# Patient Record
Sex: Male | Born: 1935 | Race: Black or African American | Hispanic: No | Marital: Married | Smoking: Former smoker
Health system: Southern US, Community
[De-identification: ages and names within clinical notes are randomized; demographics above are authoritative.]

## PROBLEM LIST (undated history)

## (undated) DIAGNOSIS — C801 Malignant (primary) neoplasm, unspecified: Secondary | ICD-10-CM

## (undated) DIAGNOSIS — I10 Essential (primary) hypertension: Secondary | ICD-10-CM

## (undated) DIAGNOSIS — E7800 Pure hypercholesterolemia, unspecified: Secondary | ICD-10-CM

## (undated) HISTORY — PX: PROSTATECTOMY: SHX69

---

## 2013-02-19 ENCOUNTER — Emergency Department (HOSPITAL_COMMUNITY): Payer: Medicare Other

## 2013-02-19 ENCOUNTER — Emergency Department (HOSPITAL_COMMUNITY)
Admission: EM | Admit: 2013-02-19 | Discharge: 2013-02-19 | Disposition: A | Discharge status: Home / Self Care | Payer: Medicare Other | Attending: Emergency Medicine | Admitting: Emergency Medicine

## 2013-02-19 ENCOUNTER — Encounter (HOSPITAL_COMMUNITY): Payer: Self-pay | Admitting: *Deleted

## 2013-02-19 DIAGNOSIS — Z862 Personal history of diseases of the blood and blood-forming organs and certain disorders involving the immune mechanism: Secondary | ICD-10-CM | POA: Insufficient documentation

## 2013-02-19 DIAGNOSIS — M545 Low back pain, unspecified: Secondary | ICD-10-CM | POA: Insufficient documentation

## 2013-02-19 DIAGNOSIS — M549 Dorsalgia, unspecified: Secondary | ICD-10-CM

## 2013-02-19 DIAGNOSIS — Z8639 Personal history of other endocrine, nutritional and metabolic disease: Secondary | ICD-10-CM | POA: Insufficient documentation

## 2013-02-19 DIAGNOSIS — R259 Unspecified abnormal involuntary movements: Secondary | ICD-10-CM | POA: Insufficient documentation

## 2013-02-19 DIAGNOSIS — IMO0001 Reserved for inherently not codable concepts without codable children: Secondary | ICD-10-CM | POA: Insufficient documentation

## 2013-02-19 DIAGNOSIS — Z87891 Personal history of nicotine dependence: Secondary | ICD-10-CM | POA: Insufficient documentation

## 2013-02-19 DIAGNOSIS — I10 Essential (primary) hypertension: Secondary | ICD-10-CM | POA: Insufficient documentation

## 2013-02-19 DIAGNOSIS — Z8546 Personal history of malignant neoplasm of prostate: Secondary | ICD-10-CM | POA: Insufficient documentation

## 2013-02-19 HISTORY — DX: Essential (primary) hypertension: I10

## 2013-02-19 HISTORY — DX: Pure hypercholesterolemia, unspecified: E78.00

## 2013-02-19 HISTORY — DX: Malignant (primary) neoplasm, unspecified: C80.1

## 2013-02-19 LAB — CBC WITH DIFFERENTIAL/PLATELET
Basophils Absolute: 0 10*3/uL (ref 0.0–0.1)
Basophils Relative: 0 % (ref 0–1)
Eosinophils Relative: 3 % (ref 0–5)
HCT: 33.3 % — ABNORMAL LOW (ref 39.0–52.0)
Lymphocytes Relative: 25 % (ref 12–46)
MCHC: 32.1 g/dL (ref 30.0–36.0)
MCV: 82.8 fL (ref 78.0–100.0)
Monocytes Absolute: 0.3 10*3/uL (ref 0.1–1.0)
RDW: 14.9 % (ref 11.5–15.5)

## 2013-02-19 LAB — COMPREHENSIVE METABOLIC PANEL
AST: 21 U/L (ref 0–37)
CO2: 30 mEq/L (ref 19–32)
Calcium: 9.3 mg/dL (ref 8.4–10.5)
Creatinine, Ser: 1.37 mg/dL — ABNORMAL HIGH (ref 0.50–1.35)
GFR calc non Af Amer: 47 mL/min — ABNORMAL LOW (ref >90)

## 2013-02-19 MED ORDER — TRAMADOL HCL 50 MG PO TABS: 50.0000 mg | ORAL_TABLET | Freq: Four times a day (QID) | ORAL | Status: DC | PRN

## 2013-02-19 NOTE — ED Notes (Signed)
Pt c/o lower back pain that radiates down left leg that started a few days ago, denies any injury, states that he did fall yesterday due to his left leg "giving away".

## 2013-02-19 NOTE — ED Notes (Signed)
Dr Zammit at bedside,  

## 2013-02-19 NOTE — ED Provider Notes (Signed)
CSN: [id number]     Arrival date & time 02/19/13  1054 History  This chart was scribed for Benny Lennert, MD by Quintella Reichert, ED scribe.  This patient was seen in room APA07/APA07 and the patient's care was started at 11:29 AM.    Chief Complaint  Patient presents with  . Leg Pain    Patient is a 79 y.o. male presenting with leg pain. The history is provided by the patient. No language interpreter was used.  Leg Pain Location:  Leg Time since incident:  2 weeks Injury: no   Leg location:  L leg Pain details:    Severity:  Moderate Chronicity:  New Relieved by:  Acetaminophen (minimal) Ineffective treatments:  None tried Associated symptoms: no back pain and no fatigue     HPI Comments: Douglas Mckay is a 79 y.o. male with h/o HTN, hypercholesteremia and prostate cancer who presents to the Emergency Department complaining of 2 weeks of moderate left leg pain extending from the back of his hip down the posterior leg to the ankle. Pt denies any injuries that may have caused pain.  He denies back pain.  He is ambulatory and denies any difficulty walking.  He has been taking Tylenol which provides minimal temporary relief.  He saw his PCP 3 days ago and states he thinks he was given x-rays but he is not sure.  Pt also complains of mild tremor to the right hand that began one month ago.   PCP is Dr. Felecia Shelling   Past Medical History  Diagnosis Date  . Hypertension   . Hypercholesterolemia   . Cancer     prostate    Past Surgical History  Procedure Laterality Date  . Prostatectomy      History reviewed. No pertinent family history.   History  Substance Use Topics  . Smoking status: Former Games developer  . Smokeless tobacco: Not on file  . Alcohol Use: No     Review of Systems  Constitutional: Negative for appetite change and fatigue.  HENT: Negative for congestion, sinus pressure and ear discharge.   Eyes: Negative for discharge.  Respiratory: Negative for cough.    Cardiovascular: Negative for chest pain.  Gastrointestinal: Negative for abdominal pain and diarrhea.  Genitourinary: Negative for frequency and hematuria.  Musculoskeletal: Positive for myalgias and arthralgias. Negative for back pain.  Skin: Negative for rash.  Neurological: Negative for seizures and headaches.  Psychiatric/Behavioral: Negative for hallucinations.      Allergies  Review of patient's allergies indicates no known allergies.  Home Medications  No current outpatient prescriptions on file.  BP 132/79  Pulse 92  Temp(Src) 99 F (37.2 C) (Oral)  Resp 17  Ht 5\' 1"  (1.549 m)  Wt 129 lb (58.514 kg)  BMI 24.39 kg/m2  SpO2 100%  Physical Exam  Nursing note and vitals reviewed. Constitutional: He is oriented to person, place, and time. He appears well-developed.  HENT:  Head: Normocephalic.  Eyes: Conjunctivae and EOM are normal. No scleral icterus.  Neck: Neck supple. No thyromegaly present.  Cardiovascular: Normal rate and regular rhythm.  Exam reveals no gallop and no friction rub.   No murmur heard. Pulmonary/Chest: No stridor. He has no wheezes. He has no rales. He exhibits no tenderness.  Abdominal: He exhibits no distension. There is no tenderness. There is no rebound.  Musculoskeletal: Normal range of motion. He exhibits no edema.       Lumbar back: He exhibits tenderness.  Mild lumbar spine tenderness  Lymphadenopathy:    He has no cervical adenopathy.  Neurological: He is oriented to person, place, and time. He displays tremor. Coordination normal.  Minimal tremor to right hand  Skin: No rash noted. No erythema.  Psychiatric: He has a normal mood and affect. His behavior is normal.    ED Course  Procedures (including critical care time)  DIAGNOSTIC STUDIES: Oxygen Saturation is 100% on room air, normal by my interpretation.    COORDINATION OF CARE: 11:36 AM-Discussed treatment plan which includes imaging and labs with pt at bedside and pt  agreed to plan.    Labs Review Labs Reviewed  CBC WITH DIFFERENTIAL - Abnormal; Notable for the following:    WBC 3.6 (*)    RBC 4.02 (*)    Hemoglobin 10.7 (*)    HCT 33.3 (*)    All other components within normal limits  COMPREHENSIVE METABOLIC PANEL - Abnormal; Notable for the following:    Glucose, Bld 100 (*)    Creatinine, Ser 1.37 (*)    GFR calc non Af Amer 47 (*)    GFR calc Af Amer 55 (*)    All other components within normal limits    Imaging Review Dg Lumbar Spine Complete  02/19/2013   *RADIOLOGY REPORT*  Clinical Data: Pain without injury  LUMBAR SPINE - COMPLETE 4+ VIEW  Comparison: None  Findings: Five lumbar type vertebral bodies are well visualized. Vertebral body height is well-maintained.  No spondylolysis or spondylolisthesis is seen.  Disc space narrowing is noted at L2-3 with mild osteophytes.  No other focal abnormality is seen.  IMPRESSION: Mild degenerative change without acute abnormality.   Original Report Authenticated By: Alcide Clever, M.D.   Ct Head Wo Contrast  02/19/2013   *RADIOLOGY REPORT*  Clinical Data: Weakness  CT HEAD WITHOUT CONTRAST  Technique:  Contiguous axial images were obtained from the base of the skull through the vertex without contrast.  Comparison: None.  Findings: The bony calvarium is intact.  No findings to suggest acute hemorrhage, acute infarction or space-occupying mass lesion are noted.  IMPRESSION: No acute abnormality is noted.   Original Report Authenticated By: Alcide Clever, M.D.    MDM  No diagnosis found.  Back pain with sciatica   The chart was scribed for me under my direct supervision.  I personally performed the history, physical, and medical decision making and all procedures in the evaluation of this patient.Benny Lennert, MD 02/19/13 [phone]

## 2013-02-19 NOTE — ED Notes (Signed)
Pain in L leg began approximately 2 weeks ago.  Described as spasm type pain beginning a base of buttocks radiating to ankle.  No c/o lower back pain.  8/10 pain at worst.  Has taken Tylenol intermittently which helps minimally w/pain.  Fell yesterday in home after L leg "gave way".  Did not hit head, did hit back.

## 2013-07-24 ENCOUNTER — Inpatient Hospital Stay (HOSPITAL_COMMUNITY)
Admission: EM | Admit: 2013-07-24 | Discharge: 2013-08-04 | DRG: 388 | Disposition: A | Discharge status: Skilled Nursing Facility | Payer: Medicare Other | Attending: Internal Medicine | Admitting: Internal Medicine

## 2013-07-24 ENCOUNTER — Encounter (HOSPITAL_COMMUNITY): Payer: Self-pay | Admitting: Emergency Medicine

## 2013-07-24 DIAGNOSIS — K5900 Constipation, unspecified: Secondary | ICD-10-CM | POA: Diagnosis present

## 2013-07-24 DIAGNOSIS — G250 Essential tremor: Secondary | ICD-10-CM | POA: Diagnosis present

## 2013-07-24 DIAGNOSIS — Z8546 Personal history of malignant neoplasm of prostate: Secondary | ICD-10-CM

## 2013-07-24 DIAGNOSIS — E538 Deficiency of other specified B group vitamins: Secondary | ICD-10-CM | POA: Diagnosis present

## 2013-07-24 DIAGNOSIS — N179 Acute kidney failure, unspecified: Secondary | ICD-10-CM

## 2013-07-24 DIAGNOSIS — R061 Stridor: Secondary | ICD-10-CM | POA: Diagnosis not present

## 2013-07-24 DIAGNOSIS — I10 Essential (primary) hypertension: Secondary | ICD-10-CM | POA: Diagnosis present

## 2013-07-24 DIAGNOSIS — E7800 Pure hypercholesterolemia, unspecified: Secondary | ICD-10-CM | POA: Diagnosis present

## 2013-07-24 DIAGNOSIS — K922 Gastrointestinal hemorrhage, unspecified: Secondary | ICD-10-CM

## 2013-07-24 DIAGNOSIS — D649 Anemia, unspecified: Secondary | ICD-10-CM

## 2013-07-24 DIAGNOSIS — G92 Toxic encephalopathy: Secondary | ICD-10-CM | POA: Diagnosis not present

## 2013-07-24 DIAGNOSIS — Z8711 Personal history of peptic ulcer disease: Secondary | ICD-10-CM

## 2013-07-24 DIAGNOSIS — J9601 Acute respiratory failure with hypoxia: Secondary | ICD-10-CM

## 2013-07-24 DIAGNOSIS — G929 Unspecified toxic encephalopathy: Secondary | ICD-10-CM | POA: Diagnosis not present

## 2013-07-24 DIAGNOSIS — Z9079 Acquired absence of other genital organ(s): Secondary | ICD-10-CM

## 2013-07-24 DIAGNOSIS — T380X5A Adverse effect of glucocorticoids and synthetic analogues, initial encounter: Secondary | ICD-10-CM | POA: Diagnosis not present

## 2013-07-24 DIAGNOSIS — E860 Dehydration: Secondary | ICD-10-CM | POA: Diagnosis present

## 2013-07-24 DIAGNOSIS — J690 Pneumonitis due to inhalation of food and vomit: Secondary | ICD-10-CM

## 2013-07-24 DIAGNOSIS — J9600 Acute respiratory failure, unspecified whether with hypoxia or hypercapnia: Secondary | ICD-10-CM | POA: Diagnosis not present

## 2013-07-24 DIAGNOSIS — Z823 Family history of stroke: Secondary | ICD-10-CM

## 2013-07-24 DIAGNOSIS — K5669 Other intestinal obstruction: Secondary | ICD-10-CM

## 2013-07-24 DIAGNOSIS — K565 Intestinal adhesions [bands], unspecified as to partial versus complete obstruction: Principal | ICD-10-CM | POA: Diagnosis present

## 2013-07-24 DIAGNOSIS — Z87891 Personal history of nicotine dependence: Secondary | ICD-10-CM

## 2013-07-24 DIAGNOSIS — K56609 Unspecified intestinal obstruction, unspecified as to partial versus complete obstruction: Secondary | ICD-10-CM | POA: Diagnosis present

## 2013-07-24 DIAGNOSIS — I959 Hypotension, unspecified: Secondary | ICD-10-CM | POA: Diagnosis not present

## 2013-07-24 DIAGNOSIS — E870 Hyperosmolality and hypernatremia: Secondary | ICD-10-CM | POA: Diagnosis not present

## 2013-07-24 DIAGNOSIS — G9340 Encephalopathy, unspecified: Secondary | ICD-10-CM

## 2013-07-24 DIAGNOSIS — G252 Other specified forms of tremor: Secondary | ICD-10-CM

## 2013-07-24 DIAGNOSIS — E785 Hyperlipidemia, unspecified: Secondary | ICD-10-CM | POA: Diagnosis present

## 2013-07-24 LAB — CBC WITH DIFFERENTIAL/PLATELET
BASOS ABS: 0 10*3/uL (ref 0.0–0.1)
BASOS PCT: 0 % (ref 0–1)
EOS ABS: 0 10*3/uL (ref 0.0–0.7)
EOS PCT: 0 % (ref 0–5)
HEMATOCRIT: 35.6 % — AB (ref 39.0–52.0)
HEMOGLOBIN: 11.5 g/dL — AB (ref 13.0–17.0)
Lymphocytes Relative: 26 % (ref 12–46)
Lymphs Abs: 0.8 10*3/uL (ref 0.7–4.0)
MCH: 27.3 pg (ref 26.0–34.0)
MCHC: 32.3 g/dL (ref 30.0–36.0)
MCV: 84.4 fL (ref 78.0–100.0)
MONO ABS: 0.2 10*3/uL (ref 0.1–1.0)
MONOS PCT: 6 % (ref 3–12)
Neutro Abs: 2 10*3/uL (ref 1.7–7.7)
Neutrophils Relative %: 68 % (ref 43–77)
Platelets: 188 10*3/uL (ref 150–400)
RBC: 4.22 MIL/uL (ref 4.22–5.81)
RDW: 14.8 % (ref 11.5–15.5)
WBC: 3 10*3/uL — ABNORMAL LOW (ref 4.0–10.5)

## 2013-07-24 LAB — LIPASE, BLOOD: LIPASE: 59 U/L (ref 11–59)

## 2013-07-24 LAB — COMPREHENSIVE METABOLIC PANEL
ALBUMIN: 4.2 g/dL (ref 3.5–5.2)
ALT: 19 U/L (ref 0–53)
AST: 26 U/L (ref 0–37)
Alkaline Phosphatase: 68 U/L (ref 39–117)
BILIRUBIN TOTAL: 0.2 mg/dL — AB (ref 0.3–1.2)
BUN: 29 mg/dL — AB (ref 6–23)
CALCIUM: 9.2 mg/dL (ref 8.4–10.5)
CO2: 30 mEq/L (ref 19–32)
CREATININE: 1.37 mg/dL — AB (ref 0.50–1.35)
Chloride: 102 mEq/L (ref 96–112)
GFR calc Af Amer: 55 mL/min — ABNORMAL LOW (ref >90)
GFR calc non Af Amer: 47 mL/min — ABNORMAL LOW (ref >90)
Glucose, Bld: 154 mg/dL — ABNORMAL HIGH (ref 70–99)
Potassium: 3.8 mEq/L (ref 3.7–5.3)
Sodium: 145 mEq/L (ref 137–147)
TOTAL PROTEIN: 7.7 g/dL (ref 6.0–8.3)

## 2013-07-24 LAB — LACTIC ACID, PLASMA: Lactic Acid, Venous: 2 mmol/L (ref 0.5–2.2)

## 2013-07-24 MED ORDER — SODIUM CHLORIDE 0.9 % IV SOLN
INTRAVENOUS | Status: DC
  Administered 2013-07-24: 1000 mL via INTRAVENOUS

## 2013-07-24 MED ORDER — FENTANYL CITRATE 0.05 MG/ML IJ SOLN
100.0000 ug | Freq: Once | INTRAMUSCULAR | Status: AC
  Administered 2013-07-24: 100 ug via INTRAVENOUS
  Filled 2013-07-24: qty 2

## 2013-07-24 NOTE — ED Notes (Addendum)
Pt c/o abdominal pain, headache, trembling, and both knees feel weak since Monday. Pt denies dysuria. Pts last BM was last Friday over a week ago.

## 2013-07-24 NOTE — ED Provider Notes (Addendum)
CSN: [id number]     Arrival date & time 07/24/13  2200 History  This chart was scribed for Wynetta Fines, MD by Maree Erie, ED Scribe. The patient was seen in room APA07/APA07. Patient's care was started at 11:09 PM.    Chief Complaint  Patient presents with  . Abdominal Pain    The history is provided by the patient. No language interpreter was used.    HPI Comments: Douglas Mckay is a 80 y.o. male who presents to the Emergency Department complaining of constant, diffuse abdominal pain that began yesterday but gradually worsened today. He characterizes the pain as aching. He denies any aggravating or alleviating factors. He reports intermittent nausea. He denies abdominal disten vomiting, diarrhea or fever. His last BM was two days ago. He denies similar instances of pain.      Past Medical History  Diagnosis Date  . Hypertension   . Hypercholesterolemia   . Cancer     prostate   Past Surgical History  Procedure Laterality Date  . Prostatectomy     History reviewed. No pertinent family history. History  Substance Use Topics  . Smoking status: Former Research scientist (life sciences)  . Smokeless tobacco: Not on file  . Alcohol Use: No    Review of Systems A complete 10 system review of systems was obtained and all systems are negative except as noted in the HPI and PMH.    Allergies  Review of patient's allergies indicates no known allergies.  Home Medications   Current Outpatient Rx  Name  Route  Sig  Dispense  Refill  . amLODipine-benazepril (LOTREL) 5-40 MG per capsule   Oral   Take 1 capsule by mouth daily.         . carvedilol (COREG) 3.125 MG tablet   Oral   Take 3.125 mg by mouth daily.         . simvastatin (ZOCOR) 20 MG tablet   Oral   Take 20 mg by mouth every evening.          Triage Vitals: BP 164/96  Pulse 92  Temp(Src) 98.4 F (36.9 C) (Oral)  Resp 20  Ht 5\' 1"  (1.549 m)  Wt 135 lb (61.236 kg)  BMI 25.52 kg/m2  SpO2 100%  Physical Exam  Nursing  note and vitals reviewed. General: Well-developed, well-nourished male in no acute distress; appearance consistent with age of record HENT: normocephalic; atraumatic; mucous membranes moist; wearing dentures; Eyes: pupils equal, round and reactive to light; extraocular muscles intact; arcus senilis bilaterally Neck: supple Heart: regular rate and rhythm; no murmurs, rubs or gallops Lungs: clear to auscultation bilaterally Abdomen: soft; no masses or hepatosplenomegaly; bowel sounds present; mild lower abdominal tenderness; slightly distended; bowel sounds present but intermittent Extremities: No deformity; full range of motion; pulses normal; no edema Neurologic: Awake, alert and oriented; motor function intact in all extremities and symmetric; no facial droop; occasional tremor of right hand Skin: Warm and dry Psychiatric: Normal mood and affect   ED Course  Procedures (including critical care time)  DIAGNOSTIC STUDIES: Oxygen Saturation is 100% on room air, normal by my interpretation.    COORDINATION OF CARE: 11:15 PM -Waiting on lab results to determine if CT abdomen will be with or without contrast. Patient verbalizes understanding and agrees with treatment plan.   MDM   Nursing notes and vitals signs, including pulse oximetry, reviewed.  Summary of this visit's results, reviewed by myself:  Labs:  Results for orders placed during the hospital encounter of  07/24/13 (from the past 24 hour(s))  LACTIC ACID, PLASMA     Status: None   Collection Time    07/24/13 10:55 PM      Result Value Range   Lactic Acid, Venous 2.0  0.5 - 2.2 mmol/L  CBC WITH DIFFERENTIAL     Status: Abnormal   Collection Time    07/24/13 10:55 PM      Result Value Range   WBC 3.0 (*) 4.0 - 10.5 K/uL   RBC 4.22  4.22 - 5.81 MIL/uL   Hemoglobin 11.5 (*) 13.0 - 17.0 g/dL   HCT 35.6 (*) 39.0 - 52.0 %   MCV 84.4  78.0 - 100.0 fL   MCH 27.3  26.0 - 34.0 pg   MCHC 32.3  30.0 - 36.0 g/dL   RDW 14.8   11.5 - 15.5 %   Platelets 188  150 - 400 K/uL   Neutrophils Relative % 68  43 - 77 %   Neutro Abs 2.0  1.7 - 7.7 K/uL   Lymphocytes Relative 26  12 - 46 %   Lymphs Abs 0.8  0.7 - 4.0 K/uL   Monocytes Relative 6  3 - 12 %   Monocytes Absolute 0.2  0.1 - 1.0 K/uL   Eosinophils Relative 0  0 - 5 %   Eosinophils Absolute 0.0  0.0 - 0.7 K/uL   Basophils Relative 0  0 - 1 %   Basophils Absolute 0.0  0.0 - 0.1 K/uL  COMPREHENSIVE METABOLIC PANEL     Status: Abnormal   Collection Time    07/24/13 10:55 PM      Result Value Range   Sodium 145  137 - 147 mEq/L   Potassium 3.8  3.7 - 5.3 mEq/L   Chloride 102  96 - 112 mEq/L   CO2 30  19 - 32 mEq/L   Glucose, Bld 154 (*) 70 - 99 mg/dL   BUN 29 (*) 6 - 23 mg/dL   Creatinine, Ser 1.37 (*) 0.50 - 1.35 mg/dL   Calcium 9.2  8.4 - 10.5 mg/dL   Total Protein 7.7  6.0 - 8.3 g/dL   Albumin 4.2  3.5 - 5.2 g/dL   AST 26  0 - 37 U/L   ALT 19  0 - 53 U/L   Alkaline Phosphatase 68  39 - 117 U/L   Total Bilirubin 0.2 (*) 0.3 - 1.2 mg/dL   GFR calc non Af Amer 47 (*) >90 mL/min   GFR calc Af Amer 55 (*) >90 mL/min  LIPASE, BLOOD     Status: None   Collection Time    07/24/13 10:55 PM      Result Value Range   Lipase 59  11 - 59 U/L    Imaging Studies: Ct Abdomen Pelvis W Contrast  07/25/2013   CLINICAL DATA:  New onset left lower abdominal pain. History of prostate cancer.  EXAM: CT ABDOMEN AND PELVIS WITH CONTRAST  TECHNIQUE: Multidetector CT imaging of the abdomen and pelvis was performed using the standard protocol following bolus administration of intravenous contrast.  CONTRAST:  51mL OMNIPAQUE IOHEXOL 300 MG/ML SOLN, 153mL OMNIPAQUE IOHEXOL 300 MG/ML SOLN  COMPARISON:  None available for comparison at time of study interpretation.  FINDINGS: Included view of the lung bases demonstrates mild dependent atelectasis. Included heart and pericardium are nonsuspicious.  Multiple loops of fluid distended small bowel measure up to 5.6 cm with transition  point in a right lower quadrant associated with a surgical  anastomotic line. No pneumatosis. Small amount of air and stool in the large bowel. Stomach is distended with debris and contrast.  Small amount of ascites without drainable fluid collections. No intraperitoneal free air. Mild mesenteric edema.  Subcentimeter hypodensities in the liver may reflect cysts, the liver is otherwise unremarkable. The spleen, adrenal glands, pancreas and gallbladder are nonsuspicious.  10 mm cyst at left interpolar kidney, too small to characterize hypodensities in the lower pole the kidneys bilaterally. No nephrolithiasis, hydronephrosis or renal masses. Great vessels are normal in course and caliber with moderate calcific atherosclerosis. Status post prostatectomy. Urinary bladder is partially distended with mild circumferential wall thickening.  Moderate right inguinal hernia containing fat and fluid. Mild lumbar levoscoliosis and degenerative change.  IMPRESSION: High-grade small bowel obstruction with transition point in the right lower quadrant associated with surgical anastomotic suture, this may reflect adhesions. No bowel perforation.  Small amount of ascites without drainable fluid collections.   Electronically Signed   By: Elon Alas   On: 07/25/2013 02:18   2:37 AM Dr. Arnoldo Morale will consult. He requests that the hospitalist admit the patient. We'll place an NG tube and make patient NPO.   I personally performed the services described in this documentation, which was scribed in my presence.  The recorded information has been reviewed and considered.   Wynetta Fines, MD 07/25/13 [place], MD 07/25/13 [phone]

## 2013-07-24 NOTE — ED Notes (Signed)
Wife states he had BM 2 days ago, had sudden onset of acute pain 2pm

## 2013-07-25 ENCOUNTER — Inpatient Hospital Stay (HOSPITAL_COMMUNITY): Payer: Medicare Other

## 2013-07-25 ENCOUNTER — Encounter (HOSPITAL_COMMUNITY): Payer: Self-pay | Admitting: Internal Medicine

## 2013-07-25 ENCOUNTER — Emergency Department (HOSPITAL_COMMUNITY): Payer: Medicare Other

## 2013-07-25 DIAGNOSIS — D649 Anemia, unspecified: Secondary | ICD-10-CM

## 2013-07-25 DIAGNOSIS — E7800 Pure hypercholesterolemia, unspecified: Secondary | ICD-10-CM

## 2013-07-25 DIAGNOSIS — K922 Gastrointestinal hemorrhage, unspecified: Secondary | ICD-10-CM

## 2013-07-25 DIAGNOSIS — R259 Unspecified abnormal involuntary movements: Secondary | ICD-10-CM

## 2013-07-25 DIAGNOSIS — I10 Essential (primary) hypertension: Secondary | ICD-10-CM

## 2013-07-25 DIAGNOSIS — E860 Dehydration: Secondary | ICD-10-CM

## 2013-07-25 DIAGNOSIS — K56609 Unspecified intestinal obstruction, unspecified as to partial versus complete obstruction: Secondary | ICD-10-CM

## 2013-07-25 LAB — URINALYSIS, ROUTINE W REFLEX MICROSCOPIC
Bilirubin Urine: NEGATIVE
Glucose, UA: NEGATIVE mg/dL
KETONES UR: NEGATIVE mg/dL
LEUKOCYTES UA: NEGATIVE
NITRITE: NEGATIVE
PH: 5.5 (ref 5.0–8.0)
Protein, ur: 30 mg/dL — AB
SPECIFIC GRAVITY, URINE: 1.025 (ref 1.005–1.030)
Urobilinogen, UA: 0.2 mg/dL (ref 0.0–1.0)

## 2013-07-25 LAB — CBC
HCT: 31.8 % — ABNORMAL LOW (ref 39.0–52.0)
HEMOGLOBIN: 10.2 g/dL — AB (ref 13.0–17.0)
MCH: 27.1 pg (ref 26.0–34.0)
MCHC: 32.1 g/dL (ref 30.0–36.0)
MCV: 84.4 fL (ref 78.0–100.0)
Platelets: 148 10*3/uL — ABNORMAL LOW (ref 150–400)
RBC: 3.77 MIL/uL — ABNORMAL LOW (ref 4.22–5.81)
RDW: 14.8 % (ref 11.5–15.5)
WBC: 3.9 10*3/uL — ABNORMAL LOW (ref 4.0–10.5)

## 2013-07-25 LAB — PROTIME-INR
INR: 1.12 (ref 0.00–1.49)
Prothrombin Time: 14.2 seconds (ref 11.6–15.2)

## 2013-07-25 LAB — URINE MICROSCOPIC-ADD ON

## 2013-07-25 LAB — SAMPLE TO BLOOD BANK

## 2013-07-25 LAB — MRSA PCR SCREENING: MRSA by PCR: NEGATIVE

## 2013-07-25 LAB — APTT: aPTT: 29 seconds (ref 24–37)

## 2013-07-25 MED ORDER — ACETAMINOPHEN 650 MG RE SUPP
650.0000 mg | Freq: Four times a day (QID) | RECTAL | Status: DC | PRN
  Administered 2013-07-29: 650 mg via RECTAL
  Filled 2013-07-25: qty 1

## 2013-07-25 MED ORDER — HYDRALAZINE HCL 20 MG/ML IJ SOLN
10.0000 mg | Freq: Four times a day (QID) | INTRAMUSCULAR | Status: DC | PRN
  Administered 2013-07-27: 10 mg via INTRAVENOUS
  Filled 2013-07-25: qty 1

## 2013-07-25 MED ORDER — IOHEXOL 300 MG/ML  SOLN
50.0000 mL | Freq: Once | INTRAMUSCULAR | Status: AC | PRN
  Administered 2013-07-25: 50 mL via ORAL

## 2013-07-25 MED ORDER — PANTOPRAZOLE SODIUM 40 MG IV SOLR
INTRAVENOUS | Status: AC
  Filled 2013-07-25: qty 80

## 2013-07-25 MED ORDER — SODIUM CHLORIDE 0.9 % IJ SOLN
3.0000 mL | Freq: Two times a day (BID) | INTRAMUSCULAR | Status: DC
  Administered 2013-07-25 – 2013-08-04 (×12): 3 mL via INTRAVENOUS

## 2013-07-25 MED ORDER — ACETAMINOPHEN 325 MG PO TABS
650.0000 mg | ORAL_TABLET | Freq: Four times a day (QID) | ORAL | Status: DC | PRN
  Administered 2013-07-27: 650 mg via ORAL
  Filled 2013-07-25: qty 2

## 2013-07-25 MED ORDER — LORAZEPAM 2 MG/ML IJ SOLN
0.5000 mg | Freq: Once | INTRAMUSCULAR | Status: AC
  Administered 2013-07-25: 0.5 mg via INTRAVENOUS

## 2013-07-25 MED ORDER — IOHEXOL 300 MG/ML  SOLN
100.0000 mL | Freq: Once | INTRAMUSCULAR | Status: AC | PRN
  Administered 2013-07-25: 100 mL via INTRAVENOUS

## 2013-07-25 MED ORDER — ONDANSETRON HCL 4 MG PO TABS: 4.0000 mg | ORAL_TABLET | Freq: Four times a day (QID) | ORAL | Status: DC | PRN

## 2013-07-25 MED ORDER — SODIUM CHLORIDE 0.9 % IV SOLN
80.0000 mg | Freq: Once | INTRAVENOUS | Status: AC
  Administered 2013-07-25: 80 mg via INTRAVENOUS
  Filled 2013-07-25: qty 80

## 2013-07-25 MED ORDER — DEXTROSE-NACL 5-0.45 % IV SOLN
INTRAVENOUS | Status: DC
Start: 2013-07-25 — End: 2013-07-30
  Administered 2013-07-25: 1000 mL via INTRAVENOUS

## 2013-07-25 MED ORDER — ALBUTEROL SULFATE (2.5 MG/3ML) 0.083% IN NEBU
2.5000 mg | INHALATION_SOLUTION | RESPIRATORY_TRACT | Status: DC | PRN
  Administered 2013-07-27 – 2013-07-31 (×5): 2.5 mg via RESPIRATORY_TRACT
  Filled 2013-07-25 (×5): qty 3

## 2013-07-25 MED ORDER — LORAZEPAM 2 MG/ML IJ SOLN
INTRAMUSCULAR | Status: AC
  Filled 2013-07-25: qty 1

## 2013-07-25 MED ORDER — FENTANYL CITRATE 0.05 MG/ML IJ SOLN
100.0000 ug | Freq: Once | INTRAMUSCULAR | Status: AC
  Administered 2013-07-25: 100 ug via INTRAVENOUS
  Filled 2013-07-25: qty 2

## 2013-07-25 MED ORDER — ONDANSETRON HCL 4 MG/2ML IJ SOLN: 4.0000 mg | Freq: Four times a day (QID) | INTRAMUSCULAR | Status: DC | PRN

## 2013-07-25 MED ORDER — HYDROMORPHONE HCL PF 1 MG/ML IJ SOLN
0.5000 mg | INTRAMUSCULAR | Status: DC | PRN
  Administered 2013-07-26 – 2013-07-27 (×2): 0.5 mg via INTRAVENOUS
  Filled 2013-07-25 (×2): qty 1

## 2013-07-25 MED ORDER — PANTOPRAZOLE SODIUM 40 MG IV SOLR
40.0000 mg | Freq: Two times a day (BID) | INTRAVENOUS | Status: DC
  Administered 2013-07-25 – 2013-07-29 (×8): 40 mg via INTRAVENOUS
  Filled 2013-07-25 (×8): qty 40

## 2013-07-25 NOTE — Care Management Note (Signed)
    Page 1 of 1   07/25/2013     2:51:10 PM   CARE MANAGEMENT NOTE 07/25/2013  Patient:  Douglas Mckay, Douglas Mckay   Account Number:  [id number]  Date Initiated:  07/25/2013  Documentation initiated by:  Theophilus Kinds  Subjective/Objective Assessment:   Pt admitted from home with SBO. Pt lives with his wife and will return home at discharge. Pt has been fairly independent with ADl's.     Action/Plan:   Will continue to follow for discharge planning needs.   Anticipated DC Date:  08/01/2013   Anticipated DC Plan:  [place]  CM consult      Choice offered to / List presented to:             Status of service:  Completed, signed off Medicare Important Message given?   (If response is "NO", the following Medicare IM given date fields will be blank) Date Medicare IM given:   Date Additional Medicare IM given:    Discharge Disposition:  HOME/SELF CARE  Per UR Regulation:    If discussed at Long Length of Stay Meetings, dates discussed:    Comments:  07/25/13 [place], RN BSN CM

## 2013-07-25 NOTE — Consult Note (Addendum)
Reason for Consult:GI bleed Referring Physician: Hospitalist services Binh Doten is an 80 y.o. male. (Wife is providing most of history). Primary Care Physician;  Dr Valerie Salts HPI: Admitted last night thru the ED. He apparently had abdominal and back pain.  There was no vomiting associated with his symptoms. He has had pain for about a week on and off.  No fever. Wife states he has had some abdominal distention. His last BM was 3 days ago and was small and black.  His appetite has been okay at home. There has been no weight loss.  Hx of hypertension for years. Hx of hyperlipidemia.  Per wife, patient has a remote hx of PUD. Hx of prostate cancer and underwent surgery 3 yrs ago at [facility].  In the ED, NG placed and he was noted to have dark blood in the tube. 500cc coffee ground emesis. This am, very little emesis in suction container: ? 2-3 cc.   Hx of prostate cancer.  Past Medical History  Diagnosis Date  . Hypertension   . Hypercholesterolemia   . Cancer     prostate    Past Surgical History  Procedure Laterality Date  . Prostatectomy      Family History  Problem Relation Age of Onset  . Stroke Mother     Social History:  reports that he has quit smoking. He does not have any smokeless tobacco history on file. He reports that he does not drink alcohol or use illicit drugs.  Allergies: No Known Allergies  Medications: I have reviewed the patient's current medications.  Results for orders placed during the hospital encounter of 07/24/13 (from the past 48 hour(s))  LACTIC ACID, PLASMA     Status: None   Collection Time    07/24/13 10:55 PM      Result Value Range   Lactic Acid, Venous 2.0  0.5 - 2.2 mmol/L  CBC WITH DIFFERENTIAL     Status: Abnormal   Collection Time    07/24/13 10:55 PM      Result Value Range   WBC 3.0 (*) 4.0 - 10.5 K/uL   RBC 4.22  4.22 - 5.81 MIL/uL   Hemoglobin 11.5 (*) 13.0 - 17.0 g/dL   HCT 35.6 (*) 39.0 - 52.0 %   MCV 84.4  78.0 - 100.0  fL   MCH 27.3  26.0 - 34.0 pg   MCHC 32.3  30.0 - 36.0 g/dL   RDW 14.8  11.5 - 15.5 %   Platelets 188  150 - 400 K/uL   Neutrophils Relative % 68  43 - 77 %   Neutro Abs 2.0  1.7 - 7.7 K/uL   Lymphocytes Relative 26  12 - 46 %   Lymphs Abs 0.8  0.7 - 4.0 K/uL   Monocytes Relative 6  3 - 12 %   Monocytes Absolute 0.2  0.1 - 1.0 K/uL   Eosinophils Relative 0  0 - 5 %   Eosinophils Absolute 0.0  0.0 - 0.7 K/uL   Basophils Relative 0  0 - 1 %   Basophils Absolute 0.0  0.0 - 0.1 K/uL  COMPREHENSIVE METABOLIC PANEL     Status: Abnormal   Collection Time    07/24/13 10:55 PM      Result Value Range   Sodium 145  137 - 147 mEq/L   Potassium 3.8  3.7 - 5.3 mEq/L   Chloride 102  96 - 112 mEq/L   CO2 30  19 - 32  mEq/L   Glucose, Bld 154 (*) 70 - 99 mg/dL   BUN 29 (*) 6 - 23 mg/dL   Creatinine, Ser 1.37 (*) 0.50 - 1.35 mg/dL   Calcium 9.2  8.4 - 10.5 mg/dL   Total Protein 7.7  6.0 - 8.3 g/dL   Albumin 4.2  3.5 - 5.2 g/dL   AST 26  0 - 37 U/L   ALT 19  0 - 53 U/L   Alkaline Phosphatase 68  39 - 117 U/L   Total Bilirubin 0.2 (*) 0.3 - 1.2 mg/dL   GFR calc non Af Amer 47 (*) >90 mL/min   GFR calc Af Amer 55 (*) >90 mL/min   Comment: (NOTE)     The eGFR has been calculated using the CKD EPI equation.     This calculation has not been validated in all clinical situations.     eGFR's persistently <90 mL/min signify possible Chronic Kidney     Disease.  LIPASE, BLOOD     Status: None   Collection Time    07/24/13 10:55 PM      Result Value Range   Lipase 59  11 - 59 U/L  URINALYSIS, ROUTINE W REFLEX MICROSCOPIC     Status: Abnormal   Collection Time    07/25/13  3:30 AM      Result Value Range   Color, Urine YELLOW  YELLOW   APPearance CLEAR  CLEAR   Specific Gravity, Urine 1.025  1.005 - 1.030   pH 5.5  5.0 - 8.0   Glucose, UA NEGATIVE  NEGATIVE mg/dL   Hgb urine dipstick TRACE (*) NEGATIVE   Bilirubin Urine NEGATIVE  NEGATIVE   Ketones, ur NEGATIVE  NEGATIVE mg/dL   Protein,  ur 30 (*) NEGATIVE mg/dL   Urobilinogen, UA 0.2  0.0 - 1.0 mg/dL   Nitrite NEGATIVE  NEGATIVE   Leukocytes, UA NEGATIVE  NEGATIVE  URINE MICROSCOPIC-ADD ON     Status: None   Collection Time    07/25/13  3:30 AM      Result Value Range   Squamous Epithelial / LPF RARE  RARE   WBC, UA 0-2  <3 WBC/hpf   RBC / HPF 0-2  <3 RBC/hpf   Bacteria, UA RARE  RARE  SAMPLE TO BLOOD BANK     Status: None   Collection Time    07/25/13  5:30 AM      Result Value Range   Blood Bank Specimen SAMPLE AVAILABLE FOR TESTING     Sample Expiration 07/26/2013    CBC     Status: Abnormal   Collection Time    07/25/13  5:37 AM      Result Value Range   WBC 3.9 (*) 4.0 - 10.5 K/uL   RBC 3.77 (*) 4.22 - 5.81 MIL/uL   Hemoglobin 10.2 (*) 13.0 - 17.0 g/dL   HCT 31.8 (*) 39.0 - 52.0 %   MCV 84.4  78.0 - 100.0 fL   MCH 27.1  26.0 - 34.0 pg   MCHC 32.1  30.0 - 36.0 g/dL   RDW 14.8  11.5 - 15.5 %   Platelets 148 (*) 150 - 400 K/uL  PROTIME-INR     Status: None   Collection Time    07/25/13  5:37 AM      Result Value Range   Prothrombin Time 14.2  11.6 - 15.2 seconds   INR 1.12  0.00 - 1.49  APTT     Status: None  Collection Time    07/25/13  5:37 AM      Result Value Range   aPTT 29  24 - 37 seconds    Ct Head Wo Contrast  07/25/2013   CLINICAL DATA:  Headache and tremors.  EXAM: CT HEAD WITHOUT CONTRAST  TECHNIQUE: Contiguous axial images were obtained from the base of the skull through the vertex without intravenous contrast.  COMPARISON:  02/19/2013  FINDINGS: Examination is limited by mild motion degradation and recent administration of intravenous contrast.  Skull and Sinuses:No significant abnormality.  Orbits: No acute abnormality.  Brain: No evidence of acute abnormality, such as acute infarction, hemorrhage, hydrocephalus, or mass lesion/mass effect. Unchanged symmetric thickening of the free edge of the tentorium.  IMPRESSION: Stable exam.  No acute intracranial abnormality.   Electronically  Signed   By: Jorje Guild M.D.   On: 07/25/2013 04:17   Ct Abdomen Pelvis W Contrast  07/25/2013   CLINICAL DATA:  New onset left lower abdominal pain. History of prostate cancer.  EXAM: CT ABDOMEN AND PELVIS WITH CONTRAST  TECHNIQUE: Multidetector CT imaging of the abdomen and pelvis was performed using the standard protocol following bolus administration of intravenous contrast.  CONTRAST:  51m OMNIPAQUE IOHEXOL 300 MG/ML SOLN, 1076mOMNIPAQUE IOHEXOL 300 MG/ML SOLN  COMPARISON:  None available for comparison at time of study interpretation.  FINDINGS: Included view of the lung bases demonstrates mild dependent atelectasis. Included heart and pericardium are nonsuspicious.  Multiple loops of fluid distended small bowel measure up to 5.6 cm with transition point in a right lower quadrant associated with a surgical anastomotic line. No pneumatosis. Small amount of air and stool in the large bowel. Stomach is distended with debris and contrast.  Small amount of ascites without drainable fluid collections. No intraperitoneal free air. Mild mesenteric edema.  Subcentimeter hypodensities in the liver may reflect cysts, the liver is otherwise unremarkable. The spleen, adrenal glands, pancreas and gallbladder are nonsuspicious.  10 mm cyst at left interpolar kidney, too small to characterize hypodensities in the lower pole the kidneys bilaterally. No nephrolithiasis, hydronephrosis or renal masses. Great vessels are normal in course and caliber with moderate calcific atherosclerosis. Status post prostatectomy. Urinary bladder is partially distended with mild circumferential wall thickening.  Moderate right inguinal hernia containing fat and fluid. Mild lumbar levoscoliosis and degenerative change.  IMPRESSION: High-grade small bowel obstruction with transition point in the right lower quadrant associated with surgical anastomotic suture, this may reflect adhesions. No bowel perforation.  Small amount of ascites  without drainable fluid collections.   Electronically Signed   By: CoElon Alas On: 07/25/2013 02:18    ROS Blood pressure 103/71, pulse 99, temperature 98.4 F (36.9 C), temperature source Axillary, resp. rate 10, height _0  (1.549 m), weight 117 lb 11.6 oz (53.4 kg), SpO2 100.00%. Physical Exam Alert. Skin warm and dry. Lungs are clear. HR regular. Abdomen is not tense. Slightly distended.  BS hyperactive. There is no pain on palpitation. No edema to lower extremities.  Assessment/Plan: Coffee ground return noted on placement of NG tube for decompression of small bowel obstruction; 500 cc in the ED. Very little this am.  He has dropped his Hemoglobin by 1gm.  I discussed with Dr. ReLaural Golden PUD or esophagitis needs to be ruled out. Will plan EGD tomorrow.  Agree with Protonix BID.  SETZER,TERRI W 07/25/2013, 8:17 AM     GI attending note; Patient interviewed and examined. Patient is [age]ear old African male who presents  with small bowel obstruction. NG tube was placed and emergency room with coffee-ground return. Patient denies nausea vomiting or heaving prior to coming to emergency room. He does give a remote history of peptic ulcer disease but he does not take any NSAIDs. Suspect upper GI bleed separate issue may be the result of secondary reflux disease due to small bowel obstruction. He does not appear to be actively bleeding. He will need diagnostic EGD during this hospitalization. Patient is by far also concerned about tremors to both hands which started about a month ago. Unenhanced head CT on admission negative for acute abnormalities.

## 2013-07-25 NOTE — Progress Notes (Addendum)
[facility] PROGRESS NOTE  Quinnton Bury CHE:[id number] DOB: Aug 26, 1931 DOA: 07/24/2013 PCP: Marjo Bicker, MD  Brief narrative: 80 y.o. male with a past medical history of hypertension, hypercholesterolemia, prostate cancer, status post prostatectomy in the past who presents to [facility] ED 07/24/2013 with ongoing worsening abdominal pain for one week prior to this admission. No complaints of nausea or vomiting. Patient was subsequently found to have high-grade small bowel obstruction based on CT abdomen. NG tube was inserted for decompression purposes. GI and surgery are assisting management.  Assessment/Plan:  Principal Problem:   SBO (small bowel obstruction) - Likely secondary to adhesions. - Continue conservative management with NG tube for decompression, IV fluids, antiemetics if needed - Appreciate surgery and GI following. Per surgery, intervention not required at this time Active Problems:   HTN (hypertension), benign - Reasonable inpatient control. Blood pressure 103/71   Hypercholesteremia - Off of statin therapy as patient is n.p.o.   Dehydration - Secondary to small bowel obstruction. Continue IV fluids   Code Status: Full code Family Communication: Family at the bedside, patient's wife Disposition Plan: Remains inpatient  Leisa Lenz, MD  [facility] Pager [phone]  If 7PM-7AM, please contact night-coverage [email or web address] Password [email or web address] 07/25/2013, 10:25 AM   LOS: 1 day   Consultants:  Surgery  Gastroenterology  Procedures:  NG tube placement on admission  Antibiotics:  None  HPI/Subjective: No acute overnight events.  Objective: Filed Vitals:   07/25/13 0618 07/25/13 0700 07/25/13 0800 07/25/13 0824  BP: 128/66 112/71 103/71   Pulse: 99     Temp: 98.4 F (36.9 C)   98.2 F (36.8 C)  TempSrc: Axillary   Axillary  Resp:  11 10   Height: 5\' 1"  (1.549 m)     Weight: 53.4 kg (117 lb 11.6 oz)     SpO2: 100%       Intake/Output  Summary (Last 24 hours) at 07/25/13 1025 Last data filed at 07/25/13 0454  Gross per 24 hour  Intake   1000 ml  Output    800 ml  Net    200 ml    Exam:   General:  Pt is alert, no acute distress  Cardiovascular: Regular rate and rhythm, S1/S2 appreciated  Respiratory: Clear to auscultation bilaterally, no wheezing, no crackles, no rhonchi  Abdomen: Firm, distended, and G-tube in place, appreciated bowel sounds   Extremities: No edema, pulses DP and PT palpable bilaterally  Neuro: Grossly nonfocal  Data Reviewed: Basic Metabolic Panel:  Recent Labs Lab 07/24/13 2255  NA 145  K 3.8  CL 102  CO2 30  GLUCOSE 154*  BUN 29*  CREATININE 1.37*  CALCIUM 9.2   Liver Function Tests:  Recent Labs Lab 07/24/13 2255  AST 26  ALT 19  ALKPHOS 68  BILITOT 0.2*  PROT 7.7  ALBUMIN 4.2    Recent Labs Lab 07/24/13 2255  LIPASE 59   No results found for this basename: AMMONIA,  in the last 168 hours CBC:  Recent Labs Lab 07/24/13 2255 07/25/13 0537  WBC 3.0* 3.9*  NEUTROABS 2.0  --   HGB 11.5* 10.2*  HCT 35.6* 31.8*  MCV 84.4 84.4  PLT 188 148*   Cardiac Enzymes: No results found for this basename: CKTOTAL, CKMB, CKMBINDEX, TROPONINI,  in the last 168 hours BNP: No components found with this basename: POCBNP,  CBG: No results found for this basename: GLUCAP,  in the last 168 hours  MRSA PCR SCREENING     Status: None  Collection Time    07/25/13  5:57 AM      Result Value Range Status   MRSA by PCR NEGATIVE  NEGATIVE Final     Studies: Ct Head Wo Contrast 07/25/2013    IMPRESSION: Stable exam.  No acute intracranial abnormality.    Ct Abdomen Pelvis W Contrast 07/25/2013     IMPRESSION: High-grade small bowel obstruction with transition point in the right lower quadrant associated with surgical anastomotic suture, this may reflect adhesions. No bowel perforation.  Small amount of ascites without drainable fluid collections.     Scheduled Meds: .  pantoprazole  40 mg Intravenous Q12H   Continuous Infusions: . dextrose 5 % and 0.45% NaCl 1,000 mL (07/25/13 0538)

## 2013-07-25 NOTE — Plan of Care (Signed)
Problem: Diagnosis - Type of Surgery Goal: General Surgical Patient Education (See Patient Education module for education specifics) Outcome: Progressing Admitted with small bowel obstruction  Problem: Consults Goal: Nutrition Consult-if indicated Outcome: Progressing NPO at present

## 2013-07-25 NOTE — Progress Notes (Signed)
UR chart review completed.  

## 2013-07-25 NOTE — ED Notes (Signed)
Please disregard O2 sats of 73%   Sat probe is off finger. Correct sats are 93% on r/a increased to 99% on 2ln/c

## 2013-07-25 NOTE — ED Notes (Signed)
Dr Maryland Pink in with pt, upgraded to stepdown bed.

## 2013-07-25 NOTE — Consult Note (Signed)
Reason for Consult: Small bowel obstruction Referring Physician: [facility]  Douglas Mckay is an 80 y.o. male.  HPI: Patient is a 80 year old black male who presented emergency room with a two-day history of worsening constipation. History is obtained from his wife as the patient is sedated from pain medication. She states he last had a bowel movement 2 days ago. He has never had this episode before. He usually receives care in [place]. He had a prostatectomy done in the room several years ago.  Past Medical History  Diagnosis Date  . Hypertension   . Hypercholesterolemia   . Cancer     prostate    Past Surgical History  Procedure Laterality Date  . Prostatectomy      Family History  Problem Relation Age of Onset  . Stroke Mother     Social History:  reports that he has quit smoking. He does not have any smokeless tobacco history on file. He reports that he does not drink alcohol or use illicit drugs.  Allergies: No Known Allergies  Medications: I have reviewed the patient's current medications.  Results for orders placed during the hospital encounter of 07/24/13 (from the past 48 hour(s))  LACTIC ACID, PLASMA     Status: None   Collection Time    07/24/13 10:55 PM      Result Value Range   Lactic Acid, Venous 2.0  0.5 - 2.2 mmol/L  CBC WITH DIFFERENTIAL     Status: Abnormal   Collection Time    07/24/13 10:55 PM      Result Value Range   WBC 3.0 (*) 4.0 - 10.5 K/uL   RBC 4.22  4.22 - 5.81 MIL/uL   Hemoglobin 11.5 (*) 13.0 - 17.0 g/dL   HCT 35.6 (*) 39.0 - 52.0 %   MCV 84.4  78.0 - 100.0 fL   MCH 27.3  26.0 - 34.0 pg   MCHC 32.3  30.0 - 36.0 g/dL   RDW 14.8  11.5 - 15.5 %   Platelets 188  150 - 400 K/uL   Neutrophils Relative % 68  43 - 77 %   Neutro Abs 2.0  1.7 - 7.7 K/uL   Lymphocytes Relative 26  12 - 46 %   Lymphs Abs 0.8  0.7 - 4.0 K/uL   Monocytes Relative 6  3 - 12 %   Monocytes Absolute 0.2  0.1 - 1.0 K/uL   Eosinophils Relative 0   0 - 5 %   Eosinophils Absolute 0.0  0.0 - 0.7 K/uL   Basophils Relative 0  0 - 1 %   Basophils Absolute 0.0  0.0 - 0.1 K/uL  COMPREHENSIVE METABOLIC PANEL     Status: Abnormal   Collection Time    07/24/13 10:55 PM      Result Value Range   Sodium 145  137 - 147 mEq/L   Potassium 3.8  3.7 - 5.3 mEq/L   Chloride 102  96 - 112 mEq/L   CO2 30  19 - 32 mEq/L   Glucose, Bld 154 (*) 70 - 99 mg/dL   BUN 29 (*) 6 - 23 mg/dL   Creatinine, Ser 1.37 (*) 0.50 - 1.35 mg/dL   Calcium 9.2  8.4 - 10.5 mg/dL   Total Protein 7.7  6.0 - 8.3 g/dL   Albumin 4.2  3.5 - 5.2 g/dL   AST 26  0 - 37 U/L   ALT 19  0 - 53 U/L   Alkaline Phosphatase 68  39 - 117 U/L   Total Bilirubin 0.2 (*) 0.3 - 1.2 mg/dL   GFR calc non Af Amer 47 (*) >90 mL/min   GFR calc Af Amer 55 (*) >90 mL/min   Comment: (NOTE)     The eGFR has been calculated using the CKD EPI equation.     This calculation has not been validated in all clinical situations.     eGFR's persistently <90 mL/min signify possible Chronic Kidney     Disease.  LIPASE, BLOOD     Status: None   Collection Time    07/24/13 10:55 PM      Result Value Range   Lipase 59  11 - 59 U/L  URINALYSIS, ROUTINE W REFLEX MICROSCOPIC     Status: Abnormal   Collection Time    07/25/13  3:30 AM      Result Value Range   Color, Urine YELLOW  YELLOW   APPearance CLEAR  CLEAR   Specific Gravity, Urine 1.025  1.005 - 1.030   pH 5.5  5.0 - 8.0   Glucose, UA NEGATIVE  NEGATIVE mg/dL   Hgb urine dipstick TRACE (*) NEGATIVE   Bilirubin Urine NEGATIVE  NEGATIVE   Ketones, ur NEGATIVE  NEGATIVE mg/dL   Protein, ur 30 (*) NEGATIVE mg/dL   Urobilinogen, UA 0.2  0.0 - 1.0 mg/dL   Nitrite NEGATIVE  NEGATIVE   Leukocytes, UA NEGATIVE  NEGATIVE  URINE MICROSCOPIC-ADD ON     Status: None   Collection Time    07/25/13  3:30 AM      Result Value Range   Squamous Epithelial / LPF RARE  RARE   WBC, UA 0-2  <3 WBC/hpf   RBC / HPF 0-2  <3 RBC/hpf   Bacteria, UA RARE  RARE   SAMPLE TO BLOOD BANK     Status: None   Collection Time    07/25/13  5:30 AM      Result Value Range   Blood Bank Specimen SAMPLE AVAILABLE FOR TESTING     Sample Expiration 07/26/2013    CBC     Status: Abnormal   Collection Time    07/25/13  5:37 AM      Result Value Range   WBC 3.9 (*) 4.0 - 10.5 K/uL   RBC 3.77 (*) 4.22 - 5.81 MIL/uL   Hemoglobin 10.2 (*) 13.0 - 17.0 g/dL   HCT 31.8 (*) 39.0 - 52.0 %   MCV 84.4  78.0 - 100.0 fL   MCH 27.1  26.0 - 34.0 pg   MCHC 32.1  30.0 - 36.0 g/dL   RDW 14.8  11.5 - 15.5 %   Platelets 148 (*) 150 - 400 K/uL  PROTIME-INR     Status: None   Collection Time    07/25/13  5:37 AM      Result Value Range   Prothrombin Time 14.2  11.6 - 15.2 seconds   INR 1.12  0.00 - 1.49  APTT     Status: None   Collection Time    07/25/13  5:37 AM      Result Value Range   aPTT 29  24 - 37 seconds  MRSA PCR SCREENING     Status: None   Collection Time    07/25/13  5:57 AM      Result Value Range   MRSA by PCR NEGATIVE  NEGATIVE   Comment:            The GeneXpert MRSA Assay (FDA  approved for NASAL specimens     only), is one component of a     comprehensive MRSA colonization     surveillance program. It is not     intended to diagnose MRSA     infection nor to guide or     monitor treatment for     MRSA infections.    Ct Head Wo Contrast  07/25/2013   CLINICAL DATA:  Headache and tremors.  EXAM: CT HEAD WITHOUT CONTRAST  TECHNIQUE: Contiguous axial images were obtained from the base of the skull through the vertex without intravenous contrast.  COMPARISON:  02/19/2013  FINDINGS: Examination is limited by mild motion degradation and recent administration of intravenous contrast.  Skull and Sinuses:No significant abnormality.  Orbits: No acute abnormality.  Brain: No evidence of acute abnormality, such as acute infarction, hemorrhage, hydrocephalus, or mass lesion/mass effect. Unchanged symmetric thickening of the free edge of the tentorium.   IMPRESSION: Stable exam.  No acute intracranial abnormality.   Electronically Signed   By: Jorje Guild M.D.   On: 07/25/2013 04:17   Ct Abdomen Pelvis W Contrast  07/25/2013   CLINICAL DATA:  New onset left lower abdominal pain. History of prostate cancer.  EXAM: CT ABDOMEN AND PELVIS WITH CONTRAST  TECHNIQUE: Multidetector CT imaging of the abdomen and pelvis was performed using the standard protocol following bolus administration of intravenous contrast.  CONTRAST:  71m OMNIPAQUE IOHEXOL 300 MG/ML SOLN, 1061mOMNIPAQUE IOHEXOL 300 MG/ML SOLN  COMPARISON:  None available for comparison at time of study interpretation.  FINDINGS: Included view of the lung bases demonstrates mild dependent atelectasis. Included heart and pericardium are nonsuspicious.  Multiple loops of fluid distended small bowel measure up to 5.6 cm with transition point in a right lower quadrant associated with a surgical anastomotic line. No pneumatosis. Small amount of air and stool in the large bowel. Stomach is distended with debris and contrast.  Small amount of ascites without drainable fluid collections. No intraperitoneal free air. Mild mesenteric edema.  Subcentimeter hypodensities in the liver may reflect cysts, the liver is otherwise unremarkable. The spleen, adrenal glands, pancreas and gallbladder are nonsuspicious.  10 mm cyst at left interpolar kidney, too small to characterize hypodensities in the lower pole the kidneys bilaterally. No nephrolithiasis, hydronephrosis or renal masses. Great vessels are normal in course and caliber with moderate calcific atherosclerosis. Status post prostatectomy. Urinary bladder is partially distended with mild circumferential wall thickening.  Moderate right inguinal hernia containing fat and fluid. Mild lumbar levoscoliosis and degenerative change.  IMPRESSION: High-grade small bowel obstruction with transition point in the right lower quadrant associated with surgical anastomotic suture,  this may reflect adhesions. No bowel perforation.  Small amount of ascites without drainable fluid collections.   Electronically Signed   By: CoElon Alas On: 07/25/2013 02:18    ROS: See chart Blood pressure 103/71, pulse 99, temperature 98.2 F (36.8 C), temperature source Axillary, resp. rate 10, height _0  (1.549 m), weight 53.4 kg (117 lb 11.6 oz), SpO2 100.00%. Physical Exam: Sedate black male in no acute distress. Abdomen is significantly distended, but soft. Occasional bowel sounds appreciated. Lower midline incision noted. No rigidity noted. No ventral hernias appreciated. Rectal examination deferred at this time.  Assessment/Plan: Impression: Small bowel obstruction most likely secondary to adhesive disease Plan: Agree with continued NG tube decompression. GI is involved concerning the patient's blood per NG tube. We'll closely monitor with you. No need for acute surgical intervention at this  time.  Cashlyn Huguley A 07/25/2013, 10:00 AM

## 2013-07-25 NOTE — H&P (Addendum)
[facility] History and Physical  Janzen Sacks WER:[id number] DOB: 12-28-1931 DOA: 07/24/2013   PCP: Marjo Bicker, MD  Specialists: None  Chief Complaint: Abdominal pain for a week  HPI: Douglas Mckay is a 80 y.o. male with a past medical history of hypertension, hypercholesterolemia, prostate cancer, status post prostatectomy in the past who presents to the hospital with complaints of abdominal pain, ongoing for one week. Unfortunately, patient received multiple doses of fentanyl and as a result is very drowsy. His wife was at the, bedside and she provided some history but unfortunately, it was very limited. It appears that he's been having symptoms of abdominal pain for a week. But there has been no complaints of nausea or vomiting. He, apparently, has been eating and drinking. However, he hasn't had a bowel movement since Friday. Patient is noted to be quite tremulous especially with movement and his wife tells me that this has been ongoing for a month. She was unable to provide any more information. Apparently patient has also been having headaches, along with generalized weakness. So history is extremely limited at this time.  Home Medications: Prior to Admission medications   Medication Sig Start Date End Date Taking? Authorizing Provider  amLODipine-benazepril (LOTREL) 5-40 MG per capsule Take 1 capsule by mouth daily.   Yes Historical Provider, MD  carvedilol (COREG) 3.125 MG tablet Take 3.125 mg by mouth daily.   Yes Historical Provider, MD  simvastatin (ZOCOR) 20 MG tablet Take 20 mg by mouth every evening.   Yes Historical Provider, MD    Allergies: No Known Allergies  Past Medical History: Past Medical History  Diagnosis Date  . Hypertension   . Hypercholesterolemia   . Cancer     prostate    Past Surgical History  Procedure Laterality Date  . Prostatectomy      Social History: Patient lives with his wife in [place], [place]. There is no history of  smoking, alcohol or illicit drug use. Apparently, he has been independent with ambulation. This is all per his wife.  Family History:  Family History  Problem Relation Age of Onset  . Stroke Mother      Review of Systems - unable to obtain due to to drowsiness secondary to medications  Physical Examination  Filed Vitals:   07/25/13 0130 07/25/13 0200 07/25/13 0230 07/25/13 0300  BP: 154/104 167/69 144/86 162/116  Pulse: 87 98 98 93  Temp:      TempSrc:      Resp: _0 Height:      Weight:      SpO2: 100% 100% 100% 73%    General appearance: Sedated. arousable with painful stimuli. No distress. Head: Normocephalic, without obvious abnormality, atraumatic Eyes: conjunctivae/corneas clear. PERRL,. Throat: Dry mucous membranes Neck: no adenopathy, no carotid bruit, no JVD, supple, symmetrical, trachea midline and thyroid not enlarged, symmetric, no tenderness/mass/nodules Resp: clear to auscultation bilaterally Cardio: regular rate and rhythm, S1, S2 normal, no murmur, click, rub or gallop GI: Abdomen is distended. Tenderness is present diffusely, without any rebound, rigidity, or guarding. No masses, or organomegaly. Bowel sounds are absent. Extremities: extremities normal, atraumatic, no cyanosis or edema Pulses: 2+ and symmetric Skin: Skin color, texture, turgor normal. No rashes or lesions Lymph nodes: Cervical, supraclavicular, and axillary nodes normal. Neurologic: He is sedated. Tremors noted predominantly in the right upper extremity. More so with the movement, and action. Not noted when he is at rest. No facial droop noted. Unable to do full neurological examination  is a patient is sedated. Plantars are downgoing bilaterally.  Laboratory Data: Results for orders placed during the hospital encounter of 07/24/13 (from the past 48 hour(s))  LACTIC ACID, PLASMA     Status: None   Collection Time    07/24/13 10:55 PM      Result Value Range   Lactic Acid, Venous  2.0  0.5 - 2.2 mmol/L  CBC WITH DIFFERENTIAL     Status: Abnormal   Collection Time    07/24/13 10:55 PM      Result Value Range   WBC 3.0 (*) 4.0 - 10.5 K/uL   RBC 4.22  4.22 - 5.81 MIL/uL   Hemoglobin 11.5 (*) 13.0 - 17.0 g/dL   HCT 35.6 (*) 39.0 - 52.0 %   MCV 84.4  78.0 - 100.0 fL   MCH 27.3  26.0 - 34.0 pg   MCHC 32.3  30.0 - 36.0 g/dL   RDW 14.8  11.5 - 15.5 %   Platelets 188  150 - 400 K/uL   Neutrophils Relative % 68  43 - 77 %   Neutro Abs 2.0  1.7 - 7.7 K/uL   Lymphocytes Relative 26  12 - 46 %   Lymphs Abs 0.8  0.7 - 4.0 K/uL   Monocytes Relative 6  3 - 12 %   Monocytes Absolute 0.2  0.1 - 1.0 K/uL   Eosinophils Relative 0  0 - 5 %   Eosinophils Absolute 0.0  0.0 - 0.7 K/uL   Basophils Relative 0  0 - 1 %   Basophils Absolute 0.0  0.0 - 0.1 K/uL  COMPREHENSIVE METABOLIC PANEL     Status: Abnormal   Collection Time    07/24/13 10:55 PM      Result Value Range   Sodium 145  137 - 147 mEq/L   Potassium 3.8  3.7 - 5.3 mEq/L   Chloride 102  96 - 112 mEq/L   CO2 30  19 - 32 mEq/L   Glucose, Bld 154 (*) 70 - 99 mg/dL   BUN 29 (*) 6 - 23 mg/dL   Creatinine, Ser 1.37 (*) 0.50 - 1.35 mg/dL   Calcium 9.2  8.4 - 10.5 mg/dL   Total Protein 7.7  6.0 - 8.3 g/dL   Albumin 4.2  3.5 - 5.2 g/dL   AST 26  0 - 37 U/L   ALT 19  0 - 53 U/L   Alkaline Phosphatase 68  39 - 117 U/L   Total Bilirubin 0.2 (*) 0.3 - 1.2 mg/dL   GFR calc non Af Amer 47 (*) >90 mL/min   GFR calc Af Amer 55 (*) >90 mL/min   Comment: (NOTE)     The eGFR has been calculated using the CKD EPI equation.     This calculation has not been validated in all clinical situations.     eGFR's persistently <90 mL/min signify possible Chronic Kidney     Disease.  LIPASE, BLOOD     Status: None   Collection Time    07/24/13 10:55 PM      Result Value Range   Lipase 59  11 - 59 U/L    Radiology Reports: Ct Abdomen Pelvis W Contrast  07/25/2013   CLINICAL DATA:  New onset left lower abdominal pain. History of  prostate cancer.  EXAM: CT ABDOMEN AND PELVIS WITH CONTRAST  TECHNIQUE: Multidetector CT imaging of the abdomen and pelvis was performed using the standard protocol following bolus administration of intravenous contrast.  CONTRAST:  34m OMNIPAQUE IOHEXOL 300 MG/ML SOLN, 1040mOMNIPAQUE IOHEXOL 300 MG/ML SOLN  COMPARISON:  None available for comparison at time of study interpretation.  FINDINGS: Included view of the lung bases demonstrates mild dependent atelectasis. Included heart and pericardium are nonsuspicious.  Multiple loops of fluid distended small bowel measure up to 5.6 cm with transition point in a right lower quadrant associated with a surgical anastomotic line. No pneumatosis. Small amount of air and stool in the large bowel. Stomach is distended with debris and contrast.  Small amount of ascites without drainable fluid collections. No intraperitoneal free air. Mild mesenteric edema.  Subcentimeter hypodensities in the liver may reflect cysts, the liver is otherwise unremarkable. The spleen, adrenal glands, pancreas and gallbladder are nonsuspicious.  10 mm cyst at left interpolar kidney, too small to characterize hypodensities in the lower pole the kidneys bilaterally. No nephrolithiasis, hydronephrosis or renal masses. Great vessels are normal in course and caliber with moderate calcific atherosclerosis. Status post prostatectomy. Urinary bladder is partially distended with mild circumferential wall thickening.  Moderate right inguinal hernia containing fat and fluid. Mild lumbar levoscoliosis and degenerative change.  IMPRESSION: High-grade small bowel obstruction with transition point in the right lower quadrant associated with surgical anastomotic suture, this may reflect adhesions. No bowel perforation.  Small amount of ascites without drainable fluid collections.   Electronically Signed   By: CoElon Alas On: 07/25/2013 02:18    Problem List  Principal Problem:   SBO (small bowel  obstruction) Active Problems:   HTN (hypertension), benign   Hypercholesteremia   Dehydration   Assessment: This is a [age]ear old, African American male, who presents with abdominal pain, tremors, headaches. He has undergone evaluation in the emergency department and was noted to have a high-grade small bowel obstruction. Etiology for his tremors is not entirely clear.   Plan: #1 high-grade small bowel obstruction: NG tube will be placed. General surgery has been consulted and they will evaluate the patient in the morning. We'll keep the patient n.p.o. Pain medications will be provided.  #2 action tremors, and headaches: Unfortunately, I am unable to get much information regarding the symptoms from the wife. We will proceed with a CT head to make, sure there is no acute intracranial abnormality. If CT head is negative further workup can be pursued as an outpatient. Unclear if any of his home medications are new for him. Unclear if he takes any over-the-counter medications. His LFTs are normal.  #3 history of hypertension: Monitor blood pressure closely, and treat as needed with parenteral agents.  #4 dehydration: IV fluids will be initiated.   DVT Prophylaxis: Heparin Code Status: Full code Family Communication: Discussed with his wife  Disposition Plan: Admit to telemetry   Further management decisions will depend on results of further testing and patient's response to treatment.  [facility] Pager [phone] 7PM-7AM, please contact night-coverage [email or web address] Password [email or web address]2/07/2013, 3:37 AM   After NG was placed dark fluid was suctioned. Some blood tinge is noted as well. Patient remains hemodynamically stable. Will repeat CBC. Will notify Gi as well as patient could be experiencing GI bleed as well. Will give PPI. Will admit to stepdown instead.  , 5:01 AM

## 2013-07-25 NOTE — ED Notes (Signed)
NGT return 500cc black heme positive. Dr Maryland Pink aware.pt still somnolent.

## 2013-07-26 LAB — COMPREHENSIVE METABOLIC PANEL
ALT: 16 U/L (ref 0–53)
AST: 25 U/L (ref 0–37)
Albumin: 3.4 g/dL — ABNORMAL LOW (ref 3.5–5.2)
Alkaline Phosphatase: 60 U/L (ref 39–117)
BUN: 15 mg/dL (ref 6–23)
CALCIUM: 8.6 mg/dL (ref 8.4–10.5)
CHLORIDE: 105 meq/L (ref 96–112)
CO2: 28 meq/L (ref 19–32)
Creatinine, Ser: 1.09 mg/dL (ref 0.50–1.35)
GFR calc Af Amer: 73 mL/min — ABNORMAL LOW (ref >90)
GFR, EST NON AFRICAN AMERICAN: 63 mL/min — AB (ref >90)
Glucose, Bld: 120 mg/dL — ABNORMAL HIGH (ref 70–99)
Potassium: 3.8 mEq/L (ref 3.7–5.3)
SODIUM: 143 meq/L (ref 137–147)
Total Bilirubin: 0.4 mg/dL (ref 0.3–1.2)
Total Protein: 6.6 g/dL (ref 6.0–8.3)

## 2013-07-26 LAB — CBC
HCT: 35.8 % — ABNORMAL LOW (ref 39.0–52.0)
HEMOGLOBIN: 11.5 g/dL — AB (ref 13.0–17.0)
MCH: 27 pg (ref 26.0–34.0)
MCHC: 32.1 g/dL (ref 30.0–36.0)
MCV: 84 fL (ref 78.0–100.0)
Platelets: 170 10*3/uL (ref 150–400)
RBC: 4.26 MIL/uL (ref 4.22–5.81)
RDW: 14.9 % (ref 11.5–15.5)
WBC: 4.6 10*3/uL (ref 4.0–10.5)

## 2013-07-26 NOTE — Progress Notes (Signed)
[facility] PROGRESS NOTE  Douglas Mckay ZOX:[id number] DOB: 02/22/1932 DOA: 07/24/2013 PCP: Marjo Bicker, MD  Brief narrative: 80 y.o. male with a past medical history of hypertension, hypercholesterolemia, prostate cancer, status post prostatectomy in the past who presents to [facility] 07/24/2013 with ongoing worsening abdominal pain for one week prior to this admission. No complaints of nausea or vomiting. Patient was subsequently found to have high-grade small bowel obstruction based on CT abdomen. NG tube was inserted for decompression purposes. GI and surgery are assisting management.   Assessment/Plan:   Principal Problem:  SBO (small bowel obstruction)  - Secondary to adhesions.  - Continue conservative management with NG tube for decompression, IV fluids, antiemetics if needed  - Appreciate surgery and GI following. Per surgery, surgical intervention not required at this time  Active Problems:  Coffee ground emesis - per GI will need EGD during this hospitalization - no further coffee ground emesis reported other then what was noted on NG tube insertion - hemoglobin stable at 11.5 HTN (hypertension), benign  - Reasonable inpatient control. Blood pressure 137/78 Hypercholesteremia  - Off of statin therapy as patient is n.p.o.  Dehydration  - Secondary to small bowel obstruction. Continue current IV fluids while NPO.  Code Status: Full code  Family Communication: Family at the bedside, patient's wife  Disposition Plan: Remains inpatient    Consultants:  Surgery  Gastroenterology Procedures:  NG tube placement on admission Antibiotics:  None   Leisa Lenz, MD  [facility] Pager [phone]  If 7PM-7AM, please contact night-coverage [email or web address] Password [facility] 07/26/2013, 6:56 AM   LOS: 2 days    HPI/Subjective: Says he feels better this am.  Objective: Filed Vitals:   07/26/13 0300 07/26/13 0400 07/26/13 0500 07/26/13 0600  BP: 136/91 167/102  143/94 137/78  Pulse:      Temp:  99.4 F (37.4 C)    TempSrc:  Oral    Resp: 13 16 15 15   Height:      Weight:   57.4 kg (126 lb 8.7 oz)   SpO2:        Intake/Output Summary (Last 24 hours) at 07/26/13 0656 Last data filed at 07/26/13 0600  Gross per 24 hour  Intake 2436.67 ml  Output      0 ml  Net 2436.67 ml    Exam:   General:  Pt is alert, follows commands appropriately, not in acute distress  Cardiovascular: Regular rate and rhythm, S1/S2 appreciated   Respiratory: Clear to auscultation bilaterally, no wheezing, no crackles, no rhonchi  Abdomen: firm, distended, bowel sounds present, no guarding  Extremities: No edema, pulses DP and PT palpable bilaterally  Neuro: Grossly nonfocal  Data Reviewed: Basic Metabolic Panel:  Recent Labs Lab 07/24/13 2255 07/26/13 0543  NA 145 143  K 3.8 3.8  CL 102 105  CO2 30 28  GLUCOSE 154* 120*  BUN 29* 15  CREATININE 1.37* 1.09  CALCIUM 9.2 8.6   Liver Function Tests:  Recent Labs Lab 07/24/13 2255 07/26/13 0543  AST 26 25  ALT 19 16  ALKPHOS 68 60  BILITOT 0.2* 0.4  PROT 7.7 6.6  ALBUMIN 4.2 3.4*    Recent Labs Lab 07/24/13 2255  LIPASE 59   No results found for this basename: AMMONIA,  in the last 168 hours CBC:  Recent Labs Lab 07/24/13 2255 07/25/13 0537 07/26/13 0543  WBC 3.0* 3.9* 4.6  NEUTROABS 2.0  --   --   HGB 11.5* 10.2* 11.5*  HCT 35.6* 31.8*  35.8*  MCV 84.4 84.4 84.0  PLT 188 148* 170   Cardiac Enzymes: No results found for this basename: CKTOTAL, CKMB, CKMBINDEX, TROPONINI,  in the last 168 hours BNP: No components found with this basename: POCBNP,  CBG: No results found for this basename: GLUCAP,  in the last 168 hours  MRSA PCR SCREENING     Status: None   Collection Time    07/25/13  5:57 AM      Result Value Range Status   MRSA by PCR NEGATIVE  NEGATIVE Final     Studies: Ct Head Wo Contrast 07/25/2013  IMPRESSION: Stable exam.  No acute intracranial abnormality.      Ct Abdomen Pelvis W Contrast 07/25/2013    IMPRESSION: High-grade small bowel obstruction with transition point in the right lower quadrant associated with surgical anastomotic suture, this may reflect adhesions. No bowel perforation.  Small amount of ascites without drainable fluid collections.      Scheduled Meds: . pantoprazole (PROTONIX) IV  40 mg Intravenous Q12H  . sodium chloride  3 mL Intravenous Q12H   Continuous Infusions: . dextrose 5 % and 0.45% NaCl 100 mL/hr at 07/26/13 0600

## 2013-07-26 NOTE — Progress Notes (Signed)
Subjective: Had a large bowel movement last night and feels significantly better.  Objective: Vital signs in last 24 hours: Temp:  [97.8 F (36.6 C)-100 F (37.8 C)] 97.8 F (36.6 C) (02/03 0800) Resp:  [13-21] 19 (02/03 1300) BP: (127-172)/(73-117) 153/98 mmHg (02/03 1300) Weight:  [57.4 kg (126 lb 8.7 oz)] 57.4 kg (126 lb 8.7 oz) (02/03 0500) Last BM Date: 07/26/13  Intake/Output from previous day: 02/02 0701 - 02/03 0700 In: 2436.7 [I.V.:2436.7] Out: -  Intake/Output this shift:    General appearance: alert, cooperative and no distress GI: soft, non-tender; bowel sounds normal; no masses,  no organomegaly  Lab Results:   Recent Labs  07/25/13 0537 07/26/13 0543  WBC 3.9* 4.6  HGB 10.2* 11.5*  HCT 31.8* 35.8*  PLT 148* 170   BMET  Recent Labs  07/24/13 2255 07/26/13 0543  NA 145 143  K 3.8 3.8  CL 102 105  CO2 30 28  GLUCOSE 154* 120*  BUN 29* 15  CREATININE 1.37* 1.09  CALCIUM 9.2 8.6   PT/INR  Recent Labs  07/25/13 0537  LABPROT 14.2  INR 1.12    Studies/Results: Ct Head Wo Contrast  07/25/2013   CLINICAL DATA:  Headache and tremors.  EXAM: CT HEAD WITHOUT CONTRAST  TECHNIQUE: Contiguous axial images were obtained from the base of the skull through the vertex without intravenous contrast.  COMPARISON:  02/19/2013  FINDINGS: Examination is limited by mild motion degradation and recent administration of intravenous contrast.  Skull and Sinuses:No significant abnormality.  Orbits: No acute abnormality.  Brain: No evidence of acute abnormality, such as acute infarction, hemorrhage, hydrocephalus, or mass lesion/mass effect. Unchanged symmetric thickening of the free edge of the tentorium.  IMPRESSION: Stable exam.  No acute intracranial abnormality.   Electronically Signed   By: Jorje Guild M.D.   On: 07/25/2013 04:17   Ct Abdomen Pelvis W Contrast  07/25/2013   CLINICAL DATA:  New onset left lower abdominal pain. History of prostate cancer.   EXAM: CT ABDOMEN AND PELVIS WITH CONTRAST  TECHNIQUE: Multidetector CT imaging of the abdomen and pelvis was performed using the standard protocol following bolus administration of intravenous contrast.  CONTRAST:  71mL OMNIPAQUE IOHEXOL 300 MG/ML SOLN, 138mL OMNIPAQUE IOHEXOL 300 MG/ML SOLN  COMPARISON:  None available for comparison at time of study interpretation.  FINDINGS: Included view of the lung bases demonstrates mild dependent atelectasis. Included heart and pericardium are nonsuspicious.  Multiple loops of fluid distended small bowel measure up to 5.6 cm with transition point in a right lower quadrant associated with a surgical anastomotic line. No pneumatosis. Small amount of air and stool in the large bowel. Stomach is distended with debris and contrast.  Small amount of ascites without drainable fluid collections. No intraperitoneal free air. Mild mesenteric edema.  Subcentimeter hypodensities in the liver may reflect cysts, the liver is otherwise unremarkable. The spleen, adrenal glands, pancreas and gallbladder are nonsuspicious.  10 mm cyst at left interpolar kidney, too small to characterize hypodensities in the lower pole the kidneys bilaterally. No nephrolithiasis, hydronephrosis or renal masses. Great vessels are normal in course and caliber with moderate calcific atherosclerosis. Status post prostatectomy. Urinary bladder is partially distended with mild circumferential wall thickening.  Moderate right inguinal hernia containing fat and fluid. Mild lumbar levoscoliosis and degenerative change.  IMPRESSION: High-grade small bowel obstruction with transition point in the right lower quadrant associated with surgical anastomotic suture, this may reflect adhesions. No bowel perforation.  Small amount of ascites  without drainable fluid collections.   Electronically Signed   By: Elon Alas   On: 07/25/2013 02:18    Anti-infectives: Anti-infectives   None       Assessment/Plan: Impression: Small bowel obstruction, resolving Plan: Will remove NG tube. To have EGD tomorrow by Dr. Laural Golden. No need for acute surgical intervention at this time.  Will also need colonoscopy in the near future as he has never had a colonoscopy.  LOS: 2 days    Douglas Mckay A 07/26/2013

## 2013-07-26 NOTE — Progress Notes (Signed)
Patient ID: Douglas Mckay, male   DOB: 01/01/1932, 80 y.o.   MRN: [id number] States he feels better. No output from NG. Coffe ground emesis in tubing. States he had a large BM this am.    Filed Vitals:   07/26/13 0300 07/26/13 0400 07/26/13 0500 07/26/13 0600  BP: 136/91 167/102 143/94 137/78  Pulse:      Temp:  99.4 F (37.4 C)    TempSrc:  Oral    Resp: 13 16 15 15   Height:      Weight:   126 lb 8.7 oz (57.4 kg)   SpO2:       CBC    Component Value Date/Time   WBC 4.6 07/26/2013 0543   RBC 4.26 07/26/2013 0543   HGB 11.5* 07/26/2013 0543   HCT 35.8* 07/26/2013 0543   PLT 170 07/26/2013 0543   MCV 84.0 07/26/2013 0543   MCH 27.0 07/26/2013 0543   MCHC 32.1 07/26/2013 0543   RDW 14.9 07/26/2013 0543   LYMPHSABS 0.8 07/24/2013 2255   MONOABS 0.2 07/24/2013 2255   EOSABS 0.0 07/24/2013 2255   BASOSABS 0.0 07/24/2013 2255     Abdomen is distended. Bowel sounds are present.  Assessment: Upper GI bleed. Remote hx of PUD in the past. Maintaining his hemoglobin. Will probably need an EGD before discharge.

## 2013-07-26 NOTE — Progress Notes (Signed)
Patient reports no abdominal pain. He denies nausea or vomiting. He says his abdominal distention has almost completely resolved. He had large bowel movement tonight and passed flatus. Abdominal exam reveals full abdomen with normal bowel sounds and is soft and much less distended. No tenderness noted. NG return 0 last 24 hours. Assessment; #1. Small bowel obstruction seemed to have resolved. She orgy felt to be adhesive disease. #2. Upper GI bleed. He had coffee-ground return on placement of NG tube for decompression of admission. H&H is below normal but stable. Recommendation; Trial with clear liquids leaving NG tube in place but clamped. EGD in a.m.Marland Kitchen

## 2013-07-27 ENCOUNTER — Inpatient Hospital Stay (HOSPITAL_COMMUNITY): Payer: Medicare Other

## 2013-07-27 DIAGNOSIS — K922 Gastrointestinal hemorrhage, unspecified: Secondary | ICD-10-CM

## 2013-07-27 DIAGNOSIS — G9340 Encephalopathy, unspecified: Secondary | ICD-10-CM

## 2013-07-27 DIAGNOSIS — J9601 Acute respiratory failure with hypoxia: Secondary | ICD-10-CM

## 2013-07-27 DIAGNOSIS — D649 Anemia, unspecified: Secondary | ICD-10-CM

## 2013-07-27 DIAGNOSIS — J9600 Acute respiratory failure, unspecified whether with hypoxia or hypercapnia: Secondary | ICD-10-CM

## 2013-07-27 DIAGNOSIS — R061 Stridor: Secondary | ICD-10-CM

## 2013-07-27 DIAGNOSIS — N179 Acute kidney failure, unspecified: Secondary | ICD-10-CM

## 2013-07-27 LAB — URINALYSIS, ROUTINE W REFLEX MICROSCOPIC
Bilirubin Urine: NEGATIVE
GLUCOSE, UA: NEGATIVE mg/dL
HGB URINE DIPSTICK: NEGATIVE
Ketones, ur: NEGATIVE mg/dL
LEUKOCYTES UA: NEGATIVE
Nitrite: NEGATIVE
PH: 5 (ref 5.0–8.0)
PROTEIN: NEGATIVE mg/dL
SPECIFIC GRAVITY, URINE: 1.015 (ref 1.005–1.030)
Urobilinogen, UA: 0.2 mg/dL (ref 0.0–1.0)

## 2013-07-27 LAB — COMPREHENSIVE METABOLIC PANEL
ALK PHOS: 67 U/L (ref 39–117)
ALT: 16 U/L (ref 0–53)
AST: 28 U/L (ref 0–37)
Albumin: 3.6 g/dL (ref 3.5–5.2)
BILIRUBIN TOTAL: 0.4 mg/dL (ref 0.3–1.2)
BUN: 15 mg/dL (ref 6–23)
CHLORIDE: 100 meq/L (ref 96–112)
CO2: 26 meq/L (ref 19–32)
Calcium: 8.8 mg/dL (ref 8.4–10.5)
Creatinine, Ser: 1.19 mg/dL (ref 0.50–1.35)
GFR, EST AFRICAN AMERICAN: 65 mL/min — AB (ref >90)
GFR, EST NON AFRICAN AMERICAN: 56 mL/min — AB (ref >90)
GLUCOSE: 139 mg/dL — AB (ref 70–99)
POTASSIUM: 3.9 meq/L (ref 3.7–5.3)
Sodium: 140 mEq/L (ref 137–147)
Total Protein: 7.4 g/dL (ref 6.0–8.3)

## 2013-07-27 LAB — BLOOD GAS, ARTERIAL
ACID-BASE EXCESS: 0.2 mmol/L (ref 0.0–2.0)
Bicarbonate: 25 mEq/L — ABNORMAL HIGH (ref 20.0–24.0)
DRAWN BY: 317771
FIO2: 1 %
O2 SAT: 95.3 %
PO2 ART: 81.4 mmHg (ref 80.0–100.0)
Patient temperature: 37
TCO2: 23.1 mmol/L (ref 0–100)
pCO2 arterial: 45.6 mmHg — ABNORMAL HIGH (ref 35.0–45.0)
pH, Arterial: 7.358 (ref 7.350–7.450)

## 2013-07-27 LAB — CBC
HCT: 36.8 % — ABNORMAL LOW (ref 39.0–52.0)
Hemoglobin: 12 g/dL — ABNORMAL LOW (ref 13.0–17.0)
MCH: 27 pg (ref 26.0–34.0)
MCHC: 32.6 g/dL (ref 30.0–36.0)
MCV: 82.7 fL (ref 78.0–100.0)
Platelets: 176 10*3/uL (ref 150–400)
RBC: 4.45 MIL/uL (ref 4.22–5.81)
RDW: 14.6 % (ref 11.5–15.5)
WBC: 6.9 10*3/uL (ref 4.0–10.5)

## 2013-07-27 LAB — CORTISOL: CORTISOL PLASMA: 15.1 ug/dL

## 2013-07-27 LAB — LACTIC ACID, PLASMA: LACTIC ACID, VENOUS: 2 mmol/L (ref 0.5–2.2)

## 2013-07-27 MED ORDER — MAGNESIUM HYDROXIDE 400 MG/5ML PO SUSP
30.0000 mL | Freq: Two times a day (BID) | ORAL | Status: DC
  Administered 2013-07-27 – 2013-08-04 (×5): 30 mL via ORAL
  Filled 2013-07-27 (×7): qty 30

## 2013-07-27 MED ORDER — FUROSEMIDE 10 MG/ML IJ SOLN
40.0000 mg | Freq: Once | INTRAMUSCULAR | Status: AC
  Administered 2013-07-27: 40 mg via INTRAVENOUS
  Filled 2013-07-27: qty 4

## 2013-07-27 MED ORDER — IOHEXOL 300 MG/ML  SOLN
70.0000 mL | Freq: Once | INTRAMUSCULAR | Status: AC | PRN
  Administered 2013-07-27: 70 mL via INTRAVENOUS

## 2013-07-27 MED ORDER — ACETAMINOPHEN 325 MG PO TABS: 650.0000 mg | ORAL_TABLET | Freq: Once | ORAL | Status: DC

## 2013-07-27 MED ORDER — RACEPINEPHRINE HCL 2.25 % IN NEBU
INHALATION_SOLUTION | RESPIRATORY_TRACT | Status: AC
  Filled 2013-07-27: qty 0.5

## 2013-07-27 MED ORDER — FAMOTIDINE IN NACL 20-0.9 MG/50ML-% IV SOLN
20.0000 mg | Freq: Two times a day (BID) | INTRAVENOUS | Status: DC
Start: 2013-07-27 — End: 2013-08-01
  Administered 2013-07-27 – 2013-08-01 (×11): 20 mg via INTRAVENOUS
  Filled 2013-07-27 (×14): qty 50

## 2013-07-27 MED ORDER — RACEPINEPHRINE HCL 2.25 % IN NEBU
0.5000 mL | INHALATION_SOLUTION | Freq: Once | RESPIRATORY_TRACT | Status: AC
  Administered 2013-07-27: 0.5 mL via RESPIRATORY_TRACT

## 2013-07-27 MED ORDER — DIPHENHYDRAMINE HCL 25 MG PO CAPS: 25.0000 mg | ORAL_CAPSULE | Freq: Once | ORAL | Status: DC

## 2013-07-27 MED ORDER — DEXTROSE-NACL 5-0.45 % IV SOLN
INTRAVENOUS | Status: DC
  Administered 2013-07-27: 12:00:00 via INTRAVENOUS
  Administered 2013-07-27: 1000 mL via INTRAVENOUS
  Administered 2013-07-28 – 2013-07-29 (×2): via INTRAVENOUS

## 2013-07-27 MED ORDER — SUCRALFATE 1 GM/10ML PO SUSP: 1.0000 g | Freq: Three times a day (TID) | ORAL | Status: DC

## 2013-07-27 MED ORDER — RACEPINEPHRINE HCL 2.25 % IN NEBU
0.5000 mL | INHALATION_SOLUTION | Freq: Once | RESPIRATORY_TRACT | Status: AC
  Administered 2013-07-27: 0.5 mL via RESPIRATORY_TRACT
  Filled 2013-07-27: qty 0.5

## 2013-07-27 MED ORDER — DEXAMETHASONE SODIUM PHOSPHATE 4 MG/ML IJ SOLN
8.0000 mg | Freq: Four times a day (QID) | INTRAMUSCULAR | Status: AC
  Administered 2013-07-27 – 2013-07-28 (×4): 8 mg via INTRAVENOUS
  Filled 2013-07-27 (×4): qty 2

## 2013-07-27 MED ORDER — DEXAMETHASONE SODIUM PHOSPHATE 4 MG/ML IJ SOLN
8.0000 mg | Freq: Once | INTRAMUSCULAR | Status: AC
  Administered 2013-07-27: 8 mg via INTRAVENOUS
  Filled 2013-07-27: qty 2

## 2013-07-27 MED ORDER — FAMOTIDINE IN NACL 20-0.9 MG/50ML-% IV SOLN
INTRAVENOUS | Status: AC
  Filled 2013-07-27: qty 50

## 2013-07-27 NOTE — Progress Notes (Signed)
CT neck noted.  Patient feels much better. No complaints. He has been weaned to nasal cannula. Stridor has resolved.  Discussed with Dr. Luan Pulling. Given a marked clinical improvement, continue monitoring, Decadron and other modalities as previously noted.  [name], MD [facility] [phone]

## 2013-07-27 NOTE — Progress Notes (Signed)
  Subjective: Events of this morning noted. Patient alert without stridor during my examination.  Objective: Vital signs in last 24 hours: Temp:  [97.5 F (36.4 C)-98.6 F (37 C)] 98.6 F (37 C) (02/04 0730) Resp:  [10-26] 16 (02/04 1100) BP: (103-156)/(68-113) 134/92 mmHg (02/04 1100) SpO2:  [95 %-98 %] 98 % (02/04 0751) Weight:  [56.3 kg (124 lb 1.9 oz)] 56.3 kg (124 lb 1.9 oz) (02/04 0500) Last BM Date: 07/26/13  Intake/Output from previous day: 02/03 0701 - 02/04 0700 In: 2243 [I.V.:2193; IV Piggyback:50] Out: 600 [Urine:600] Intake/Output this shift: Total I/O In: 60 [I.V.:60] Out: 850 [Urine:850]  General appearance: alert and no distress GI: soft, non-tender; bowel sounds normal; no masses,  no organomegaly  Lab Results:   Recent Labs  07/26/13 0543 07/27/13 0900  WBC 4.6 6.9  HGB 11.5* 12.0*  HCT 35.8* 36.8*  PLT 170 176   BMET  Recent Labs  07/26/13 0543 07/27/13 0900  NA 143 140  K 3.8 3.9  CL 105 100  CO2 28 26  GLUCOSE 120* 139*  BUN 15 15  CREATININE 1.09 1.19  CALCIUM 8.6 8.8   PT/INR  Recent Labs  07/25/13 0537  LABPROT 14.2  INR 1.12    Studies/Results: Dg Chest Port 1 View  07/27/2013   CLINICAL DATA:  Shortness of breath, stridor  EXAM: PORTABLE CHEST - 1 VIEW  COMPARISON:  None available  FINDINGS: Cardiac and mediastinal silhouettes are within normal limits.  Lungs are hypoinflated. There are patchy and linear bibasilar opacities, which may reflect atelectasis or possibly infiltrates. There is mild central perihilar vascular congestion without overt pulmonary edema. No pleural effusion. No pneumothorax.  No acute osseous abnormality.  Multiple prominent gas-filled loops of bowel are seen within the visualized upper abdomen, incompletely evaluated.  IMPRESSION: 1. Hypoinflation with bibasilar patchy and linear opacities. While these findings may reflect atelectasis, possible infiltrates could be considered in the correct clinical  setting. 2. Multiple prominent gas-filled loops of bowel within the upper abdomen, incompletely evaluated. Further evaluation with dedicated abdominal radiograph could be performed if there is clinical concern for an underlying obstructive process.   Electronically Signed   By: Jeannine Boga M.D.   On: 07/27/2013 03:49    Anti-infectives: Anti-infectives   None      Assessment/Plan: Impression: Small bowel obstruction, resolving Plan: Agree with need for EGD. Would advance diet once patient is able. No need for acute surgical intervention at this time. Will start a clear liquid diet.  LOS: 3 days    Douglas Mckay A 07/27/2013

## 2013-07-27 NOTE — Progress Notes (Signed)
Patient ID: Douglas Mckay, male   DOB: 1931/10/15, 80 y.o.   MRN: [id number] Patient very restless this am. Stridor noted. He says he is not SOB.  Vent mask in place. Dr. Luan Pulling has been consulted. Per RN, patient had stridor all night.   Abdomen is soft this am. No BM today. Did have BM yesterday.  CT neck ordered. Will hold EGD for now till after this acute episode.

## 2013-07-27 NOTE — Progress Notes (Signed)
[facility] PROGRESS NOTE  Douglas Mckay WGN:[id number] DOB: 04-17-1932 DOA: 07/24/2013 PCP: Marjo Bicker, MD  Summary: 80 year old man presented to the hospital with complaints of one week of abdominal pain. At the time of admission he was noted to be quite tremulous. He was admitted for high-grade small bowel obstruction seen on CT, seen in consultation with general surgery. NG tube was placed which returned dark blood.  Assessment/Plan: 1. Acute hypoxic respiratory failure unclear etiology, sequence of events of last night unclear presumably secondary to stridor. No h/o tobacco. Treat stridor.  2. Stridor. Onset last night, per wife before removal of NG tube possibly. Only new medications Dialudid, hydralazine though hydralazine seems unlikely based on time. 3. Acute encephalopathy. Etiology unclear, possibly from agitation, lack of sleep/fatigue, sedation from Dilaudid suggested by exam. 4. Small bowel obstruction. Thought secondary to adhesions. Reported bowel movement yesterday. Management per surgery. 5. UGIB, coffee-ground emesis. Remote h/o PUD. Management per GI, endoscopy planned. Check CBC. 6. Acute renal failure. Resolved, prerenal pattern and history suggest dehydration. 7. Normocytic anemia, appears stable. 8. Intention tremor, headaches. Apparently present for one month. CT head was negative. Suggest outpatient followup. 9. HTN. Actually borderline hypotensive now. No rash/hives on exam, no swelling, no evidence of anaphylaxis, Secondary to Lasix, no PO intake and hydralazine.   Upgrade to SDU  Wean oxygen as tolerated  D/c dilaudid, hydralazine  Steroids, nebs, racemic epi, Pepcid  Appreciate pulmonary involvement  Repeat CXR, AXR in AM.  Strict I/O  Obtain BMP, CBC today, AM  Oxygenation stable but her acute decompensation overnight and encephalopathy concerning. Patient appears critically ill. Discussed with wife at bedside current issues and  treatment recommendations.   Pending studies:     Code Status: full code DVT prophylaxis: SCDs Family Communication:  Disposition Plan: Pending  Douglas Hodgkins, MD  [facility]  Pager [phone] If 7PM-7AM, please contact night-coverage at [email or web address], password [facility] 07/27/2013, 7:43 AM  LOS: 3 days   Consultants:  General surgery  Gastroenterology  Procedures:    Antibiotics:    HPI/Subjective: Overnight the patient developed stridor of unclear etiology. Treated with racemic epi, decardron, albuterol neb, Pepcid and placed on NRB. Night MD discussd with eLink. Pulmonology consulted this AM.  Discussed with RN, reports no stridor yesterday during the day shift. Patient was somewhat confused and agitated. Timing of onset of stridor last night unclear, per wife occurred prior to patient's self removal of NG tube last night perhaps around 11 PM.  Objective: Filed Vitals:   07/27/13 0400 07/27/13 0406 07/27/13 0500 07/27/13 0600  BP: 118/78  128/83 103/68  Pulse:      Temp: 97.6 F (36.4 C)     TempSrc: Axillary     Resp: 11  21 13   Height:      Weight:   56.3 kg (124 lb 1.9 oz)   SpO2:  97%      Intake/Output Summary (Last 24 hours) at 07/27/13 0743 Last data filed at 07/27/13 0600  Gross per 24 hour  Intake   2243 ml  Output    600 ml  Net   1643 ml     Filed Weights   07/25/13 0618 07/26/13 0500 07/27/13 0500  Weight: 53.4 kg (117 lb 11.6 oz) 57.4 kg (126 lb 8.7 oz) 56.3 kg (124 lb 1.9 oz)    Exam:   Afebrile. As BP 90-100s. SpO2 95 on VM 40%. Normal respiratory rate. Mild sinus tachycardia.  General: The patient appears acutely ill but not toxic.  He is somnolent and does not arouse to voice or sternal rub.  Eyes pupils 2 mm, round, reactive. Lids and irises appear unremarkable.  ENT exam was limited but less appear unremarkable. No swelling of the face noted. Patient does not comply with exam.  Neck appears unremarkable without  swelling.  Cardiovascular tachycardic, regular rhythm. No murmur, rub or gallop. No lower extremity edema. Dorsalis pedis pulse 2+ bilaterally.  Respiratory upper airway inspiratory stridor heard. Prolonged expiration. Fair air movement. No frank wheezes, rales or rhonchi. Grossly normal respiratory effort.  Abdomen soft, nontender, nondistended  Skin appears grossly unremarkable  Neurologic grossly nonfocal, appears to spontaneous movement of limbs. Does not comply with exam.  Psychiatric cannot assess  Data Reviewed:  Laboratory studies from admission, 2/3 noted.  No laboratory studies from today yet.  CT head 2/2 unremarkable  Chest x-ray 2/4 hypoinflation, bibasilar opacities, consider atelectasis or infiltrate. Prominent bowel loops seen.  ABG pH 7.35, PCO2 45, PO2 81  Scheduled Meds: . famotidine (PEPCID) IV  20 mg Intravenous Q12H  . pantoprazole (PROTONIX) IV  40 mg Intravenous Q12H  . sodium chloride  3 mL Intravenous Q12H   Continuous Infusions: . dextrose 5 % and 0.45% NaCl 10 mL/hr at 07/27/13 0600    Principal Problem:   SBO (small bowel obstruction) Active Problems:   HTN (hypertension), benign   Hypercholesteremia   Dehydration   Stridor   Acute respiratory failure with hypoxia   Acute encephalopathy   UGIB (upper gastrointestinal bleed)   Acute renal failure   Normocytic anemia   Time spent 35 minutes

## 2013-07-27 NOTE — Consult Note (Signed)
Consult requested by: Dr. Sarajane Jews Consult requested for stridor:  HPI: This is a 80 year old who came to the emergency department with small bowel obstruction. He was in the intensive care unit in the early this morning developed stridor. He is still having some stridor despite treatment. According to family in the room he does not normally breathes like that and he has no history of any sort of lung trouble.  Past Medical History  Diagnosis Date  . Hypertension   . Hypercholesterolemia   . Cancer     prostate     Family History  Problem Relation Age of Onset  . Stroke Mother      History   Social History  . Marital Status: Married    Spouse Name: N/A    Number of Children: N/A  . Years of Education: N/A   Social History Main Topics  . Smoking status: Former Research scientist (life sciences)  . Smokeless tobacco: None  . Alcohol Use: No  . Drug Use: No  . Sexual Activity: None   Other Topics Concern  . None   Social History Narrative  . None     ROS: He's not really able to give me any information.    Objective: Vital signs in last 24 hours: Temp:  [97.5 F (36.4 C)-98.6 F (37 C)] 98.6 F (37 C) (02/04 0730) Resp:  [11-26] 13 (02/04 0600) BP: (103-156)/(68-117) 103/68 mmHg (02/04 0600) SpO2:  [95 %-98 %] 98 % (02/04 0751) Weight:  [56.3 kg (124 lb 1.9 oz)] 56.3 kg (124 lb 1.9 oz) (02/04 0500) Weight change: -1.1 kg (-2 lb 6.8 oz) Last BM Date: 07/26/13  Intake/Output from previous day: 02/03 0701 - 02/04 0700 In: 2243 [I.V.:2193; IV Piggyback:50] Out: 600 [Urine:600]  PHYSICAL EXAM He is awake. He is wearing a 100% oxygen mask and his oxygen saturation is good. He does have stridor. He does not appear to be in any significant distress. His HEENT exam unremarkable I can't see anything in his throat. His neck is supple. His chest shows some rhonchi bilaterally. His heart is regular. His abdomen is soft. His central nervous system exam grossly intact  Lab Results: Basic  Metabolic Panel:  Recent Labs  07/24/13 2255 07/26/13 0543  NA 145 143  K 3.8 3.8  CL 102 105  CO2 30 28  GLUCOSE 154* 120*  BUN 29* 15  CREATININE 1.37* 1.09  CALCIUM 9.2 8.6   Liver Function Tests:  Recent Labs  07/24/13 2255 07/26/13 0543  AST 26 25  ALT 19 16  ALKPHOS 68 60  BILITOT 0.2* 0.4  PROT 7.7 6.6  ALBUMIN 4.2 3.4*    Recent Labs  07/24/13 2255  LIPASE 59   No results found for this basename: AMMONIA,  in the last 72 hours CBC:  Recent Labs  07/24/13 2255 07/25/13 0537 07/26/13 0543  WBC 3.0* 3.9* 4.6  NEUTROABS 2.0  --   --   HGB 11.5* 10.2* 11.5*  HCT 35.6* 31.8* 35.8*  MCV 84.4 84.4 84.0  PLT 188 148* 170   Cardiac Enzymes: No results found for this basename: CKTOTAL, CKMB, CKMBINDEX, TROPONINI,  in the last 72 hours BNP: No results found for this basename: PROBNP,  in the last 72 hours D-Dimer: No results found for this basename: DDIMER,  in the last 72 hours CBG: No results found for this basename: GLUCAP,  in the last 72 hours Hemoglobin A1C: No results found for this basename: HGBA1C,  in the last 72 hours Fasting  Lipid Panel: No results found for this basename: CHOL, HDL, LDLCALC, TRIG, CHOLHDL, LDLDIRECT,  in the last 72 hours Thyroid Function Tests: No results found for this basename: TSH, T4TOTAL, FREET4, T3FREE, THYROIDAB,  in the last 72 hours Anemia Panel: No results found for this basename: VITAMINB12, FOLATE, FERRITIN, TIBC, IRON, RETICCTPCT,  in the last 72 hours Coagulation:  Recent Labs  07/25/13 0537  LABPROT 14.2  INR 1.12   Urine Drug Screen: Drugs of Abuse  No results found for this basename: labopia, cocainscrnur, labbenz, amphetmu, thcu, labbarb    Alcohol Level: No results found for this basename: ETH,  in the last 72 hours Urinalysis:  Recent Labs  07/25/13 0330  COLORURINE YELLOW  LABSPEC 1.025  PHURINE 5.5  GLUCOSEU NEGATIVE  HGBUR TRACE*  BILIRUBINUR NEGATIVE  KETONESUR NEGATIVE   PROTEINUR 30*  UROBILINOGEN 0.2  NITRITE NEGATIVE  LEUKOCYTESUR NEGATIVE   Misc. Labs:   ABGS:  Recent Labs  07/27/13 0330  PHART 7.358  PO2ART 81.4  TCO2 23.1  HCO3 25.0*     MICROBIOLOGY: Recent Results (from the past 240 hour(s))  MRSA PCR SCREENING     Status: None   Collection Time    07/25/13  5:57 AM      Result Value Range Status   MRSA by PCR NEGATIVE  NEGATIVE Final   Comment:            The GeneXpert MRSA Assay (FDA     approved for NASAL specimens     only), is one component of a     comprehensive MRSA colonization     surveillance program. It is not     intended to diagnose MRSA     infection nor to guide or     monitor treatment for     MRSA infections.    Studies/Results: Dg Chest Port 1 View  07/27/2013   CLINICAL DATA:  Shortness of breath, stridor  EXAM: PORTABLE CHEST - 1 VIEW  COMPARISON:  None available  FINDINGS: Cardiac and mediastinal silhouettes are within normal limits.  Lungs are hypoinflated. There are patchy and linear bibasilar opacities, which may reflect atelectasis or possibly infiltrates. There is mild central perihilar vascular congestion without overt pulmonary edema. No pleural effusion. No pneumothorax.  No acute osseous abnormality.  Multiple prominent gas-filled loops of bowel are seen within the visualized upper abdomen, incompletely evaluated.  IMPRESSION: 1. Hypoinflation with bibasilar patchy and linear opacities. While these findings may reflect atelectasis, possible infiltrates could be considered in the correct clinical setting. 2. Multiple prominent gas-filled loops of bowel within the upper abdomen, incompletely evaluated. Further evaluation with dedicated abdominal radiograph could be performed if there is clinical concern for an underlying obstructive process.   Electronically Signed   By: Jeannine Boga M.D.   On: 07/27/2013 03:49    Medications:  Prior to Admission:  Prescriptions prior to admission  Medication  Sig Dispense Refill  . amLODipine-benazepril (LOTREL) 5-40 MG per capsule Take 1 capsule by mouth daily.      . carvedilol (COREG) 3.125 MG tablet Take 3.125 mg by mouth daily.      . simvastatin (ZOCOR) 20 MG tablet Take 20 mg by mouth every evening.       Scheduled: . dexamethasone  8 mg Intravenous Q6H  . famotidine (PEPCID) IV  20 mg Intravenous Q12H  . pantoprazole (PROTONIX) IV  40 mg Intravenous Q12H  . sodium chloride  3 mL Intravenous Q12H   Continuous: . dextrose 5 %  and 0.45% NaCl 10 mL/hr at 07/27/13 0600  . dextrose 5 % and 0.45% NaCl     GOT:[id number], acetaminophen, albuterol, ondansetron (ZOFRAN) IV, ondansetron  Assesment: He was admitted with small bowel obstruction. He has hypertension and has been well-controlled. He was dehydrated on admission and that seems to have largely cleared. He developed stridor and is not quite clear what this is from. He still has some stridor. He looks comfortable. He has no history of any lung problems. Principal Problem:   SBO (small bowel obstruction) Active Problems:   HTN (hypertension), benign   Hypercholesteremia   Dehydration   Stridor    Plan: Continue with rx. Ct neck with contrast    LOS: 3 days   Taji Barretto L 07/27/2013, 8:40 AM

## 2013-07-27 NOTE — Progress Notes (Addendum)
Called by Rn as patient appeared to have stridor.  Patient sitting up on my assessment. He denies any pain.  Vitals reviewed. BP and HR slightly elevated. Stridor heard on examination. Few crackles at bases.  Last received dilaudid at 11Pm.  Etiology of stridor not clear.  Will try Receminc Epi Neb, Decadron and Alb Neb. CXR.  Discussed with Elink MD. They agree with management.  Can repeat Racepinephrine if needed. Pepcid IV.  Patient currently maintaning airway. Saturating well on NRB. If his status deteriorates he will need intubation.  Discussed with wife.  Will consult Dr. Luan Pulling to see him in AM.  Critical Care Time: 30 mins  Gerron Guidotti 4:06 AM

## 2013-07-27 NOTE — Progress Notes (Signed)
Results of CT neck reviewed. Condition discussed with Dr. Sarajane Jews earlier today. Patient denies abdominal pain or shortness of breath. He is tolerating clear liquids and diet has been advanced to full liquids. Will plan EGD on 07/29/2013.

## 2013-07-28 ENCOUNTER — Inpatient Hospital Stay (HOSPITAL_COMMUNITY): Payer: Medicare Other

## 2013-07-28 LAB — CBC
HCT: 33.1 % — ABNORMAL LOW (ref 39.0–52.0)
Hemoglobin: 11 g/dL — ABNORMAL LOW (ref 13.0–17.0)
MCH: 27.2 pg (ref 26.0–34.0)
MCHC: 33.2 g/dL (ref 30.0–36.0)
MCV: 81.7 fL (ref 78.0–100.0)
PLATELETS: 161 10*3/uL (ref 150–400)
RBC: 4.05 MIL/uL — AB (ref 4.22–5.81)
RDW: 14.5 % (ref 11.5–15.5)
WBC: 7.4 10*3/uL (ref 4.0–10.5)

## 2013-07-28 LAB — BASIC METABOLIC PANEL
BUN: 24 mg/dL — ABNORMAL HIGH (ref 6–23)
CO2: 26 meq/L (ref 19–32)
CREATININE: 1.36 mg/dL — AB (ref 0.50–1.35)
Calcium: 8.8 mg/dL (ref 8.4–10.5)
Chloride: 100 mEq/L (ref 96–112)
GFR calc Af Amer: 55 mL/min — ABNORMAL LOW (ref >90)
GFR calc non Af Amer: 48 mL/min — ABNORMAL LOW (ref >90)
GLUCOSE: 159 mg/dL — AB (ref 70–99)
Potassium: 3.8 mEq/L (ref 3.7–5.3)
Sodium: 140 mEq/L (ref 137–147)

## 2013-07-28 MED ORDER — LORAZEPAM 2 MG/ML IJ SOLN
1.0000 mg | Freq: Once | INTRAMUSCULAR | Status: AC
  Administered 2013-07-28: 1 mg via INTRAVENOUS
  Filled 2013-07-28: qty 1

## 2013-07-28 MED ORDER — LORAZEPAM 2 MG/ML IJ SOLN
INTRAMUSCULAR | Status: AC
  Filled 2013-07-28: qty 1

## 2013-07-28 MED ORDER — LORAZEPAM 2 MG/ML IJ SOLN
0.5000 mg | Freq: Four times a day (QID) | INTRAMUSCULAR | Status: DC | PRN
  Administered 2013-07-28 – 2013-07-29 (×3): 0.5 mg via INTRAVENOUS
  Filled 2013-07-28 (×3): qty 1

## 2013-07-28 NOTE — Plan of Care (Signed)
Problem: Phase I Progression Outcomes Goal: Pain controlled with appropriate interventions Outcome: Completed/Met Date Met:  07/28/13 No complaints of pain Goal: OOB as tolerated unless otherwise ordered Outcome: Progressing oob to recliner today Goal: Initial discharge plan identified Outcome: [place] with spouse Goal: Voiding-avoid urinary catheter unless indicated Outcome: Completed/Met Date Met:  07/28/13 voiding

## 2013-07-28 NOTE — Progress Notes (Signed)
[facility] PROGRESS NOTE  Douglas Mckay ZOX:[id number] DOB: Nov 19, 1931 DOA: 07/24/2013 PCP: Marjo Bicker, MD  Summary: 80 year old man presented to the hospital with complaints of one week of abdominal pain. At the time of admission he was noted to be quite tremulous. He was admitted for high-grade small bowel obstruction seen on CT, seen in consultation with general surgery. NG tube was placed which returned dark blood. He was followed by surgery, fortunately bowel obstruction appears to be resolving and no intervention is planned. He removed the NG tube himself evening 2/3 and developed stridor from this. This has subsequently resolved.  Assessment/Plan: 1. Acute hypoxic respiratory failure appears to be resolving rapidly. Plan to wean off oxygen. Secondary to stridor. No evidence of pneumonia. No history of underlying lung disease. 2. Stridor. Most likely related to patient's self removal of NG tube. Doubt medication related. This is completely resolved at this point. If recurs consider steroids and racemic epinephrine. 3. Acute encephalopathy. This appears resolved at this point. Likely multifactorial, including lack of sleep, fatigue, sedation from previous pain medication. 4. Small bowel obstruction. Clinically appears to be resolved. Surgery commenced observation. 5. UGIB, coffee-ground emesis. No recurrence. Remote history of peptic ulcer disease. GI plans upper endoscopy. Hemoglobin stable. 6. Acute renal failure.. No pattern and history suggest dehydration. Plan IV fluids. 7. Normocytic anemia. Stable. 8. Intention tremor upper extremities for one month, headaches. CT head negative. CT soft tissue neck did indicate advanced cervical spine degenerative changes with suspected moderate to severe degenerative spinal stenosis. Recommend outpatient followup with neurosurgery. No focal neurologic deficits at this point. 9. Hypertension stable.   Overall improvement. Stridor has  resolved. Plan transferred to medical bed.  Further evaluation of upper GI bleed per gastroenterology.  Advance diet and monitor clinically.  IV fluids for dehydration.  Hopefully home next 48 hours and continues to improve. Appreciate pulmonology and surgical involvement. Discussed with wife at bedside.  Pending studies:     Code Status: full code DVT prophylaxis: SCDs Family Communication:  Disposition Plan: Pending  Douglas Hodgkins, MD  [facility]  Pager [phone] If 7PM-7AM, please contact night-coverage at [email or web address], password [facility] 07/28/2013, 8:26 AM  LOS: 4 days   Consultants:  General surgery  Gastroenterology  Procedures:    Antibiotics:    HPI/Subjective: No issues overnight except RN noted some expiratory wheezing. Breathing fine. No pain. Tolerating diet.  Objective: Filed Vitals:   07/28/13 0200 07/28/13 0400 07/28/13 0500 07/28/13 0730  BP: 130/83 128/71    Pulse:      Temp:   98.1 F (36.7 C) 99 F (37.2 C)  TempSrc:    Oral  Resp: 13 13    Height:      Weight:   56.8 kg (125 lb 3.5 oz)   SpO2:  97%      Intake/Output Summary (Last 24 hours) at 07/28/13 0826 Last data filed at 07/28/13 0500  Gross per 24 hour  Intake   1365 ml  Output   1000 ml  Net    365 ml     Filed Weights   07/26/13 0500 07/27/13 0500 07/28/13 0500  Weight: 57.4 kg (126 lb 8.7 oz) 56.3 kg (124 lb 1.9 oz) 56.8 kg (125 lb 3.5 oz)    Exam:   Afebrile. Vital signs stable.  General: Appears calm and comfortable. Appears much better today.  Respiratory clear to auscultation bilaterally. No wheezes, rales or rhonchi. No stridor. Normal respiratory effort.  Cardiovascular: Regular rate and rhythm. No murmur,  rub or gallop. No lower extremity edema.  Abdomen soft nontender nondistended.  Skin appears grossly unremarkable.  Data Reviewed:  BUN and creatinine slightly elevated. Hemoglobin stable at 11.0.  Abdominal film, no evidence of  obstruction.  Chest film shows improved aeration both lungs.  Scheduled Meds: . famotidine (PEPCID) IV  20 mg Intravenous Q12H  . magnesium hydroxide  30 mL Oral BID AC  . pantoprazole (PROTONIX) IV  40 mg Intravenous Q12H  . sodium chloride  3 mL Intravenous Q12H   Continuous Infusions: . dextrose 5 % and 0.45% NaCl Stopped (07/27/13 1200)  . dextrose 5 % and 0.45% NaCl 75 mL/hr at 07/28/13 0500    Principal Problem:   SBO (small bowel obstruction) Active Problems:   HTN (hypertension), benign   Hypercholesteremia   Dehydration   Stridor   Acute respiratory failure with hypoxia   Acute encephalopathy   UGIB (upper gastrointestinal bleed)   Acute renal failure   Normocytic anemia   Time spent 25 minutes

## 2013-07-28 NOTE — Progress Notes (Signed)
Patient ID: Douglas Mckay, male   DOB: April 19, 1932, 80 y.o.   MRN: [id number] Feels much better today. Breathing is normal.  Ate most of his breakfast. Abdomen is distended today. Tense. BS+.  Had a BM this am.  Says he has a small amt flatus.  Will continue to monitor. CBC    Component Value Date/Time   WBC 7.4 07/28/2013 0549   RBC 4.05* 07/28/2013 0549   HGB 11.0* 07/28/2013 0549   HCT 33.1* 07/28/2013 0549   PLT 161 07/28/2013 0549   MCV 81.7 07/28/2013 0549   MCH 27.2 07/28/2013 0549   MCHC 33.2 07/28/2013 0549   RDW 14.5 07/28/2013 0549   LYMPHSABS 0.8 07/24/2013 2255   MONOABS 0.2 07/24/2013 2255   EOSABS 0.0 07/24/2013 2255   BASOSABS 0.0 07/24/2013 2255

## 2013-07-28 NOTE — Progress Notes (Signed)
Patient transported by NT to unit 300.

## 2013-07-28 NOTE — Progress Notes (Signed)
Subjective: He is much better today. He is confused. He is hoarse. He is not having stridor  Objective: Vital signs in last 24 hours: Temp:  [98.1 F (36.7 C)-99.1 F (37.3 C)] 99 F (37.2 C) (02/05 0730) Resp:  [10-16] 13 (02/05 0400) BP: (111-134)/(70-92) 128/71 mmHg (02/05 0400) SpO2:  [97 %-98 %] 97 % (02/05 0400) Weight:  [56.8 kg (125 lb 3.5 oz)] 56.8 kg (125 lb 3.5 oz) (02/05 0500) Weight change: 0.5 kg (1 lb 1.6 oz) Last BM Date: 07/27/13  Intake/Output from previous day: 02/04 0701 - 02/05 0700 In: 1365 [I.V.:1315; IV Piggyback:50] Out: 1000 [Urine:1000]  PHYSICAL EXAM General appearance: alert and mild distress Resp: rhonchi bilaterally Cardio: regular rate and rhythm, S1, S2 normal, no murmur, click, rub or gallop GI: soft, non-tender; bowel sounds normal; no masses,  no organomegaly Extremities: extremities normal, atraumatic, no cyanosis or edema  Lab Results:    Basic Metabolic Panel:  Recent Labs  07/27/13 0900 07/28/13 0549  NA 140 140  K 3.9 3.8  CL 100 100  CO2 26 26  GLUCOSE 139* 159*  BUN 15 24*  CREATININE 1.19 1.36*  CALCIUM 8.8 8.8   Liver Function Tests:  Recent Labs  07/26/13 0543 07/27/13 0900  AST 25 28  ALT 16 16  ALKPHOS 60 67  BILITOT 0.4 0.4  PROT 6.6 7.4  ALBUMIN 3.4* 3.6   No results found for this basename: LIPASE, AMYLASE,  in the last 72 hours No results found for this basename: AMMONIA,  in the last 72 hours CBC:  Recent Labs  07/27/13 0900 07/28/13 0549  WBC 6.9 7.4  HGB 12.0* 11.0*  HCT 36.8* 33.1*  MCV 82.7 81.7  PLT 176 161   Cardiac Enzymes: No results found for this basename: CKTOTAL, CKMB, CKMBINDEX, TROPONINI,  in the last 72 hours BNP: No results found for this basename: PROBNP,  in the last 72 hours D-Dimer: No results found for this basename: DDIMER,  in the last 72 hours CBG: No results found for this basename: GLUCAP,  in the last 72 hours Hemoglobin A1C: No results found for this  basename: HGBA1C,  in the last 72 hours Fasting Lipid Panel: No results found for this basename: CHOL, HDL, LDLCALC, TRIG, CHOLHDL, LDLDIRECT,  in the last 72 hours Thyroid Function Tests: No results found for this basename: TSH, T4TOTAL, FREET4, T3FREE, THYROIDAB,  in the last 72 hours Anemia Panel: No results found for this basename: VITAMINB12, FOLATE, FERRITIN, TIBC, IRON, RETICCTPCT,  in the last 72 hours Coagulation: No results found for this basename: LABPROT, INR,  in the last 72 hours Urine Drug Screen: Drugs of Abuse  No results found for this basename: labopia, cocainscrnur, labbenz, amphetmu, thcu, labbarb    Alcohol Level: No results found for this basename: ETH,  in the last 72 hours Urinalysis:  Recent Labs  07/27/13 0900  COLORURINE YELLOW  LABSPEC 1.015  PHURINE 5.0  GLUCOSEU NEGATIVE  HGBUR NEGATIVE  BILIRUBINUR NEGATIVE  KETONESUR NEGATIVE  PROTEINUR NEGATIVE  UROBILINOGEN 0.2  [place]. Labs:  ABGS  Recent Labs  07/27/13 0330  PHART 7.358  PO2ART 81.4  TCO2 23.1  HCO3 25.0*   CULTURES Recent Results (from the past 240 hour(s))  MRSA PCR SCREENING     Status: None   Collection Time    07/25/13  5:57 AM      Result Value Range Status   MRSA by PCR NEGATIVE  NEGATIVE Final  Comment:            The GeneXpert MRSA Assay (FDA     approved for NASAL specimens     only), is one component of a     comprehensive MRSA colonization     surveillance program. It is not     intended to diagnose MRSA     infection nor to guide or     monitor treatment for     MRSA infections.   Studies/Results: Ct Soft Tissue Neck W Contrast  07/27/2013   CLINICAL DATA:  80 year old male with stridor beginning last night. Recent small bowel obstruction. Initial encounter.  EXAM: CT NECK WITH CONTRAST  TECHNIQUE: Multidetector CT imaging of the neck was performed using the standard protocol following the bolus administration of  intravenous contrast.  CONTRAST:  70mL OMNIPAQUE IOHEXOL 300 MG/ML  SOLN  COMPARISON:  Head CTs 07/25/2013 and earlier.  FINDINGS: Layering bilateral pleural effusions, small to moderate. Associated compressive atelectasis. Mild increased reticular opacity in the dependent right lung (series 3, image 14) also favored to be atelectasis at this time.  Widely patent, and perhaps mildly bronchiectatic intra thoracic trachea. Larynx is remarkable for medial deviation of the left false cord, asymmetric appearance of the laryngeal ventricle. Area epiglottic folds appear mildly to moderately thickened, along with the epiglottis. There is associated airway narrowing at the level of the false cords. See series 2, image 66.  There is a small volume retropharyngeal effusion at the level of the larynx. Also noted is a retropharyngeal course of the right ICA (arrow on series 2, image 55).  Distended hypopharynx. Oropharynx and nasopharynx within normal limits. Negative parapharyngeal spaces. Negative sublingual space. Submandibular and parotid glands are within normal limits.  Calcified atherosclerosis at the skull base. Major vascular structures in the neck appear to remain patent, suboptimal intravascular contrast bolus timing. Bilateral carotid bifurcation calcified plaque.  No cervical lymphadenopathy identified.  It dental os. Visualized paranasal sinuses and mastoids are clear. Reversed cervical lordosis. Lower cervical chronic disc and endplate degeneration. Vacuum disc phenomena at C5-C6. Multifactorial moderate to severe degenerative spinal stenosis suspected at C5-C6 in part related to left paracentral disc protrusion (series 2, image 54). No acute osseous abnormality identified.  IMPRESSION: 1. Generalized supraglottic laryngeal edema appears to be responsible for narrowing of the supraglottic airway (see series 2, image 66). Small retropharyngeal effusion. Node also a retropharyngeal course of the right carotid. 2.  Layering bilateral pleural effusions with mild pulmonary atelectasis in the visualized upper lungs. 3. Advanced cervical spine degenerative changes, specially at C5-C6 where multifactorial moderate to severe degenerative spinal stenosis is suspected. Study discussed by telephone on 07/27/2013 at 12:59 with ICU Nurse Jolene Provost.   Electronically Signed   By: Lars Pinks M.D.   On: 07/27/2013 13:00   Dg Chest Port 1 View  07/28/2013   CLINICAL DATA:  Atelectasis.  Small bowel obstruction.  EXAM: PORTABLE CHEST - 1 VIEW  COMPARISON:  07/27/2013  FINDINGS: Heart size is normal. There is asymmetric elevation of the right hemidiaphragm. No pleural effusion or edema identified. No airspace consolidation.  IMPRESSION: 1. Improved aeration of both lungs. 2. Persistent asymmetric elevation of right hemidiaphragm.   Electronically Signed   By: Kerby Moors M.D.   On: 07/28/2013 08:59   Dg Chest Port 1 View  07/27/2013   CLINICAL DATA:  Shortness of breath, stridor  EXAM: PORTABLE CHEST - 1 VIEW  COMPARISON:  None available  FINDINGS: Cardiac and mediastinal silhouettes  are within normal limits.  Lungs are hypoinflated. There are patchy and linear bibasilar opacities, which may reflect atelectasis or possibly infiltrates. There is mild central perihilar vascular congestion without overt pulmonary edema. No pleural effusion. No pneumothorax.  No acute osseous abnormality.  Multiple prominent gas-filled loops of bowel are seen within the visualized upper abdomen, incompletely evaluated.  IMPRESSION: 1. Hypoinflation with bibasilar patchy and linear opacities. While these findings may reflect atelectasis, possible infiltrates could be considered in the correct clinical setting. 2. Multiple prominent gas-filled loops of bowel within the upper abdomen, incompletely evaluated. Further evaluation with dedicated abdominal radiograph could be performed if there is clinical concern for an underlying obstructive process.    Electronically Signed   By: Jeannine Boga M.D.   On: 07/27/2013 03:49   Dg Abd Portable 1v  07/28/2013   CLINICAL DATA:  Followup small bowel obstruction  EXAM: PORTABLE ABDOMEN - 1 VIEW  COMPARISON:  07/25/2013  FINDINGS: Scattered large and small bowel gas is noted. No definitive obstructive changes seen. The overall appearance is more of a and ileus then true small-bowel obstruction. Air is noted in the right inguinal region consistent with the known inguinal hernia. This is not appear to be incarcerated. No free air is seen. No acute bony abnormality is noted.  IMPRESSION: No definitive small bowel obstruction.  Gas within the right inguinal hernia consistent with bowel herniation.   Electronically Signed   By: Inez Catalina M.D.   On: 07/28/2013 08:57    Medications:  Scheduled: . famotidine (PEPCID) IV  20 mg Intravenous Q12H  . magnesium hydroxide  30 mL Oral BID AC  . pantoprazole (PROTONIX) IV  40 mg Intravenous Q12H  . sodium chloride  3 mL Intravenous Q12H   Continuous: . dextrose 5 % and 0.45% NaCl Stopped (07/27/13 1200)  . dextrose 5 % and 0.45% NaCl 75 mL/hr at 07/28/13 0500   DQQ:[id number], acetaminophen, albuterol, ondansetron (ZOFRAN) IV, ondansetron  Assesment: He was admitted with small bowel obstruction and developed stridor. I think his stridor is related to either placement of his nasogastric tube or since the patient removed the nasogastric tube himself and he seems to have had some trauma to the posterior pharynx which caused his stridor. This is better. He had acute respiratory failure with hypoxia which is also better. Principal Problem:   SBO (small bowel obstruction) Active Problems:   HTN (hypertension), benign   Hypercholesteremia   Dehydration   Stridor   Acute respiratory failure with hypoxia   Acute encephalopathy   UGIB (upper gastrointestinal bleed)   Acute renal failure   Normocytic anemia    Plan: Continue the course of Decadron. I  would do the 4 doses and see how he does if he has more trouble resume for another 4 doses    LOS: 4 days   Clydie Dillen L 07/28/2013, 9:04 AM

## 2013-07-28 NOTE — Progress Notes (Signed)
Subjective: Patient denies any abdominal pain. Continues to have bowel movements.  Objective: Vital signs in last 24 hours: Temp:  [98.1 F (36.7 C)-99.1 F (37.3 C)] 99 F (37.2 C) (02/05 0730) Resp:  [10-16] 13 (02/05 0400) BP: (111-134)/(70-92) 128/71 mmHg (02/05 0400) SpO2:  [97 %-98 %] 97 % (02/05 0400) Weight:  [56.8 kg (125 lb 3.5 oz)] 56.8 kg (125 lb 3.5 oz) (02/05 0500) Last BM Date: 07/27/13  Intake/Output from previous day: 02/04 0701 - 02/05 0700 In: 1365 [I.V.:1315; IV Piggyback:50] Out: 1000 [Urine:1000] Intake/Output this shift:    General appearance: alert, cooperative and appears stated age GI: Soft, slightly more distended than yesterday. Bowel sounds appreciated. Nontender.  Lab Results:   Recent Labs  07/27/13 0900 07/28/13 0549  WBC 6.9 7.4  HGB 12.0* 11.0*  HCT 36.8* 33.1*  PLT 176 161   BMET  Recent Labs  07/27/13 0900 07/28/13 0549  NA 140 140  K 3.9 3.8  CL 100 100  CO2 26 26  GLUCOSE 139* 159*  BUN 15 24*  CREATININE 1.19 1.36*  CALCIUM 8.8 8.8   PT/INR No results found for this basename: LABPROT, INR,  in the last 72 hours  Studies/Results: Ct Soft Tissue Neck W Contrast  07/27/2013   CLINICAL DATA:  80 year old male with stridor beginning last night. Recent small bowel obstruction. Initial encounter.  EXAM: CT NECK WITH CONTRAST  TECHNIQUE: Multidetector CT imaging of the neck was performed using the standard protocol following the bolus administration of intravenous contrast.  CONTRAST:  30mL OMNIPAQUE IOHEXOL 300 MG/ML  SOLN  COMPARISON:  Head CTs 07/25/2013 and earlier.  FINDINGS: Layering bilateral pleural effusions, small to moderate. Associated compressive atelectasis. Mild increased reticular opacity in the dependent right lung (series 3, image 14) also favored to be atelectasis at this time.  Widely patent, and perhaps mildly bronchiectatic intra thoracic trachea. Larynx is remarkable for medial deviation of the left false  cord, asymmetric appearance of the laryngeal ventricle. Area epiglottic folds appear mildly to moderately thickened, along with the epiglottis. There is associated airway narrowing at the level of the false cords. See series 2, image 66.  There is a small volume retropharyngeal effusion at the level of the larynx. Also noted is a retropharyngeal course of the right ICA (arrow on series 2, image 55).  Distended hypopharynx. Oropharynx and nasopharynx within normal limits. Negative parapharyngeal spaces. Negative sublingual space. Submandibular and parotid glands are within normal limits.  Calcified atherosclerosis at the skull base. Major vascular structures in the neck appear to remain patent, suboptimal intravascular contrast bolus timing. Bilateral carotid bifurcation calcified plaque.  No cervical lymphadenopathy identified.  It dental os. Visualized paranasal sinuses and mastoids are clear. Reversed cervical lordosis. Lower cervical chronic disc and endplate degeneration. Vacuum disc phenomena at C5-C6. Multifactorial moderate to severe degenerative spinal stenosis suspected at C5-C6 in part related to left paracentral disc protrusion (series 2, image 54). No acute osseous abnormality identified.  IMPRESSION: 1. Generalized supraglottic laryngeal edema appears to be responsible for narrowing of the supraglottic airway (see series 2, image 66). Small retropharyngeal effusion. Node also a retropharyngeal course of the right carotid. 2. Layering bilateral pleural effusions with mild pulmonary atelectasis in the visualized upper lungs. 3. Advanced cervical spine degenerative changes, specially at C5-C6 where multifactorial moderate to severe degenerative spinal stenosis is suspected. Study discussed by telephone on 07/27/2013 at 12:59 with ICU Nurse Jolene Provost.   Electronically Signed   By: Lezlie Octave.D.  On: 07/27/2013 13:00   Dg Chest Port 1 View  07/27/2013   CLINICAL DATA:  Shortness of breath, stridor   EXAM: PORTABLE CHEST - 1 VIEW  COMPARISON:  None available  FINDINGS: Cardiac and mediastinal silhouettes are within normal limits.  Lungs are hypoinflated. There are patchy and linear bibasilar opacities, which may reflect atelectasis or possibly infiltrates. There is mild central perihilar vascular congestion without overt pulmonary edema. No pleural effusion. No pneumothorax.  No acute osseous abnormality.  Multiple prominent gas-filled loops of bowel are seen within the visualized upper abdomen, incompletely evaluated.  IMPRESSION: 1. Hypoinflation with bibasilar patchy and linear opacities. While these findings may reflect atelectasis, possible infiltrates could be considered in the correct clinical setting. 2. Multiple prominent gas-filled loops of bowel within the upper abdomen, incompletely evaluated. Further evaluation with dedicated abdominal radiograph could be performed if there is clinical concern for an underlying obstructive process.   Electronically Signed   By: Jeannine Boga M.D.   On: 07/27/2013 03:49   Dg Abd Portable 1v  07/28/2013   CLINICAL DATA:  Followup small bowel obstruction  EXAM: PORTABLE ABDOMEN - 1 VIEW  COMPARISON:  07/25/2013  FINDINGS: Scattered large and small bowel gas is noted. No definitive obstructive changes seen. The overall appearance is more of a and ileus then true small-bowel obstruction. Air is noted in the right inguinal region consistent with the known inguinal hernia. This is not appear to be incarcerated. No free air is seen. No acute bony abnormality is noted.  IMPRESSION: No definitive small bowel obstruction.  Gas within the right inguinal hernia consistent with bowel herniation.   Electronically Signed   By: Inez Catalina M.D.   On: 07/28/2013 08:57    Anti-infectives: Anti-infectives   None      Assessment/Plan: Impression: Small bowel obstruction, partial. No need for acute surgical intervention at this time. KUB this morning shows no  definitive bowel obstruction. Plan: Advance diet as tolerated. Will continue to follow peripherally with you.  LOS: 4 days    Elver Stadler A 07/28/2013

## 2013-07-28 NOTE — Progress Notes (Signed)
Report called and given to [facility], Therapist, sports. Patient alert, oriented and in stable condition at the time of transport. Patient being transported to room 213. Patients wife is at the bedside during transport. Patient being transported in wheelchair by NT.

## 2013-07-28 NOTE — Progress Notes (Signed)
Patient yelling and pulling at IV.  Patient standing up and out of bed.  States that he wants to "get out of here" and "leave me alone" repeatedly.  RN and NT in room.  Patient very difficult to redirect.  Patient's wife in room.  Dr. Sarajane Jews notified via text page.

## 2013-07-28 NOTE — Progress Notes (Signed)
Patient has developed agitation and confusion thought to be secondary to steroids given for stridor. No hematemesis or melena reported. His abdomen is more distended than it was yesterday but he has good bowel sounds he has had 2 bowel movements today. Will postpone plans for EGD until confusion and agitation resolved.

## 2013-07-28 NOTE — Progress Notes (Signed)
Expiratory wheezes tonight bilat upper lobes, patient has remained oriented x 2 tonight.  Stated it was the year 2004, then yelled out I'm not crazy.  ( I assured him that I don't believe he is crazy and that we have to ask these questions).  This patient and his wife have not slept all night.  I was not sure what to get for him as some medications were discontinued due to possible anaphylaxis. Monitoring closely.  Patient has remained safe.

## 2013-07-28 NOTE — Progress Notes (Signed)
Patient received PRN dose of Ativan.  Continues to be agitated.  Trying to get out of bed.  States he is in [place], [place].  Unable to be redirected.  Dr. Sarajane Jews notified via text page.

## 2013-07-29 ENCOUNTER — Inpatient Hospital Stay (HOSPITAL_COMMUNITY): Payer: Medicare Other

## 2013-07-29 LAB — CBC
HCT: 34 % — ABNORMAL LOW (ref 39.0–52.0)
Hemoglobin: 11.1 g/dL — ABNORMAL LOW (ref 13.0–17.0)
MCH: 26.9 pg (ref 26.0–34.0)
MCHC: 32.6 g/dL (ref 30.0–36.0)
MCV: 82.5 fL (ref 78.0–100.0)
Platelets: 241 K/uL (ref 150–400)
RBC: 4.12 MIL/uL — ABNORMAL LOW (ref 4.22–5.81)
RDW: 14.6 % (ref 11.5–15.5)
WBC: 10.6 K/uL — ABNORMAL HIGH (ref 4.0–10.5)

## 2013-07-29 LAB — BLOOD GAS, ARTERIAL
ACID-BASE EXCESS: 2.6 mmol/L — AB (ref 0.0–2.0)
BICARBONATE: 27.1 meq/L — AB (ref 20.0–24.0)
Drawn by: 234301
FIO2: 100 %
O2 Saturation: 98.8 %
PCO2 ART: 45.1 mmHg — AB (ref 35.0–45.0)
PH ART: 7.396 (ref 7.350–7.450)
Patient temperature: 37
TCO2: 24.5 mmol/L (ref 0–100)
pO2, Arterial: 158 mmHg — ABNORMAL HIGH (ref 80.0–100.0)

## 2013-07-29 LAB — BASIC METABOLIC PANEL WITH GFR
BUN: 23 mg/dL (ref 6–23)
CO2: 25 meq/L (ref 19–32)
Calcium: 8.5 mg/dL (ref 8.4–10.5)
Chloride: 100 meq/L (ref 96–112)
Creatinine, Ser: 1.29 mg/dL (ref 0.50–1.35)
GFR calc Af Amer: 59 mL/min — ABNORMAL LOW
GFR calc non Af Amer: 51 mL/min — ABNORMAL LOW
Glucose, Bld: 164 mg/dL — ABNORMAL HIGH (ref 70–99)
Potassium: 3.2 meq/L — ABNORMAL LOW (ref 3.7–5.3)
Sodium: 139 meq/L (ref 137–147)

## 2013-07-29 MED ORDER — RACEPINEPHRINE HCL 2.25 % IN NEBU
0.5000 mL | INHALATION_SOLUTION | RESPIRATORY_TRACT | Status: DC | PRN
  Administered 2013-07-29 (×3): 0.5 mL via RESPIRATORY_TRACT
  Administered 2013-07-29: 1 mL via RESPIRATORY_TRACT
  Administered 2013-08-01: 0.5 mL via RESPIRATORY_TRACT
  Filled 2013-07-29 (×5): qty 0.5

## 2013-07-29 MED ORDER — FUROSEMIDE 10 MG/ML IJ SOLN
40.0000 mg | Freq: Once | INTRAMUSCULAR | Status: AC
  Administered 2013-07-29: 40 mg via INTRAVENOUS
  Filled 2013-07-29: qty 4

## 2013-07-29 MED ORDER — IOHEXOL 300 MG/ML  SOLN
75.0000 mL | Freq: Once | INTRAMUSCULAR | Status: AC | PRN
  Administered 2013-07-29: 75 mL via INTRAVENOUS

## 2013-07-29 MED ORDER — VANCOMYCIN HCL IN DEXTROSE 750-5 MG/150ML-% IV SOLN
750.0000 mg | INTRAVENOUS | Status: DC
  Administered 2013-07-29: 750 mg via INTRAVENOUS
  Filled 2013-07-29 (×3): qty 150

## 2013-07-29 MED ORDER — PIPERACILLIN-TAZOBACTAM 3.375 G IVPB
3.3750 g | Freq: Three times a day (TID) | INTRAVENOUS | Status: DC
  Administered 2013-07-29 – 2013-07-31 (×6): 3.375 g via INTRAVENOUS
  Filled 2013-07-29 (×10): qty 50

## 2013-07-29 MED ORDER — LORAZEPAM 2 MG/ML IJ SOLN
2.0000 mg | Freq: Once | INTRAMUSCULAR | Status: AC
  Administered 2013-07-29: 2 mg via INTRAVENOUS
  Filled 2013-07-29: qty 1

## 2013-07-29 MED ORDER — FAMOTIDINE IN NACL 20-0.9 MG/50ML-% IV SOLN
INTRAVENOUS | Status: AC
  Filled 2013-07-29: qty 50

## 2013-07-29 MED ORDER — DEXAMETHASONE SODIUM PHOSPHATE 4 MG/ML IJ SOLN
8.0000 mg | Freq: Four times a day (QID) | INTRAMUSCULAR | Status: AC
  Administered 2013-07-29 – 2013-07-30 (×4): 8 mg via INTRAVENOUS
  Filled 2013-07-29 (×4): qty 2

## 2013-07-29 MED ORDER — LORAZEPAM 2 MG/ML IJ SOLN
0.5000 mg | Freq: Once | INTRAMUSCULAR | Status: AC
  Administered 2013-07-29: 0.5 mg via INTRAVENOUS
  Filled 2013-07-29 (×2): qty 1

## 2013-07-29 NOTE — Progress Notes (Signed)
ANTIBIOTIC CONSULT NOTE - INITIAL  Pharmacy Consult for Vancomycin & Zosyn Indication: rule out pneumonia  No Known Allergies  Patient Measurements: Height: 5\' 1"  (154.9 cm) Weight: 125 lb 3.5 oz (56.8 kg) IBW/kg (Calculated) : 52.3  Vital Signs:   Intake/Output from previous day: 02/05 0701 - 02/06 0700 In: -  Out: 100 [Urine:100] Intake/Output from this shift:    Labs:  Recent Labs  07/27/13 0900 07/28/13 0549 07/29/13 0614  WBC 6.9 7.4 10.6*  HGB 12.0* 11.0* 11.1*  PLT 176 161 241  CREATININE 1.19 1.36* 1.29   Estimated Creatinine Clearance: 34.3 ml/min (by C-G formula based on Cr of 1.29). No results found for this basename: VANCOTROUGH, Corlis Leak, VANCORANDOM, GENTTROUGH, GENTPEAK, GENTRANDOM, TOBRATROUGH, TOBRAPEAK, TOBRARND, AMIKACINPEAK, AMIKACINTROU, AMIKACIN,  in the last 72 hours   Microbiology: Recent Results (from the past 720 hour(s))  MRSA PCR SCREENING     Status: None   Collection Time    07/25/13  5:57 AM      Result Value Range Status   MRSA by PCR NEGATIVE  NEGATIVE Final   Comment:            The GeneXpert MRSA Assay (FDA     approved for NASAL specimens     only), is one component of a     comprehensive MRSA colonization     surveillance program. It is not     intended to diagnose MRSA     infection nor to guide or     monitor treatment for     MRSA infections.    Medical History: Past Medical History  Diagnosis Date  . Hypertension   . Hypercholesterolemia   . Cancer     prostate    Medications:  Scheduled:  . dexamethasone  8 mg Intravenous Q6H  . famotidine (PEPCID) IV  20 mg Intravenous Q12H  . furosemide  40 mg Intravenous Once  . magnesium hydroxide  30 mL Oral BID AC  . piperacillin-tazobactam (ZOSYN)  IV  3.375 g Intravenous Q8H  . sodium chloride  3 mL Intravenous Q12H  . vancomycin  750 mg Intravenous Q24H   Assessment: 80 yo M admitted 2/2 with small bowel obstruction.  He was now developed acute hypoxic  respiratory failure.  CXR + pneumonia vs. edema.   WBC is increasing.  Pt is afebrile.  He is starting on empiric antibiotics for HCAP vs. Asp PNA.   Renal function is at patient's baseline.   Vancomycin 2/6>> Zosyn 2/6>>  Goal of Therapy:  Vancomycin trough level 15-20 mcg/ml  Plan:  Zosyn 3.375gm IV Q8h to be infused over 4hrs Vancomycin 750mg  IV q24h Check Vancomycin trough at steady state Monitor renal function and cx data   Biagio Borg 07/29/2013,1:57 PM

## 2013-07-29 NOTE — Progress Notes (Signed)
Patient is sedated and patient's wife is not in the room. Patient was transferred back to ICU [place] today for recurrence of stridor and respiratory distress. Chest film revealed bilateral infiltrates and small pleural effusion. Patient begun on vancomycin and Zosyn for possible pneumonia. He is now receiving dexamethasone and switch from pantoprazole to famotidine just in case. No melena or rectal bleeding reported. Abdominal exam reveals normal bowel sounds and soft and nontender abdomen. No LE edema noted.  Lab data; WBC 10.6, H&H 11.1 and 34.0 and platelet count 241K  Assessment; #1. Small bowel obstruction which was the reason for hospitalization has resolved but he has developed other issues. #2. Upper GI bleed. He had 500 mL of coffee-ground on placement of NG tube on arrival. No evidence of active bleeding. EGD is on hold. Patient currently on famotidine IV. #3. Respiratory failure also but he secondary to pneumonia and he is on broad-spectrum antibiotics. He certainly could have aspirated and I wonder if stridor is results of that rather than NG tube trauma. #4. Encephalopathy. This was thought to be secondary to steroids. He had unenhanced head CT on admission which did not revealed no acute abnormalities.  Recommendations; Continue famotidine 20 mg IV every 12 hours. Delay EGD until neurologic and respiratory status improves.

## 2013-07-29 NOTE — Progress Notes (Signed)
INITIAL NUTRITION ASSESSMENT  DOCUMENTATION CODES Per approved criteria  -Not Applicable   INTERVENTION: Follow for diet advancement and need for ONS  NUTRITION DIAGNOSIS: Inadequate oral intake related to altered GI function as evidenced by NPO.   Goal: Pt will meet >90% of estimated nutritional needs  Monitor:  Diet advancement, PO intake, labs, weight changes, akin assessment, I/O's, changes in status  Reason for Assessment: LOS  80 y.o. male  Admitting Dx: SBO (small bowel obstruction)  ASSESSMENT: Pt admitted for SBO. Pt removed ngt on 07/26/13 himself and as a result developed stridor. He has had poor respiratory status since this event. EGD has been postponed until status is improved.  PTA pt with fair appetite. Pt was able to be advanced to a dysphagia 3 diet and tolerated very well.  Wt hx reveals a 4# (3.15) wt loss x 3 months, which is not clinically significant.  Height: Ht Readings from Last 1 Encounters:  07/25/13 5\' 1"  (1.549 m)    Weight: Wt Readings from Last 1 Encounters:  07/28/13 125 lb 3.5 oz (56.8 kg)    Ideal Body Weight: 112#  % Ideal Body Weight: 11%  Wt Readings from Last 10 Encounters:  07/28/13 125 lb 3.5 oz (56.8 kg)  02/19/13 129 lb (58.514 kg)    Usual Body Weight: 129#  % Usual Body Weight: 97%  BMI:  Body mass index is 23.67 kg/(m^2). Meets criteria for normal weight.   Estimated Nutritional Needs: Kcal: 1250-1420 daily Protein: 45-57 grams daily Fluid: 1.3-1.4 L daily  Skin: Intact  Diet Order: NPO  EDUCATION NEEDS: -Education not appropriate at this time  No intake or output data in the 24 hours ending 07/29/13 1606  Last BM: 07/28/13   Labs:   Recent Labs Lab 07/27/13 0900 07/28/13 0549 07/29/13 0614  NA 140 140 139  K 3.9 3.8 3.2*  CL 100 100 100  CO2 26 26 25   BUN 15 24* 23  CREATININE 1.19 1.36* 1.29  CALCIUM 8.8 8.8 8.5  GLUCOSE 139* 159* 164*    CBG (last 3)  No results found for this  basename: GLUCAP,  in the last 72 hours  Scheduled Meds: . dexamethasone  8 mg Intravenous Q6H  . famotidine (PEPCID) IV  20 mg Intravenous Q12H  . magnesium hydroxide  30 mL Oral BID AC  . piperacillin-tazobactam (ZOSYN)  IV  3.375 g Intravenous Q8H  . sodium chloride  3 mL Intravenous Q12H  . vancomycin  750 mg Intravenous Q24H    Continuous Infusions: . dextrose 5 % and 0.45% NaCl Stopped (07/27/13 1200)    Past Medical History  Diagnosis Date  . Hypertension   . Hypercholesterolemia   . Cancer     prostate    Past Surgical History  Procedure Laterality Date  . Prostatectomy      Douglas Mckay A. Jimmye Norman, RD, LDN Pager: [phone]

## 2013-07-29 NOTE — Progress Notes (Signed)
He has developed stridor again. I discussed his situation with Dr. Sarajane Jews who is his attending. I think this is probably rebound and I agree not likely related to his medications. I agree with restarting Decadron using racemic epinephrine and follow. He may need a slower Decadron taper than what I had initially thought

## 2013-07-29 NOTE — Progress Notes (Addendum)
Per night report Pt resumed stridor last night and was put on 2L O2 via [place]. Pt is sating at 98% but is still exhibiting stridor and agitation. Pt is obtunded an not arousable even with sternal rub. MD has been made aware and orders received. Will administer IV steroids and breathing treatments. Will transfer pt to ICU when bed becomes available. Will continue to monitor.

## 2013-07-29 NOTE — Progress Notes (Addendum)
[facility] PROGRESS NOTE  Douglas Mckay DZH:[id number] DOB: 06/10/1932 DOA: 07/24/2013 PCP: Marjo Bicker, MD  Addendum 1300: A little better per wife. Agitated earlier per RN. O2 sats high 90s but was placed on NRB. Clinically perhaps slightly better, normal respiratory rate and effort. Coarse BS remain, stridor somewhat quieter. Patient responds briefly to voice. CXR shows bilateral infiltrates suspicious for edema or pneumonia. Discussed with Dr. Luan Pulling. Will recheck CT neck to reassess airway, check ABG, give Lasix and start IV abx. Further management based on results. Discussed with wife at bedside.  Summary: 80 year old man presented to the hospital with complaints of one week of abdominal pain. At the time of admission he was noted to be quite tremulous. He was admitted for high-grade small bowel obstruction seen on CT, seen in consultation with general surgery. NG tube was placed which returned dark blood. He was followed by surgery, fortunately bowel obstruction appears to be resolving and no intervention is planned. He removed the NG tube himself evening 2/3 and developed stridor from this.   Assessment/Plan: 1. Acute hypoxic respiratory failure. Mild hypoxia, no acute distress pressure has returned. 2. Stridor. Now with recurrence. Though iinitially to be secondary to patient's self removal of NG tube. No likely offending medications. May be secondary to rebound. 3. Acute encephalopathy. Likely multifactorial in nature, lack of sleep, fatigue, acute illness, possibly related to steroids which unfortunately cannot be avoided at this point. 4. Small bowel obstruction. Clinically appears to be resolved. Surgery recommended observation. 5. UGIB, coffee-ground emesis. No recurrence. Hemoglobin is stable. GI plans upper endoscopy eventually. 6. Acute renal failure resolved. Continue IV fluids. 7. Stable normocytic anemia. 8. Hypertension stable. 9. Intention tremor upper  extremities for one month. CT head negative. CT soft tissue neck suggested advanced cervical spine degenerative changes with moderate to severe degenerative spinal stenosis. Suggest outpatient followup with neurosurgery. No focal neurologic deficits.   Recurrent stridor, etiology unclear, no recurrent vomiting noted. May be rebound. No likely offending medications.  Transfer to SDU, racemic epinephrine, restart decadron. Discussed with Dr. Luan Pulling. No evidence of airway compromise at this point.  Restraints to prevent self-harm, Ativan if needed.  Prognosis guarded. Discussed the above with wife at bedside.  Pending studies:     Code Status: full code DVT prophylaxis: SCDs Family Communication:  Disposition Plan: Pending  [name], MD  [facility]  Pager [phone] If 7PM-7AM, please contact night-coverage at [email or web address], password [facility] 07/29/2013, 8:20 AM  LOS: 5 days   Consultants:  General surgery  Gastroenterology  Pulmonology  Procedures:    Antibiotics:    HPI/Subjective: RN Judson Roch called me [phone] for stridor and confusion. Reports that night RN observed stridor last night, it does not appear anyone was notified. Wife reports stridor began sometime last night. No vomiting noted. Was confused yesterday evening and night and received Ativan 2mg  at 0335. Currently somnolent but SpO2 90s.  Objective: Filed Vitals:   07/28/13 1401 07/28/13 1810 07/28/13 1851 07/29/13 0729  BP: 146/81     Pulse: 115     Temp: 98.1 F (36.7 C)     TempSrc:      Resp: 18     Height:      Weight:      SpO2: 100% 96% 97% 98%    Intake/Output Summary (Last 24 hours) at 07/29/13 0820 Last data filed at 07/28/13 1102  Gross per 24 hour  Intake      0 ml  Output    100 ml  Net   -100 ml     Filed Weights   07/26/13 0500 07/27/13 0500 07/28/13 0500  Weight: 57.4 kg (126 lb 8.7 oz) 56.3 kg (124 lb 1.9 oz) 56.8 kg (125 lb 3.5 oz)    Exam:   Afebrile. Vital  signs stable. No hypoxia.  General: Appears restless but not agitated. Somnolent Does not respond to commands. Appears acutely ill but nontoxic.  Cardiovascular regular rate and rhythm. No murmur, rub or gallop. No lower extremity edema.  Respiratory inspiratory stridor, no wheezes, rales or rhonchi. Fair air movement. Normal respiratory effort and rate.  Musculoskeletal moves all extremities spontaneously.  Neurologic no focal deficits noted. Exam somewhat limited.  Psychiatric cannot assess  Skin appears grossly unremarkable  ENT: No lip or tongue edema. No neck swelling.  Data Reviewed:  Potassium 3.2. BMP otherwise unremarkable  CBC noted, mild WBC, recently treated with steroids.   CXR with improved aeration both longs.  Scheduled Meds: . famotidine (PEPCID) IV  20 mg Intravenous Q12H  . magnesium hydroxide  30 mL Oral BID AC  . pantoprazole (PROTONIX) IV  40 mg Intravenous Q12H  . sodium chloride  3 mL Intravenous Q12H   Continuous Infusions: . dextrose 5 % and 0.45% NaCl Stopped (07/27/13 1200)  . dextrose 5 % and 0.45% NaCl 75 mL/hr at 07/29/13 0230    Principal Problem:   SBO (small bowel obstruction) Active Problems:   HTN (hypertension), benign   Hypercholesteremia   Dehydration   Stridor   Acute respiratory failure with hypoxia   Acute encephalopathy   UGIB (upper gastrointestinal bleed)   Acute renal failure   Normocytic anemia   Time spent 35 minutes

## 2013-07-29 NOTE — Progress Notes (Signed)
Patient has improved. Stridor or less pronounced. Oxygen saturation 100%. Normal respiratory effort on examination. He is more awake and interactive. ABG was reassuring his CT of the neck showed improved patency of supraglottic airway with near complete resolution of supraglottic edema.  At this point he appears much better. Continue steroids, racemic epinephrine as needed, empiric antibiotics. Continue to monitor in SDU.  [name], MD [facility] [phone]

## 2013-07-29 NOTE — Progress Notes (Signed)
Pt transferred to ICU via bed. Pt still experiencing stridor, but sating 97% on nonrebreather at 15l/min of O2. Report given to [facility].

## 2013-07-30 DIAGNOSIS — K922 Gastrointestinal hemorrhage, unspecified: Secondary | ICD-10-CM

## 2013-07-30 DIAGNOSIS — J690 Pneumonitis due to inhalation of food and vomit: Secondary | ICD-10-CM

## 2013-07-30 LAB — CBC
HEMATOCRIT: 34.9 % — AB (ref 39.0–52.0)
HEMOGLOBIN: 11.5 g/dL — AB (ref 13.0–17.0)
MCH: 27 pg (ref 26.0–34.0)
MCHC: 33 g/dL (ref 30.0–36.0)
MCV: 81.9 fL (ref 78.0–100.0)
Platelets: 178 10*3/uL (ref 150–400)
RBC: 4.26 MIL/uL (ref 4.22–5.81)
RDW: 14.6 % (ref 11.5–15.5)
WBC: 6.9 10*3/uL (ref 4.0–10.5)

## 2013-07-30 LAB — BASIC METABOLIC PANEL
BUN: 19 mg/dL (ref 6–23)
CHLORIDE: 97 meq/L (ref 96–112)
CO2: 30 meq/L (ref 19–32)
Calcium: 8.8 mg/dL (ref 8.4–10.5)
Creatinine, Ser: 1.38 mg/dL — ABNORMAL HIGH (ref 0.50–1.35)
GFR calc Af Amer: 55 mL/min — ABNORMAL LOW (ref >90)
GFR calc non Af Amer: 47 mL/min — ABNORMAL LOW (ref >90)
GLUCOSE: 129 mg/dL — AB (ref 70–99)
POTASSIUM: 3.6 meq/L — AB (ref 3.7–5.3)
SODIUM: 140 meq/L (ref 137–147)

## 2013-07-30 MED ORDER — SODIUM CHLORIDE 0.9 % IV SOLN
INTRAVENOUS | Status: DC
  Administered 2013-07-30 – 2013-07-31 (×2): via INTRAVENOUS

## 2013-07-30 MED ORDER — HALOPERIDOL LACTATE 5 MG/ML IJ SOLN
5.0000 mg | Freq: Once | INTRAMUSCULAR | Status: AC
  Administered 2013-07-30: 5 mg via INTRAVENOUS
  Filled 2013-07-30: qty 1

## 2013-07-30 MED ORDER — LORAZEPAM 0.5 MG PO TABS
0.5000 mg | ORAL_TABLET | Freq: Four times a day (QID) | ORAL | Status: DC | PRN
  Administered 2013-07-30 – 2013-07-31 (×2): 0.5 mg via ORAL
  Filled 2013-07-30 (×2): qty 1

## 2013-07-30 MED ORDER — LORAZEPAM 2 MG/ML IJ SOLN
1.0000 mg | Freq: Once | INTRAMUSCULAR | Status: AC
  Administered 2013-07-30: 1 mg via INTRAVENOUS
  Filled 2013-07-30: qty 1

## 2013-07-30 MED ORDER — DEXAMETHASONE SODIUM PHOSPHATE 4 MG/ML IJ SOLN
4.0000 mg | Freq: Four times a day (QID) | INTRAMUSCULAR | Status: AC
  Administered 2013-07-30 – 2013-07-31 (×4): 4 mg via INTRAVENOUS
  Filled 2013-07-30 (×4): qty 1

## 2013-07-30 NOTE — Progress Notes (Signed)
PT appears to be making upper airway noise. It is questionable if it is stridor, it appears to be more of a snoring type noise coming from his sinus region. If it was stridor he should  bark when coughing which he does not. Lungs appear decreased. Suspect sinus swelling from ng insertion and removal ( by him). He seems to breath through his nose only and not through his mouth.

## 2013-07-30 NOTE — Progress Notes (Signed)
He seems to be doing better today. Repeat CT showed that the swelling has resolved. He is still making some upper airway noise but looks generally better. He does still have bilateral infiltrates and I agree with Dr. Olevia Perches assessment that he probably aspirated causing this.  Agree with changing his medications as above continue with his other treatments.

## 2013-07-30 NOTE — Progress Notes (Signed)
[facility] PROGRESS NOTE  Douglas Mckay VOH:[id number] DOB: 08-11-1931 DOA: 07/24/2013 PCP: Marjo Bicker, MD  Summary: 80 year old man presented to the hospital with complaints of one week of abdominal pain. At the time of admission he was noted to be quite tremulous. He was admitted for high-grade small bowel obstruction seen on CT, seen in consultation with general surgery. NG tube was placed which returned dark blood. He was followed by surgery, fortunately bowel obstruction resolved with supportive care. Patient removed NG tube and developed stridor which improved with racemic epinephrine and steroids but then recurred off steroids. He is again improving. Plan continued observation until acute encephalopathy has resolved and respiratory status is completely stable.  Assessment/Plan: 1. Acute hypoxic respiratory failure. Improving with steroids and nebulizers. 2. Stridor. Better today, resolved edema by CT, respiratory status stable. Likely secondary to patient's self removal of NG tube or insertion. No likely offending medications.  3. Suspected pneumonia. Although bilateral aspiration is suspected. Will discontinue vancomycin. Continue Zosyn. 4. Acute encephalopathy. Likely multifactorial in nature, lack of sleep, fatigue, acute illness, likely related to steroids which unfortunately cannot be avoided at this point. 5. Small bowel obstruction. Resolved with conservative care. 6. UGIB, coffee-ground emesis. No recurrence. Hemoglobin is stable. GI plans upper endoscopy eventually but may be deferred for now. 7. Acute renal failure resolved. Stable.  8. Stable normocytic anemia. 9. Hypertension stable. 10. Intention tremor upper extremities for one month. CT head negative. CT soft tissue neck suggested advanced cervical spine degenerative changes with moderate to severe degenerative spinal stenosis. Suggest outpatient followup with neurosurgery. No focal neurologic deficits.   Wean  oxygen as tolerated. Overall he appears improved today. Continue steroids and we will plan to taper.   Continue Zosyn for suspected aspiration pneumonia. Discontinue vancomycin. Plan speech therapy evaluation. Check sputum culture.  Restraints to prevent self-harm, Ativan as needed to treat encephalopathy. Unfortunately do not think a slow steroid taper will be possible.  IV fluids for hydration.  Pulmonology evaluation appreciated.  Above discussed with wife at bedside.  Sputum culture   Code Status: full code DVT prophylaxis: SCDs Family Communication:  Disposition Plan: Pending  [name], MD  [facility]  Pager [phone] If 7PM-7AM, please contact night-coverage at [email or web address], password [facility] 07/30/2013, 10:06 AM  LOS: 6 days   Consultants:  General surgery  Gastroenterology  Pulmonology  Procedures:    Antibiotics:  Vancomycin 2/6 >>   Zosyn 2/6 >>   HPI/Subjective: No new issues overnight. Upper airway noise has improved. Oxygen saturations remained stable. Patient has been agitated but is more alert and responsive.  Objective: Filed Vitals:   07/30/13 0730 07/30/13 0800 07/30/13 0830 07/30/13 0900  BP: 128/95 136/50 116/74 131/94  Pulse:  95 100 86  Temp:  97.8 F (36.6 C)    TempSrc:  Oral    Resp: 14 19    Height:      Weight:      SpO2:  100% 96% 99%    Intake/Output Summary (Last 24 hours) at 07/30/13 1006 Last data filed at 07/30/13 2703  Gross per 24 hour  Intake    500 ml  Output    600 ml  Net   -100 ml     Filed Weights   07/26/13 0500 07/27/13 0500 07/28/13 0500  Weight: 57.4 kg (126 lb 8.7 oz) 56.3 kg (124 lb 1.9 oz) 56.8 kg (125 lb 3.5 oz)    Exam:   Afebrile. Vital signs stable. Oxygen saturation high 90s on  nasal cannula.  General: He appears somewhat restless, nontoxic, upper airway noise is significantly diminished today. Nontoxic but still ill.  Cardiovascular regular rate and rhythm. No murmur, rub  or gallop. No lower extremity edema.  Telemetry sinus rhythm  Respiratory clear to auscultation bilaterally. No wheezes, rales or rhonchi. Normal respiratory effort. Upper airway noise persists but is much improved over yesterday.  Abdomen soft nontender nondistended  Musculoskeletal grossly unremarkable, appears to have excellent strength in the arms and legs  Neurologic grossly nonfocal  Psychiatric he remains confused but does speak today and is more alert.  Data Reviewed:  Potassium 3.6. Creatinine slightly elevated at 1.3. Appears stable.  CBC unremarkable. Hemoglobin is stable at 1.5. White blood cell count now normal.  Scheduled Meds: . famotidine (PEPCID) IV  20 mg Intravenous Q12H  . magnesium hydroxide  30 mL Oral BID AC  . piperacillin-tazobactam (ZOSYN)  IV  3.375 g Intravenous Q8H  . sodium chloride  3 mL Intravenous Q12H  . vancomycin  750 mg Intravenous Q24H   Continuous Infusions: . sodium chloride 10 mL/hr at 07/30/13 1002  . dextrose 5 % and 0.45% NaCl Stopped (07/27/13 1200)    Principal Problem:   SBO (small bowel obstruction) Active Problems:   HTN (hypertension), benign   Hypercholesteremia   Dehydration   Stridor   Acute respiratory failure with hypoxia   Acute encephalopathy   UGIB (upper gastrointestinal bleed)   Acute renal failure   Normocytic anemia   Aspiration pneumonia   Time spent 20 minutes

## 2013-07-30 NOTE — Progress Notes (Signed)
No nausea or vomiting. No stool in the past 24 hours. Intermittently confused. Remains n.p.o. Speech therapy evaluation pending. Hemoglobin remains stable in the 11 range     Vital signs in last 24 hours: Temp:  [97.4 F (36.3 C)-98.6 F (37 C)] 97.6 F (36.4 C) (02/07 1200) Pulse Rate:  [82-123] 86 (02/07 0900) Resp:  [14-30] 19 (02/07 0800) BP: (88-186)/(50-138) 131/94 mmHg (02/07 0900) SpO2:  [85 %-100 %] 99 % (02/07 0900) FiO2 (%):  [40 %-100 %] 40 % (02/06 2347) Last BM Date: 07/28/13 General:   Awake. Appears comfortable in no acute distress Abdomen: Abdomen full. Bowel sounds are present. Abdomen is soft and nontender    Intake/Output from previous day: 02/06 0701 - 02/07 0700 In: 500 [IV Piggyback:500] Out: 600 [Urine:600] Intake/Output this shift:    Lab Results:  Recent Labs  07/28/13 0549 07/29/13 0614 07/30/13 0800  WBC 7.4 10.6* 6.9  HGB 11.0* 11.1* 11.5*  HCT 33.1* 34.0* 34.9*  PLT 161 241 178   BMET  Recent Labs  07/28/13 0549 07/29/13 0614 07/30/13 0800  NA 140 139 140  K 3.8 3.2* 3.6*  CL 100 100 97  CO2 26 25 30   GLUCOSE 159* 164* 129*  BUN 24* 23 19  CREATININE 1.36* 1.29 1.38*  CALCIUM 8.8 8.5 8.8      Impression/ Recommendations:  Coffee-ground emesis in the setting of recent small bowel obstruction. Patient remains hemodynamically stable.  Remains n.p.o. await speech therapy evaluation. Speech therapy evaluation to be reviewed as it becomes available. Tentatively plan for an eventual EGD prior to discharge.

## 2013-07-31 DIAGNOSIS — J690 Pneumonitis due to inhalation of food and vomit: Secondary | ICD-10-CM

## 2013-07-31 DIAGNOSIS — N179 Acute kidney failure, unspecified: Secondary | ICD-10-CM

## 2013-07-31 LAB — BASIC METABOLIC PANEL
BUN: 24 mg/dL — ABNORMAL HIGH (ref 6–23)
CALCIUM: 9.2 mg/dL (ref 8.4–10.5)
CO2: 31 meq/L (ref 19–32)
CREATININE: 1.52 mg/dL — AB (ref 0.50–1.35)
Chloride: 99 mEq/L (ref 96–112)
GFR calc Af Amer: 48 mL/min — ABNORMAL LOW (ref >90)
GFR calc non Af Amer: 42 mL/min — ABNORMAL LOW (ref >90)
GLUCOSE: 158 mg/dL — AB (ref 70–99)
Potassium: 3.4 mEq/L — ABNORMAL LOW (ref 3.7–5.3)
Sodium: 143 mEq/L (ref 137–147)

## 2013-07-31 LAB — PHOSPHORUS: Phosphorus: 3.6 mg/dL (ref 2.3–4.6)

## 2013-07-31 LAB — MAGNESIUM: MAGNESIUM: 2.6 mg/dL — AB (ref 1.5–2.5)

## 2013-07-31 MED ORDER — LORAZEPAM 2 MG/ML IJ SOLN
1.0000 mg | Freq: Four times a day (QID) | INTRAMUSCULAR | Status: DC | PRN
Start: 2013-07-31 — End: 2013-08-04
  Administered 2013-07-31 – 2013-08-03 (×10): 1 mg via INTRAVENOUS
  Filled 2013-07-31 (×11): qty 1

## 2013-07-31 MED ORDER — LORAZEPAM 2 MG/ML IJ SOLN
INTRAMUSCULAR | Status: AC
  Filled 2013-07-31: qty 1

## 2013-07-31 MED ORDER — SODIUM CHLORIDE 0.9 % IV SOLN
1.5000 g | Freq: Two times a day (BID) | INTRAVENOUS | Status: DC
  Administered 2013-07-31 – 2013-08-01 (×2): 1.5 g via INTRAVENOUS
  Filled 2013-07-31 (×5): qty 1.5

## 2013-07-31 MED ORDER — LORAZEPAM 2 MG/ML IJ SOLN
2.0000 mg | Freq: Once | INTRAMUSCULAR | Status: AC
  Administered 2013-07-31: 2 mg via INTRAMUSCULAR

## 2013-07-31 MED ORDER — LORAZEPAM 2 MG/ML IJ SOLN
INTRAMUSCULAR | Status: AC
  Administered 2013-07-31: 1 mg
  Filled 2013-07-31: qty 1

## 2013-07-31 MED ORDER — HALOPERIDOL LACTATE 5 MG/ML IJ SOLN
5.0000 mg | Freq: Once | INTRAMUSCULAR | Status: AC
  Administered 2013-07-31: 5 mg via INTRAVENOUS
  Filled 2013-07-31: qty 1

## 2013-07-31 MED ORDER — LORAZEPAM 2 MG/ML IJ SOLN
1.0000 mg | Freq: Once | INTRAMUSCULAR | Status: AC
  Administered 2013-07-31: 1 mg via INTRAVENOUS

## 2013-07-31 MED ORDER — LORAZEPAM 2 MG/ML IJ SOLN: 1.0000 mg | Freq: Once | INTRAMUSCULAR | Status: AC

## 2013-07-31 NOTE — Progress Notes (Signed)
He continues to have some upper airway noise. It is not classic stridor. He is improved. He has been on steroids and I agree with Dr. Everett Graff assessment that he's going to have to remain on steroids. He is very confused and I think that's an acute encephalopathy multi-factorial. He has what I believe is aspiration pneumonia which is improving.  Exam shows he still has some upper airway noise but is much less. His temp is 98.1 pulse 109 respirations 26 blood pressure 192/87 but this is after he's been somewhat agitated. His chest is pretty clear with some rales. His heart is regular. He does not have any edema. Central nervous system shows he still confused  He was admitted with small bowel obstruction and I believe he aspirated as a result of that. This has given him pneumonia and he had hypoxic respiratory failure with associated stridor. All of this seems to be improving. The small bowel obstruction seems to have resolved. He is being treated for the pneumonia. He has acute encephalopathy which could be related to his steroids but I don't think there's a way to avoid the use of that medication at this time.  Continue current treatments

## 2013-07-31 NOTE — Progress Notes (Signed)
ANTIBIOTIC CONSULT NOTE  Pharmacy Consult for Unasyn Indication: aspiration PNA  No Known Allergies  Patient Measurements: Height: 5\' 1"  (154.9 cm) Weight: 125 lb 3.5 oz (56.8 kg) IBW/kg (Calculated) : 52.3  Vital Signs: Temp: 98 F (36.7 C) (02/08 0000) Temp src: Axillary (02/08 0000) BP: 125/86 mmHg (02/08 0607) Pulse Rate: 100 (02/08 0614) Intake/Output from previous day: 02/07 0701 - 02/08 0700 In: 1140.3 [I.V.:890.3; IV Piggyback:250] Out: -  Intake/Output from this shift:    Labs:  Recent Labs  07/29/13 0614 07/30/13 0800 07/31/13 0754  WBC 10.6* 6.9  --   HGB 11.1* 11.5*  --   PLT 241 178  --   CREATININE 1.29 1.38* 1.52*   Estimated Creatinine Clearance: 29.2 ml/min (by C-G formula based on Cr of 1.52). No results found for this basename: VANCOTROUGH, Corlis Leak, VANCORANDOM, GENTTROUGH, GENTPEAK, GENTRANDOM, TOBRATROUGH, TOBRAPEAK, TOBRARND, AMIKACINPEAK, AMIKACINTROU, AMIKACIN,  in the last 72 hours   Microbiology: Recent Results (from the past 720 hour(s))  MRSA PCR SCREENING     Status: None   Collection Time    07/25/13  5:57 AM      Result Value Range Status   MRSA by PCR NEGATIVE  NEGATIVE Final   Comment:            The GeneXpert MRSA Assay (FDA     approved for NASAL specimens     only), is one component of a     comprehensive MRSA colonization     surveillance program. It is not     intended to diagnose MRSA     infection nor to guide or     monitor treatment for     MRSA infections.    Medical History: Past Medical History  Diagnosis Date  . Hypertension   . Hypercholesterolemia   . Cancer     prostate    Medications:  Scheduled:  . dexamethasone  4 mg Intravenous Q6H  . famotidine (PEPCID) IV  20 mg Intravenous Q12H  . magnesium hydroxide  30 mL Oral BID AC  . sodium chloride  3 mL Intravenous Q12H   Assessment: 80 yo M admitted 2/2 with small bowel obstruction.  He was now developed acute hypoxic respiratory failure.  CXR  + pneumonia vs. edema.  Aspiration suspected. Antibiotics are being narrowed to Unasyn.  Scr is trending up.  Output volume not recorded.   Unasyn 2/8>> Vancomycin 2/6>>2/7 Zosyn 2/6>>2/8  Goal of Therapy:  Eradicate infection.  Plan:  Unasyn 1.5gm IV q12h Monitor renal function and cx data  Duration of therapy per MD  Biagio Borg 07/31/2013,10:06 AM

## 2013-07-31 NOTE — Progress Notes (Signed)
Notified midlevel of pt confusion, agitation, pulling at tubes, wires attempting to get oob without assistance. New order recieved

## 2013-07-31 NOTE — Progress Notes (Signed)
Pt continues to be confused, agitation increasing, md notified and new order recieved

## 2013-07-31 NOTE — Progress Notes (Signed)
PT on side of bed confused nurse asked for neb to hopefully clear congestion in chest.

## 2013-07-31 NOTE — Progress Notes (Signed)
Patient still restless/agitated and pulling at tubes and clothes after PRN doses of Ativan and Haldol. Called Dr. Sarajane Jews to come reassess patient. Have tried other non pharmacological interventions including toileting, OOB to chair, offering urinal and standing up in room with assistance. Unable to reorient patient. Patient has been very confused all day.

## 2013-07-31 NOTE — Progress Notes (Signed)
Pt resting quietly in bed now

## 2013-07-31 NOTE — Progress Notes (Addendum)
PROGRESS NOTE  Douglas Mckay OHY:[id number] DOB: 16-Jun-1932 DOA: 07/24/2013 PCP: Marjo Bicker, MD  Addendum 1800: Chart reviewed again in detail. Per wife no hx of dementia. No hx of alcohol use. Patient developed confusion after initiation of steroids for stridor. With resolution of stridor encephalopathy resolved. Then again developed stridor and restarted on steroids and again encephalopathic. No focal deficits, excellent strength all extremities. Speaks in short sentences but remains confused. No fever. Clinical impression remains acute encephalopathy, ICU delirium, likely exacerbated lack of sleep, steroids and possibly pneumonia. Will continue abx and Ativan as needed. Have decreased steroids and hope to stop soon. Discussed again with wife.  Summary: 80 year old man presented to the hospital with complaints of one week of abdominal pain. At the time of admission he was noted to be quite tremulous. He was admitted for high-grade small bowel obstruction seen on CT, seen in consultation with general surgery. NG tube was placed which returned dark blood. He was followed by surgery, fortunately bowel obstruction resolved with supportive care. Patient removed NG tube and developed stridor which improved with racemic epinephrine and steroids but then recurred off steroids. Again resolved with steroids. Plan continued observation until acute encephalopathy has resolved and respiratory status is completely stable.  Assessment/Plan: 1. Acute hypoxic respiratory failure. Improving with steroids and nebulizers. 2. Stridor. Resolved clinically and radiographically. Likely secondary to patient's self removal of NG tube or insertion. No likely offending medications.  3. Suspected aspiration pneumonia. Minimal hypoxia, respiratory status stable. 4. Acute encephalopathy. Likely multifactorial in nature, ICU delirium, lack of sleep, likely related to steroids which unfortunately cannot be avoided at this  point. 5. Small bowel obstruction. Resolved with conservative care. 6. UGIB, coffee-ground emesis. No recurrence. Hemoglobin is stable. GI plans upper endoscopy eventually but may be deferred for now. 7. Acute renal failure, recurrent secondary to dehydration. 8. Stable normocytic anemia. 9. Hypertension stable. 10. Intention tremor upper extremities for one month. CT head negative. CT soft tissue neck suggested advanced cervical spine degenerative changes with moderate to severe degenerative spinal stenosis. Suggest outpatient followup with neurosurgery. No focal neurologic deficits.   Continue steroid taper to prevent rebound stridor, wean oxygen.  Change to Unasyn (Zosyn shortage, no reason to suspect Pseudomonas)  IVF  ST evaluation in AM  BMP in AM  Code Status: full code DVT prophylaxis: SCDs Family Communication:  Disposition Plan: Pending  [name], MD  [facility]  Pager [phone] If 7PM-7AM, please contact night-coverage at [email or web address], password [email or web address] 07/31/2013, 8:45 AM  LOS: 7 days   Consultants:  General surgery  Gastroenterology  Pulmonology  Procedures:    Antibiotics:  Vancomycin 2/6 >> 2/7  Zosyn 2/6 >> 2/8  Unasyn 2/8 >>  HPI/Subjective: Confused and agitated overnight. Stridor has resolved and respiratory status has improved. No vomiting.  Objective: Filed Vitals:   07/30/13 2300 07/31/13 0000 07/31/13 0607 07/31/13 0614  BP:   125/86   Pulse:    100  Temp:  98 F (36.7 C)    TempSrc:  Axillary    Resp: 15 16 18 16   Height:      Weight:      SpO2:    98%    Intake/Output Summary (Last 24 hours) at 07/31/13 0845 Last data filed at 07/31/13 0600  Gross per 24 hour  Intake 1140.34 ml  Output      0 ml  Net 1140.34 ml     Filed Weights   07/26/13 0500 07/27/13 0500 07/28/13 0500  Weight: 57.4  kg (126 lb 8.7 oz) 56.3 kg (124 lb 1.9 oz) 56.8 kg (125 lb 3.5 oz)    Exam:   Afebrile. Vital signs stable. Oxygen  saturation high 90s on nasal cannula.  General: Sitting in chair. Appears confused, attempting to get out of chair. He is however alert and follows commands.  Psych: confused but redirectable   CV: RRR, no m/r/g. No LE edema.  Telemetry SR  Resp: CTA bilaterally, no stridor, no w/r/r. Normal resp effort  Neuro: grossly non-focal, moves all extremities, face appears unremarkable  Abd: soft   Data Reviewed:  K+ 3.5  Creatinine up to 1.52 and BUN up to 24.  Scheduled Meds: . dexamethasone  4 mg Intravenous Q6H  . famotidine (PEPCID) IV  20 mg Intravenous Q12H  . magnesium hydroxide  30 mL Oral BID AC  . piperacillin-tazobactam (ZOSYN)  IV  3.375 g Intravenous Q8H  . sodium chloride  3 mL Intravenous Q12H   Continuous Infusions: . sodium chloride 50 mL/hr at 07/31/13 0600    Principal Problem:   SBO (small bowel obstruction) Active Problems:   HTN (hypertension), benign   Hypercholesteremia   Dehydration   Stridor   Acute respiratory failure with hypoxia   Acute encephalopathy   UGIB (upper gastrointestinal bleed)   Acute renal failure   Normocytic anemia   Aspiration pneumonia   Time spent 20 minutes

## 2013-08-01 ENCOUNTER — Inpatient Hospital Stay (HOSPITAL_COMMUNITY): Payer: Medicare Other

## 2013-08-01 LAB — CBC
HEMATOCRIT: 32.8 % — AB (ref 39.0–52.0)
HEMOGLOBIN: 10.7 g/dL — AB (ref 13.0–17.0)
MCH: 27.1 pg (ref 26.0–34.0)
MCHC: 32.6 g/dL (ref 30.0–36.0)
MCV: 83 fL (ref 78.0–100.0)
Platelets: 185 10*3/uL (ref 150–400)
RBC: 3.95 MIL/uL — ABNORMAL LOW (ref 4.22–5.81)
RDW: 14.4 % (ref 11.5–15.5)
WBC: 7 10*3/uL (ref 4.0–10.5)

## 2013-08-01 LAB — BASIC METABOLIC PANEL
BUN: 19 mg/dL (ref 6–23)
CHLORIDE: 108 meq/L (ref 96–112)
CO2: 30 mEq/L (ref 19–32)
Calcium: 8.7 mg/dL (ref 8.4–10.5)
Creatinine, Ser: 1.42 mg/dL — ABNORMAL HIGH (ref 0.50–1.35)
GFR calc Af Amer: 53 mL/min — ABNORMAL LOW (ref >90)
GFR calc non Af Amer: 45 mL/min — ABNORMAL LOW (ref >90)
Glucose, Bld: 103 mg/dL — ABNORMAL HIGH (ref 70–99)
Potassium: 3.6 mEq/L — ABNORMAL LOW (ref 3.7–5.3)
Sodium: 150 mEq/L — ABNORMAL HIGH (ref 137–147)

## 2013-08-01 LAB — URINE MICROSCOPIC-ADD ON

## 2013-08-01 LAB — URINALYSIS, ROUTINE W REFLEX MICROSCOPIC
Bilirubin Urine: NEGATIVE
Glucose, UA: NEGATIVE mg/dL
Leukocytes, UA: NEGATIVE
NITRITE: NEGATIVE
Protein, ur: NEGATIVE mg/dL
Specific Gravity, Urine: 1.02 (ref 1.005–1.030)
UROBILINOGEN UA: 0.2 mg/dL (ref 0.0–1.0)
pH: 8 (ref 5.0–8.0)

## 2013-08-01 MED ORDER — FAMOTIDINE IN NACL 20-0.9 MG/50ML-% IV SOLN
20.0000 mg | INTRAVENOUS | Status: DC
  Administered 2013-08-02 – 2013-08-04 (×3): 20 mg via INTRAVENOUS
  Filled 2013-08-01 (×4): qty 50

## 2013-08-01 MED ORDER — HYDRALAZINE HCL 20 MG/ML IJ SOLN: 5.0000 mg | Freq: Four times a day (QID) | INTRAMUSCULAR | Status: DC | PRN

## 2013-08-01 MED ORDER — SODIUM CHLORIDE 0.9 % IV SOLN
1.5000 g | Freq: Three times a day (TID) | INTRAVENOUS | Status: DC
  Administered 2013-08-01 – 2013-08-04 (×10): 1.5 g via INTRAVENOUS
  Filled 2013-08-01 (×13): qty 1.5

## 2013-08-01 MED ORDER — POTASSIUM CL IN DEXTROSE 5% 20 MEQ/L IV SOLN
20.0000 meq | INTRAVENOUS | Status: DC
  Administered 2013-08-01 – 2013-08-02 (×3): 20 meq via INTRAVENOUS

## 2013-08-01 NOTE — Progress Notes (Signed)
UR chart review completed.  

## 2013-08-01 NOTE — Progress Notes (Signed)
ANTIBIOTIC CONSULT NOTE  Pharmacy Consult for Unasyn Indication: aspiration PNA  No Known Allergies  Patient Measurements: Height: 5\' 1"  (154.9 cm) Weight: 115 lb 1.3 oz (52.2 kg) IBW/kg (Calculated) : 52.3  Vital Signs: Temp: 98.1 F (36.7 C) (02/09 0800) Temp src: Axillary (02/09 0800) BP: 183/126 mmHg (02/09 0254) Pulse Rate: 112 (02/09 0255) Intake/Output from previous day: 02/08 0701 - 02/09 0700 In: 1100 [I.V.:900; IV Piggyback:200] Out: -  Intake/Output from this shift: Total I/O In: -  Out: 100 [Urine:100]  Labs:  Recent Labs  07/30/13 0800 07/31/13 0754 08/01/13 0806  WBC 6.9  --  7.0  HGB 11.5*  --  10.7*  PLT 178  --  185  CREATININE 1.38* 1.52* 1.42*   Estimated Creatinine Clearance: 31.1 ml/min (by C-G formula based on Cr of 1.42). No results found for this basename: VANCOTROUGH, Corlis Leak, VANCORANDOM, GENTTROUGH, GENTPEAK, GENTRANDOM, TOBRATROUGH, TOBRAPEAK, TOBRARND, AMIKACINPEAK, AMIKACINTROU, AMIKACIN,  in the last 72 hours   Microbiology: Recent Results (from the past 720 hour(s))  MRSA PCR SCREENING     Status: None   Collection Time    07/25/13  5:57 AM      Result Value Range Status   MRSA by PCR NEGATIVE  NEGATIVE Final   Comment:            The GeneXpert MRSA Assay (FDA     approved for NASAL specimens     only), is one component of a     comprehensive MRSA colonization     surveillance program. It is not     intended to diagnose MRSA     infection nor to guide or     monitor treatment for     MRSA infections.    Medical History: Past Medical History  Diagnosis Date  . Hypertension   . Hypercholesterolemia   . Cancer     prostate    Medications:  Scheduled:  . ampicillin-sulbactam (UNASYN) IV  1.5 g Intravenous Q12H  . famotidine (PEPCID) IV  20 mg Intravenous Q12H  . magnesium hydroxide  30 mL Oral BID AC  . sodium chloride  3 mL Intravenous Q12H   Assessment: 80 yo M admitted 2/2 with small bowel obstruction.  He  was now developed acute hypoxic respiratory failure.  CXR + pneumonia vs. edema.  Aspiration suspected. Antibiotics are being narrowed to Unasyn.  Scr improved today.  Output volume not recorded.   Unasyn 2/8>> Vancomycin 2/6>>2/7 Zosyn 2/6>>2/8  Goal of Therapy:  Eradicate infection.  Plan:  Increase Unasyn 1.5gm IV q8h Monitor renal function and cx data  Duration of therapy per MD  Biagio Borg 08/01/2013,11:19 AM

## 2013-08-01 NOTE — Progress Notes (Signed)
Change in breathing pattern, asked several staff members who were present last week with Douglas Mckay's stridor to listen for me.  Both stated mild compared to that night, and very intermittent. Not occuring with every breath, only occasionally.  Monitoring closely.  Patient remains confused, appears to be trying to sleep at present, restless.  Wife at bedside.  Assured her I would watch him closely.

## 2013-08-01 NOTE — Progress Notes (Addendum)
PROGRESS NOTE  Douglas Mckay GNF:[id number] DOB: [birth year] DOA: 07/24/2013 PCP: Marjo Bicker, MD  Addendum: Seen this PM 1800. Discussed with wife at bedside. Patient has rested more today but still restless. AF VSS but hypertensive. CT no acute abnormalities. CT sinus unremarkable. Urinalysis clear. Plan continue abx, supportive care for delirium.  Summary: 80 year old man presented to the hospital with complaints of one week of abdominal pain. At the time of admission he was noted to be quite tremulous. He was admitted for high-grade small bowel obstruction seen on CT, seen in consultation with general surgery. NG tube was placed which returned dark blood. He was followed by surgery, fortunately bowel obstruction resolved with supportive care. Patient removed NG tube and developed stridor which improved with racemic epinephrine and steroids but then recurred off steroids. Again resolved with steroids. Plan continued observation until acute encephalopathy has resolved and respiratory status is completely stable.  Assessment/Plan: 1. Acute hypoxic respiratory failure. Stable. 2. Stridor. Resolved clinically and radiographically. Likely secondary to patient's self removal of NG tube or insertion. No likely offending medications.  3. Suspected aspiration pneumonia. Minimal hypoxia, respiratory status stable. Continue empiric antibiotics. 4. Acute encephalopathy. Likely multifactorial in nature, ICU delirium, lack of sleep, acute illness, likely related to steroids. He has received 48 hours of steroids. He has some upper inspiratory wheeze but not classic stridor. Will monitor off steroids today. 5. Small bowel obstruction. Resolved with conservative care. 6. UGIB, coffee-ground emesis. No recurrence. Hemoglobin is stable. GI plans upper endoscopy eventually but deferred until clinical improvement. 7. Acute renal failure, resolved. hypernatremia secondary to dehydration.  8. Stable normocytic  anemia. 9. Hypertension. Somewhat labile. 10. Intention tremor upper extremities for one month. CT head negative. CT soft tissue neck suggested advanced cervical spine degenerative changes with moderate to severe degenerative spinal stenosis. Suggest outpatient followup with neurosurgery. No focal neurologic deficits.   Overall no clinical change, laboratory studies are fairly unremarkable. The patient has a nonfocal exam but remains encephalopathic requiring Ativan to prevent self-harm. Suspect more time is needed but will repeat CT head and check CT sinuses to rule out new phenomenon.  Continue empiric antibiotics for suspected aspiration pneumonia.   monitor off steroids today.  PRN labetalol   Change IVF to D5W.  Nutrition would become an issue unless this resolves in the next 24-48 hours.  Discussed with wife in detail at bedside. The patient's prognosis remains guarded.  Code Status: full code DVT prophylaxis: SCDs Family Communication:  Disposition Plan: Pending  [name], MD  [facility]  Pager [phone] If 7PM-7AM, please contact night-coverage at [email or web address], password [facility] 08/01/2013, 9:16 AM  LOS: 8 days   Consultants:  General surgery  Gastroenterology  Pulmonology  Procedures:    Antibiotics:  Vancomycin 2/6 >> 2/7  Zosyn 2/6 >> 2/8  Unasyn 2/8 >>  HPI/Subjective: Remains confused. Agitated. Requiring restraints to prevent self-harm. Unable to offer any history. No focal deficits per nursing.  Objective: Filed Vitals:   08/01/13 0256 08/01/13 0400 08/01/13 0500 08/01/13 0800  BP:      Pulse:      Temp:  97.9 F (36.6 C)  98.1 F (36.7 C)  TempSrc:  Axillary  Axillary  Resp: 24     Height:      Weight:   52.2 kg (115 lb 1.3 oz)   SpO2:        Intake/Output Summary (Last 24 hours) at 08/01/13 0916 Last data filed at 08/01/13 0900  Gross per 24 hour  Intake    950 ml  Output    100 ml  Net    850 ml     Filed Weights     07/27/13 0500 07/28/13 0500 08/01/13 0500  Weight: 56.3 kg (124 lb 1.9 oz) 56.8 kg (125 lb 3.5 oz) 52.2 kg (115 lb 1.3 oz)    Exam:   Afebrile. Vital signs stable. Oxygen saturation high 90s on nasal cannula.  General: Afebrile, vital signs are stable. Hypertensive. He appears acutely ill but nontoxic. He does not participate in examination.  Neurologic: Cranial nerves appear grossly intact. Pupils equal, round, reactive to light.   Musculoskeletal. Moves all extremities well spontaneously. Excellent strength, appear symmetric. Cannot assess sensation.  Psychiatric illnesses. Remains confused. Very energetic but does not follow commands.  Respiratory clear to auscultation bilaterally. No wheezes, rales or rhonchi. Normal respiratory effort. No stridor but does have inspiratory noise suggestive of a snoring-type phenomenon.   Cardiovascular regular rate and rhythm. No murmur, rub or gallop. No lower extremity edema.  Abdomen soft nontender and nondistended. Positive bowel sounds.  Skin appears grossly unremarkable.   Data Reviewed:  K+ 3.6  Sodium now 150. Creatinine improved from 142. Magnesium and phosphorus were unremarkable yesterday.  Hemoglobin without significant change 10.7. No leukocytosis. No thrombocytopenia.  Scheduled Meds: . ampicillin-sulbactam (UNASYN) IV  1.5 g Intravenous Q12H  . famotidine (PEPCID) IV  20 mg Intravenous Q12H  . magnesium hydroxide  30 mL Oral BID AC  . sodium chloride  3 mL Intravenous Q12H   Continuous Infusions: . sodium chloride 50 mL/hr at 08/01/13 0000    Principal Problem:   SBO (small bowel obstruction) Active Problems:   HTN (hypertension), benign   Hypercholesteremia   Dehydration   Stridor   Acute respiratory failure with hypoxia   Acute encephalopathy   UGIB (upper gastrointestinal bleed)   Acute renal failure   Normocytic anemia   Aspiration pneumonia   Time spent 25 minutes

## 2013-08-01 NOTE — Progress Notes (Signed)
Notified Dr. Sarajane Jews that pts BP was currently 175/93, pt is resting well, with some periods of agitation. Pt does not need ativan at this time. Order received for prn BP medication. Nursing will continue to monitor pts BP and agitation and medicate as necessary. At this time I also informed Dr. Sarajane Jews that SLP eval will be done tomorrow morning per therapist, as pt is currently unable to tolerate po's anyway.   Marry Guan

## 2013-08-02 LAB — BASIC METABOLIC PANEL
BUN: 9 mg/dL (ref 6–23)
CALCIUM: 8.3 mg/dL — AB (ref 8.4–10.5)
CO2: 33 mEq/L — ABNORMAL HIGH (ref 19–32)
Chloride: 100 mEq/L (ref 96–112)
Creatinine, Ser: 1.15 mg/dL (ref 0.50–1.35)
GFR, EST AFRICAN AMERICAN: 68 mL/min — AB (ref >90)
GFR, EST NON AFRICAN AMERICAN: 59 mL/min — AB (ref >90)
Glucose, Bld: 122 mg/dL — ABNORMAL HIGH (ref 70–99)
Potassium: 3.2 mEq/L — ABNORMAL LOW (ref 3.7–5.3)
SODIUM: 142 meq/L (ref 137–147)

## 2013-08-02 MED ORDER — GUAIFENESIN 100 MG/5ML PO SOLN
600.0000 mg | Freq: Four times a day (QID) | ORAL | Status: DC
  Administered 2013-08-02 – 2013-08-04 (×7): 600 mg via ORAL
  Filled 2013-08-02 (×14): qty 30

## 2013-08-02 MED ORDER — GUAIFENESIN ER 600 MG PO TB12
1200.0000 mg | ORAL_TABLET | Freq: Two times a day (BID) | ORAL | Status: DC
  Filled 2013-08-02: qty 2

## 2013-08-02 MED ORDER — POTASSIUM CL IN DEXTROSE 5% 20 MEQ/L IV SOLN
20.0000 meq | INTRAVENOUS | Status: DC
  Administered 2013-08-02 – 2013-08-03 (×3): 20 meq via INTRAVENOUS

## 2013-08-02 NOTE — Progress Notes (Signed)
Pt appears to be resting comfortably until I do my assessment and then he becomes restless and fidgety and pulling at lines and equipment despite safety mittens, VSS, no c/o pain, low bed, hob self regulated, wife at bedside, call bell at pt's side, will cont to monitor

## 2013-08-02 NOTE — Evaluation (Signed)
Physical Therapy Evaluation Patient Details Name: Douglas Mckay MRN: [id number] DOB: 03-23-1932 Today's Date: 08/02/2013 Time: 9381-8299 PT Time Calculation (min): 35 min  PT Assessment / Plan / Recommendation History of Present Illness  Pt is a 80 year old gentleman initially admitted with a SBO.  He has a hx of prostate CA and c/o generalized weakness and intention tremor at admission.  He has advanced cervical spinal stenosis.  He apparently aspirated after admission and developed respiratory failure with encephalopathy as well a pneumonia.  His SBO resoved without any surgery.  Pt was independent with all ADLs PTA and did not use any assistive device for gait.  Clinical Impression   Pt was seen for evaluation.  He was alert and cooperative, reasonably oriented.  Wife was present during my visit.  Pt's overall strength is WNL however he is deconditioned.  His dynamic standing balance is decreased and his gait pattern puts him at risk of falling.  He may benefit from the use of a walker for now and we will see if this improves his gait stability and endurance during the next PT visit.  While he would qualify for SNF at d/c, his wife very much wants him to go home at d/c.  She states that her niece is a CNA and can come over to help out.  He will therefore need HHPT at d/c.     PT Assessment  Patient needs continued PT services    Follow Up Recommendations  Home health PT    Does the patient have the potential to tolerate intense rehabilitation      Barriers to Discharge        Equipment Recommendations  Rolling walker with 5" wheels    Recommendations for Other Services     Frequency Min 3X/week    Precautions / Restrictions Precautions Precautions: Fall Restrictions Weight Bearing Restrictions: No   Pertinent Vitals/Pain       Mobility  Bed Mobility Overal bed mobility: Needs Assistance Bed Mobility: Supine to Sit Supine to sit: Supervision Transfers Overall  transfer level: Needs assistance Equipment used: None Transfers: Sit to/from Stand Sit to Stand: Min guard Ambulation/Gait Ambulation/Gait assistance: Min guard Ambulation Distance (Feet): 16 Feet Assistive device: None Gait Pattern/deviations: Trunk flexed;Narrow base of support;Shuffle Gait velocity interpretation: Below normal speed for age/gender General Gait Details: pt has decreased trunk rotation during gait    Exercises     PT Diagnosis: Difficulty walking;Abnormality of gait;Generalized weakness (pt is deconditioned)  PT Problem List: Decreased activity tolerance;Decreased mobility;Decreased safety awareness PT Treatment Interventions: Gait training;Functional mobility training;Therapeutic exercise     PT Goals(Current goals can be found in the care plan section) Acute Rehab PT Goals Patient Stated Goal: none stated PT Goal Formulation: With family Time For Goal Achievement: 08/16/13 Potential to Achieve Goals: Good  Visit Information  Last PT Received On: 08/02/13 History of Present Illness: Pt is a 80 year old gentleman initially admitted with a SBO.  He has a hx of prostate CA and c/o generalized weakness and intention tremor at admission.  He has advanced cervical spinal stenosis.  He apparently aspirated after admission and developed respiratory failure with encephalopathy as well a pneumonia.  His SBO resoved without any surgery.         Prior [place] expects to be discharged to:: Private residence Living Arrangements: Spouse/significant other Available Help at Discharge: Family;Available 24 hours/day Type of Home: House Home Access: Stairs to enter [employer] of Steps: 2  Entrance Stairs-Rails: Left Home Layout: One level Home Equipment: None Prior Function Level of Independence: Independent Communication Communication: No difficulties    Cognition  Cognition Arousal/Alertness: Awake/alert Behavior During  Therapy: WFL for tasks assessed/performed Overall Cognitive Status: Within Functional Limits for tasks assessed    Extremity/Trunk Assessment Lower Extremity Assessment Lower Extremity Assessment: Overall WFL for tasks assessed   Balance Balance Overall balance assessment: Needs assistance Sitting-balance support: No upper extremity supported;Feet supported Sitting balance-Leahy Scale: Good Standing balance support: No upper extremity supported;During functional activity Standing balance-Leahy Scale: Fair  End of Session PT - End of Session Equipment Utilized During Treatment: Gait belt Activity Tolerance: Patient tolerated treatment well Patient left: in chair;with call bell/phone within reach;with family/visitor present Nurse Communication: Mobility status  GP     [name] 08/02/2013, 4:13 PM

## 2013-08-02 NOTE — Plan of Care (Signed)
Problem: Phase II Progression Outcomes Goal: IV changed to normal saline lock Outcome: Not Progressing Pt has IVF with K+ and IV abx

## 2013-08-02 NOTE — Progress Notes (Signed)
He seems to be generally improving. He did have some upper airway noise last night and has a little this morning. It sounds more like he has secretions in his throat than like a stridor he had earlier.  He's not got a good cough effort but I'm going to try to put him on some Mucinex and a flutter valve and see if it helps him to produce some sputum. Otherwise he seems to be improving and I will plan to sign off at this point.   Thanks for allowing me to see him with you

## 2013-08-02 NOTE — Progress Notes (Signed)
PROGRESS NOTE  Douglas Mckay CHY:[id number] DOB: [birth year] DOA: 07/24/2013 PCP: Marjo Bicker, MD  Summary: 80 year old man presented to the hospital with complaints of one week of abdominal pain. At the time of admission he was noted to be quite tremulous. He was admitted for high-grade small bowel obstruction seen on CT, seen in consultation with general surgery. NG tube was placed which returned dark blood. He was followed by surgery, fortunately bowel obstruction resolved with supportive care. Patient removed NG tube and developed stridor which improved with racemic epinephrine and steroids but then recurred off steroids. Stridor again resolved with steroids but patient developed significant encephalopathy with administration of steroids at times. He has completed steroids, stridor has not recurred and his condition is improving. Speech therapy evaluation plan today, likely discharge in 48 hours if no recurrent stridor.  Assessment/Plan: 1. Acute hypoxic respiratory failure. Stable, secondary to pneumonia. 2. Stridor. Resolved clinically and radiographically. Likely secondary to patient's self removal of NG tube or insertion. No likely offending medications.  3. Suspected aspiration pneumonia. Minimal hypoxia, respiratory status stable. Continue empiric antibiotics. 4. Acute encephalopathy. Much improved today. Likely multifactorial in nature, ICU delirium, lack of sleep, acute illness, likely related to steroids.  5. Small bowel obstruction. Resolved with conservative care. 6. UGIB, coffee-ground emesis. No recurrence. Hemoglobin is stable. GI plans upper endoscopy eventually but deferred until clinical improvement. 7. Acute renal failure, resolved. hypernatremia secondary to dehydration.  8. Stable normocytic anemia. 9. Hypertension. Somewhat labile. 10. Intention tremor upper extremities for one month. CT head negative. CT soft tissue neck suggested advanced cervical spine degenerative  changes with moderate to severe degenerative spinal stenosis. Suggest outpatient followup with neurosurgery. No focal neurologic deficits.   Now improving. Monitor stepdown additional 24 hours, if no recurrent stridor and encephalopathy improving, plan transfer to floor. Discussed with wife at bedside.  Speech therapy evaluation today. Hopefully can start her diet.  Continue Unasyn for aspiration pneumonia.  Can likely transition to oral antibiotics 2/11.  BMP in the morning.  Code Status: full code DVT prophylaxis: SCDs Family Communication:  Disposition Plan: Pending  [name], MD  [facility]  Pager [phone] If 7PM-7AM, please contact night-coverage at [email or web address], password [facility] 08/02/2013, 9:15 AM  LOS: 9 days   Consultants:  General surgery  Gastroenterology  Pulmonology  Procedures:    Antibiotics:  Vancomycin 2/6 >> 2/7  Zosyn 2/6 >> 2/8  Unasyn 2/8 >>  HPI/Subjective: Had some upper airway noise last night. No issues today. He is awake and better per wife at bedside.  Objective: Filed Vitals:   08/01/13 2000 08/02/13 0000 08/02/13 0400 08/02/13 0730  BP: 149/88 172/78 100/72   Pulse: 55 65 66   Temp: 98 F (36.7 C) 98 F (36.7 C)  98.2 F (36.8 C)  TempSrc: Axillary Axillary  Axillary  Resp: 14 14 14    Height:      Weight:      SpO2: 91% 100% 99%     Intake/Output Summary (Last 24 hours) at 08/02/13 0915 Last data filed at 08/02/13 0400  Gross per 24 hour  Intake   1985 ml  Output      0 ml  Net   1985 ml     Filed Weights   07/27/13 0500 07/28/13 0500 08/01/13 0500  Weight: 56.3 kg (124 lb 1.9 oz) 56.8 kg (125 lb 3.5 oz) 52.2 kg (115 lb 1.3 oz)    Exam:   Afebrile. Vital signs stable. Oxygen saturation high 90s on nasal  cannula.  General: He appears calm and comfortable today. He is alert and able to answer some questions. He follows commands. Appears much better today.  Pupils, irises, lids appear  unremarkable.  ENT appears grossly unremarkable. Lips and tongue appear normal.  Neck appears unremarkable.  Cardiovascular regular rate and rhythm. No murmur, rub gallop. No lower extremity edema.  Telemetry sinus rhythm.  Respiratory clear to auscultation bilaterally. No wheezes, rales or rhonchi. No stridor. Normal respiratory effort.  Abdomen soft nontender nondistended.  Skin appears grossly normal.  Neurologic appears grossly normal, cranial nerves appear grossly intact. Nonfocal.  Musculoskeletal. Moves all extremities to command.  Data Reviewed:  K+ 3.2  Creatinine has normalized. Sodium now normal.  Scheduled Meds: . ampicillin-sulbactam (UNASYN) IV  1.5 g Intravenous Q8H  . famotidine (PEPCID) IV  20 mg Intravenous Q24H  . guaiFENesin  1,200 mg Oral BID  . magnesium hydroxide  30 mL Oral BID AC  . sodium chloride  3 mL Intravenous Q12H   Continuous Infusions: . dextrose 5 % with KCl 20 mEq / L 20 mEq (08/02/13 5631)    Principal Problem:   SBO (small bowel obstruction) Active Problems:   HTN (hypertension), benign   Hypercholesteremia   Dehydration   Stridor   Acute respiratory failure with hypoxia   Acute encephalopathy   UGIB (upper gastrointestinal bleed)   Acute renal failure   Normocytic anemia   Aspiration pneumonia   Time spent 20 minutes

## 2013-08-02 NOTE — Progress Notes (Signed)
NT attempted to feed pt lunch of dys 1 diet per SLP recommendation. Pt did not have a good appetite, was concerned about his wife eating and said he just didn't feel like eating anything at this time, but agreed to try again at dinner tonight. Marry Guan

## 2013-08-02 NOTE — Evaluation (Signed)
Clinical/Bedside Swallow Evaluation Patient Details  Name: Douglas Mckay MRN: [id number] Date of Birth: 10/24/1931  Today's Date: 08/02/2013 Time: 0930-1000 SLP Time Calculation (min): 30 min  Past Medical History:  Past Medical History  Diagnosis Date  . Hypertension   . Hypercholesterolemia   . Cancer     prostate   Past Surgical History:  Past Surgical History  Procedure Laterality Date  . Prostatectomy     HPI:  80 year old man presented to the hospital with complaints of one week of abdominal pain. At the time of admission he was noted to be quite tremulous. He was admitted for high-grade small bowel obstruction seen on CT, seen in consultation with general surgery. NG tube was placed which returned dark blood. He was followed by surgery, fortunately bowel obstruction resolved with supportive care. Patient removed NG tube and developed stridor which improved with racemic epinephrine and steroids but then recurred off steroids. Stridor again resolved with steroids but patient developed significant encephalopathy with administration of steroids at times. He has completed steroids, stridor has not recurred and his condition is improving   Assessment / Plan / Recommendation Clinical Impression  Douglas Mckay is the most alert he has been in the past few days and wife is encouraged by this. Although pt denied hunger/thirst, he was agreeable to po trials. Oral motor structures do not appear neurologically impaired, but pt does demonstrate slowing/weakness with movement. Pt appears to have mild stridor with inhalation before, during, and after po. Pt with decreased lingual movement with semisolids which results in mild oral/lingual residuals to which pt does not appear sensate to. Recommend initiating puree diet with thin liquids with SLP follow up for diet toleration and advancement as appropriate. Above to Dr. Sarajane Jews and Maretta Bees, RN.    Aspiration Risk  Mild    Diet Recommendation  Dysphagia 1 (Puree);Thin liquid   Liquid Administration via: Cup;Straw Medication Administration: Crushed with puree Supervision: Staff to assist with self feeding;Full supervision/cueing for compensatory strategies Compensations: Slow rate;Small sips/bites;Check for pocketing Postural Changes and/or Swallow Maneuvers: Out of bed for meals;Seated upright 90 degrees;Upright 30-60 min after meal    Other  Recommendations Oral Care Recommendations: Oral care BID Other Recommendations: Clarify dietary restrictions   Follow Up Recommendations   (pending progress over night)    Frequency and Duration min 2x/week  1 week   Pertinent Vitals/Pain     SLP Swallow Goals  See care plan.    Swallow Study Prior Functional Status   Pt lives at home with his wife.    General Date of Onset: 07/24/13 HPI: 80 year old man presented to the hospital with complaints of one week of abdominal pain. At the time of admission he was noted to be quite tremulous. He was admitted for high-grade small bowel obstruction seen on CT, seen in consultation with general surgery. NG tube was placed which returned dark blood. He was followed by surgery, fortunately bowel obstruction resolved with supportive care. Patient removed NG tube and developed stridor which improved with racemic epinephrine and steroids but then recurred off steroids. Stridor again resolved with steroids but patient developed significant encephalopathy with administration of steroids at times. He has completed steroids, stridor has not recurred and his condition is improving Type of Study: Bedside swallow evaluation Previous Swallow Assessment: none on record Diet Prior to this Study: NPO Temperature Spikes Noted: No Respiratory Status: Nasal cannula Behavior/Cognition: Alert;Cooperative;Requires cueing Oral Cavity - Dentition: Dentures, top Self-Feeding Abilities: Needs assist Patient Positioning: Upright in bed Baseline Vocal  Quality:  Hoarse;Low vocal intensity Volitional Cough: Weak Volitional Swallow: Unable to elicit    Oral/Motor/Sensory Function Overall Oral Motor/Sensory Function: Impaired Labial ROM: Reduced right;Reduced left Labial Symmetry: Within Functional Limits Labial Strength: Reduced Labial Sensation: Within Functional Limits Lingual ROM: Reduced right;Reduced left Lingual Symmetry: Within Functional Limits Lingual Strength: Reduced Lingual Sensation: Reduced Facial ROM: Within Functional Limits Facial Symmetry: Within Functional Limits Facial Strength: Reduced Facial Sensation: Within Functional Limits Velum:  (could not visualize) Mandible: Within Functional Limits   Ice Chips Ice chips: Within functional limits Presentation: Spoon   Thin Liquid Thin Liquid: Impaired Presentation: Cup;Spoon;Straw Oral Phase Impairments: Reduced labial seal Pharyngeal  Phase Impairments:  (eyes watering)    Nectar Thick Nectar Thick Liquid: Not tested   Honey Thick Honey Thick Liquid: Not tested   Puree Puree: Within functional limits Presentation: Spoon   Solid       Solid: Impaired Presentation: Spoon Oral Phase Impairments: Reduced lingual movement/coordination;Impaired anterior to posterior transit Oral Phase Functional Implications: Oral residue      Thank you,  Genene Churn, [place]  [facility] 08/02/2013,10:09 AM

## 2013-08-03 DIAGNOSIS — D649 Anemia, unspecified: Secondary | ICD-10-CM

## 2013-08-03 LAB — BASIC METABOLIC PANEL
BUN: 11 mg/dL (ref 6–23)
CO2: 30 mEq/L (ref 19–32)
Calcium: 8.6 mg/dL (ref 8.4–10.5)
Chloride: 101 mEq/L (ref 96–112)
Creatinine, Ser: 1.24 mg/dL (ref 0.50–1.35)
GFR calc non Af Amer: 54 mL/min — ABNORMAL LOW (ref >90)
GFR, EST AFRICAN AMERICAN: 62 mL/min — AB (ref >90)
Glucose, Bld: 118 mg/dL — ABNORMAL HIGH (ref 70–99)
POTASSIUM: 3.8 meq/L (ref 3.7–5.3)
SODIUM: 141 meq/L (ref 137–147)

## 2013-08-03 MED ORDER — METOPROLOL TARTRATE 1 MG/ML IV SOLN: 5.0000 mg | Freq: Four times a day (QID) | INTRAVENOUS | Status: DC | PRN

## 2013-08-03 MED ORDER — METOPROLOL TARTRATE 1 MG/ML IV SOLN
2.5000 mg | Freq: Four times a day (QID) | INTRAVENOUS | Status: DC | PRN
  Administered 2013-08-03: 2.5 mg via INTRAVENOUS
  Filled 2013-08-03: qty 5

## 2013-08-03 MED ORDER — HALOPERIDOL LACTATE 5 MG/ML IJ SOLN: 2.0000 mg | Freq: Once | INTRAMUSCULAR | Status: DC

## 2013-08-03 MED ORDER — HALOPERIDOL LACTATE 5 MG/ML IJ SOLN
INTRAMUSCULAR | Status: AC
  Administered 2013-08-03: 2 mg via INTRAVENOUS
  Filled 2013-08-03: qty 1

## 2013-08-03 MED ORDER — ENSURE COMPLETE PO LIQD
237.0000 mL | Freq: Two times a day (BID) | ORAL | Status: DC
  Administered 2013-08-04: 237 mL via ORAL

## 2013-08-03 MED ORDER — HYDRALAZINE HCL 20 MG/ML IJ SOLN: 10.0000 mg | Freq: Four times a day (QID) | INTRAMUSCULAR | Status: DC | PRN

## 2013-08-03 NOTE — Progress Notes (Signed)
[facility]                                                                              Patient Demographics  Douglas Mckay, is a 80 y.o. male, DOB - 1932-04-05, [id number]  Admit date - 07/24/2013   Admitting Physician Bonnielee Haff, MD  Outpatient Primary MD for the patient is [name],[name], MD  LOS - 10   Chief Complaint  Patient presents with  . Abdominal Pain        Assessment & Plan  Acute hypoxic respiratory failure.  -Stable, secondary to pneumonia.  Stridor.  -Resolved clinically and radiographically.  -Likely secondary to patient's self removal of NG tube or insertion. No likely offending medications.   Suspected aspiration pneumonia. -Minimal hypoxia, respiratory status stable.  -Continue empiric antibiotics.  Acute encephalopathy.  -Much improved today.  -Likely multifactorial in nature, ICU delirium, lack of sleep, acute illness, likely related to steroids.  -Will consult neurology for further management.  Small bowel obstruction.  -Resolved with conservative care.  UGIB, coffee-ground emesis.  -No recurrence.  -Hemoglobin is stable.  -GI plans upper endoscopy eventually but deferred until clinical improvement.  Acute renal failure with hypernatremia -Resolved.  -hypernatremia secondary to dehydration.   Normocytic anemia. -Stable  Hypertension.  -Somewhat labile. -will increase hydralazine to 10mg  q6 IV  Intention tremor upper extremities -Ongoing for  one month.  -CT head negative.  -CT soft tissue neck suggested advanced cervical spine degenerative changes with moderate to severe degenerative spinal stenosis. Suggest outpatient followup with neurosurgery.  -No focal neurologic deficits.  Code Status: Full  Family Communication: Wife at bedside.  Disposition Plan: Admitted.   Time Spent in minutes   45 minutes  Procedures None  Consults   Neurology General Surgery Gastroenterology Pulmnology  DVT Prophylaxis  SCDs  Lab Results  Component Value Date   PLT 185 08/01/2013    Medications  Scheduled Meds: . ampicillin-sulbactam (UNASYN) IV  1.5 g Intravenous Q8H  . famotidine (PEPCID) IV  20 mg Intravenous Q24H  . guaiFENesin  600 mg Oral 4 times per day  . haloperidol lactate  2 mg Intravenous Once  . magnesium hydroxide  30 mL Oral BID AC  . sodium chloride  3 mL Intravenous Q12H   Continuous Infusions: . dextrose 5 % with KCl 20 mEq / L 20 mEq (08/03/13 0600)   PRN Meds:.acetaminophen, acetaminophen, albuterol, hydrALAZINE, LORazepam, ondansetron (ZOFRAN) IV, ondansetron  Antibiotics    Anti-infectives   Start     Dose/Rate Route Frequency Ordered Stop   08/01/13 1200  ampicillin-sulbactam (UNASYN) 1.5 g in sodium chloride 0.9 % 50 mL IVPB     1.5 g 100 mL/hr over 30 Minutes Intravenous Every 8 hours 08/01/13 1121     07/31/13 1400  ampicillin-sulbactam (UNASYN) 1.5 g in sodium chloride 0.9 % 50 mL IVPB  Status:  Discontinued     1.5 g 100 mL/hr over 30 Minutes Intravenous Every 12 hours 07/31/13 1014 08/01/13 1121   07/29/13 1500  vancomycin (VANCOCIN) IVPB 750 mg/150 ml premix  Status:  Discontinued     750 mg 150 mL/hr over 60 Minutes Intravenous Every 24 hours 07/29/13 1356 07/30/13 1017  07/29/13 1400  piperacillin-tazobactam (ZOSYN) IVPB 3.375 g  Status:  Discontinued     3.375 g 12.5 mL/hr over 240 Minutes Intravenous Every 8 hours 07/29/13 1356 07/31/13 0853        Subjective:   Talmadge Chad seen and examined today.  Patient has no complaints today.  Unable to answer questions.    Objective:   Filed Vitals:   08/03/13 0400 08/03/13 0500 08/03/13 0600 08/03/13 0700  BP: 149/91 142/100 137/88 149/72  Pulse: 85     Temp:      TempSrc:      Resp: 18 14 18 12   Height:      Weight:      SpO2: 89%       Wt Readings from Last 3 Encounters:  08/01/13 52.2 kg (115 lb 1.3 oz)  02/19/13 58.514 kg (129 lb)     Intake/Output Summary (Last 24 hours) at 08/03/13  0831 Last data filed at 08/03/13 0600  Gross per 24 hour  Intake 1130.84 ml  Output      0 ml  Net 1130.84 ml    Exam  General: Well developed, well nourished, NAD, appears stated age  HEENT: NCAT, PERRLA, EOMI, Anicteic Sclera, mucous membranes moist. No pharyngeal erythema or exudates  Neck: Supple, no JVD, no masses  Cardiovascular: S1 S2 auscultated, no rubs, murmurs or gallops. Regular rate and rhythm.  Respiratory: Clear to auscultation bilaterally with equal chest rise  Abdomen: Soft, nontender, nondistended, + bowel sounds  Extremities: warm dry without cyanosis clubbing or edema  Neuro: Awake and alert, able to follow commands, cranial nerves grossly intact. No focal deficits.  Skin: Without rashes exudates or nodules   Data Review   Micro Results Recent Results (from the past 240 hour(s))  MRSA PCR SCREENING     Status: None   Collection Time    07/25/13  5:57 AM      Result Value Ref Range Status   MRSA by PCR NEGATIVE  NEGATIVE Final   Comment:            The GeneXpert MRSA Assay (FDA     approved for NASAL specimens     only), is one component of a     comprehensive MRSA colonization     surveillance program. It is not     intended to diagnose MRSA     infection nor to guide or     monitor treatment for     MRSA infections.    Radiology Reports Ct Head Wo Contrast  08/01/2013   CLINICAL DATA:  Encephalopathy. Evaluate for sinus disease. History of prostate cancer.  EXAM: CT HEAD WITHOUT CONTRAST  CT MAXILLOFACIAL WITHOUT CONTRAST  TECHNIQUE: Multidetector CT imaging of the head and maxillofacial structures were performed using the standard protocol without intravenous contrast. Multiplanar CT image reconstructions of the maxillofacial structures were also generated.  COMPARISON:  None.  FINDINGS: CT HEAD FINDINGS  Exam is motion degraded.  No intracranial hemorrhage.  No CT evidence of large acute infarct.  Mild atrophy. Ventricular prominence probably  related to atrophy rather than hydrocephalus.  No intracranial mass lesion noted on this unenhanced exam.  Vascular calcifications.  No bony destructive lesion/sclerosis.  CT MAXILLOFACIAL FINDINGS  Exam is motion degraded.  Minimal polypoid opacification left maxillary sinus. Remainder of paranasal sinuses are clear. Mastoid air cells and middle ear cavities are clear.  Mild haziness of fat planes upper neck as noted on recent neck CT.  Orbital structures unremarkable.  Cervical spondylotic changes incompletely assessed.  IMPRESSION: CT HEAD:  Exam is motion degraded.  No intracranial hemorrhage.  No CT evidence of large acute infarct.  Mild atrophy. Ventricular prominence probably related to atrophy rather than hydrocephalus.  Vascular calcifications.  CT MAXILLOFACIAL:  Exam is motion degraded.  Minimal polypoid opacification left maxillary sinus. Remainder of paranasal sinuses are clear. Mastoid air cells and middle ear cavities are clear.  Mild haziness of fat planes upper neck as noted on recent neck CT.   Electronically Signed   By: Chauncey Cruel M.D.   On: 08/01/2013 13:40   Ct Head Wo Contrast  07/25/2013   CLINICAL DATA:  Headache and tremors.  EXAM: CT HEAD WITHOUT CONTRAST  TECHNIQUE: Contiguous axial images were obtained from the base of the skull through the vertex without intravenous contrast.  COMPARISON:  02/19/2013  FINDINGS: Examination is limited by mild motion degradation and recent administration of intravenous contrast.  Skull and Sinuses:No significant abnormality.  Orbits: No acute abnormality.  Brain: No evidence of acute abnormality, such as acute infarction, hemorrhage, hydrocephalus, or mass lesion/mass effect. Unchanged symmetric thickening of the free edge of the tentorium.  IMPRESSION: Stable exam.  No acute intracranial abnormality.   Electronically Signed   By: Jorje Guild M.D.   On: 07/25/2013 04:17   Ct Soft Tissue Neck W Contrast  07/29/2013   CLINICAL DATA:  Recurrent  stridor.  Assess airway.  EXAM: CT NECK WITH CONTRAST  TECHNIQUE: Multidetector CT imaging of the neck was performed using the standard protocol following the bolus administration of intravenous contrast.  CONTRAST:  50mL OMNIPAQUE IOHEXOL 300 MG/ML  SOLN  COMPARISON:  CT NECK W/CM dated 07/27/2013; DG CHEST 1V PORT dated 07/29/2013  FINDINGS: The visualized portion of the brain is unremarkable. Orbits are unremarkable. Small mucous retention cyst is noted in the left maxillary sinus. Mastoid air cells are clear.  The nasopharynx, oral cavity, and oropharynx are unremarkable. Supraglottic laryngeal narrowing described on the prior study is no longer apparent. Thickening of the epiglottis and aryepiglottic folds has improved. Retropharyngeal soft tissue thickening/effusion has decreased. The right proximal right internal carotid artery is again noted to follow a retropharyngeal course. No enlarged cervical lymph nodes are identified. The parotid and submandibular glands are unremarkable.  The visualized trachea is widely patent. There is a mild distension of the upper thoracic esophagus with an air-fluid level. There are moderate bilateral pleural effusions, increased from prior. There are new, patchy ground-glass opacities in both upper lobes. There is reversal of the normal cervical lordosis. Moderate to severe spondylosis is present at C4-5, C5-6, and C6-7. Moderate cervical facet arthrosis is also present.  IMPRESSION: 1. Improved patency of the supraglottic airway with interval decrease/near complete resolution of supraglottic edema. 2. Increased size of moderate pleural effusions with new patchy bilateral upper lobe opacities, which may reflect edema. 3. Ffluid in the esophagus.   Electronically Signed   By: Logan Bores   On: 07/29/2013 16:19   Ct Soft Tissue Neck W Contrast  07/27/2013   CLINICAL DATA:  80 year old male with stridor beginning last night. Recent small bowel obstruction. Initial encounter.  EXAM:  CT NECK WITH CONTRAST  TECHNIQUE: Multidetector CT imaging of the neck was performed using the standard protocol following the bolus administration of intravenous contrast.  CONTRAST:  59mL OMNIPAQUE IOHEXOL 300 MG/ML  SOLN  COMPARISON:  Head CTs 07/25/2013 and earlier.  FINDINGS: Layering bilateral pleural effusions, small to moderate. Associated compressive atelectasis. Mild increased reticular  opacity in the dependent right lung (series 3, image 14) also favored to be atelectasis at this time.  Widely patent, and perhaps mildly bronchiectatic intra thoracic trachea. Larynx is remarkable for medial deviation of the left false cord, asymmetric appearance of the laryngeal ventricle. Area epiglottic folds appear mildly to moderately thickened, along with the epiglottis. There is associated airway narrowing at the level of the false cords. See series 2, image 66.  There is a small volume retropharyngeal effusion at the level of the larynx. Also noted is a retropharyngeal course of the right ICA (arrow on series 2, image 55).  Distended hypopharynx. Oropharynx and nasopharynx within normal limits. Negative parapharyngeal spaces. Negative sublingual space. Submandibular and parotid glands are within normal limits.  Calcified atherosclerosis at the skull base. Major vascular structures in the neck appear to remain patent, suboptimal intravascular contrast bolus timing. Bilateral carotid bifurcation calcified plaque.  No cervical lymphadenopathy identified.  It dental os. Visualized paranasal sinuses and mastoids are clear. Reversed cervical lordosis. Lower cervical chronic disc and endplate degeneration. Vacuum disc phenomena at C5-C6. Multifactorial moderate to severe degenerative spinal stenosis suspected at C5-C6 in part related to left paracentral disc protrusion (series 2, image 54). No acute osseous abnormality identified.  IMPRESSION: 1. Generalized supraglottic laryngeal edema appears to be responsible for  narrowing of the supraglottic airway (see series 2, image 66). Small retropharyngeal effusion. Node also a retropharyngeal course of the right carotid. 2. Layering bilateral pleural effusions with mild pulmonary atelectasis in the visualized upper lungs. 3. Advanced cervical spine degenerative changes, specially at C5-C6 where multifactorial moderate to severe degenerative spinal stenosis is suspected. Study discussed by telephone on 07/27/2013 at 12:59 with ICU Nurse Jolene Provost.   Electronically Signed   By: Lars Pinks M.D.   On: 07/27/2013 13:00   Ct Abdomen Pelvis W Contrast  07/25/2013   CLINICAL DATA:  New onset left lower abdominal pain. History of prostate cancer.  EXAM: CT ABDOMEN AND PELVIS WITH CONTRAST  TECHNIQUE: Multidetector CT imaging of the abdomen and pelvis was performed using the standard protocol following bolus administration of intravenous contrast.  CONTRAST:  39mL OMNIPAQUE IOHEXOL 300 MG/ML SOLN, 126mL OMNIPAQUE IOHEXOL 300 MG/ML SOLN  COMPARISON:  None available for comparison at time of study interpretation.  FINDINGS: Included view of the lung bases demonstrates mild dependent atelectasis. Included heart and pericardium are nonsuspicious.  Multiple loops of fluid distended small bowel measure up to 5.6 cm with transition point in a right lower quadrant associated with a surgical anastomotic line. No pneumatosis. Small amount of air and stool in the large bowel. Stomach is distended with debris and contrast.  Small amount of ascites without drainable fluid collections. No intraperitoneal free air. Mild mesenteric edema.  Subcentimeter hypodensities in the liver may reflect cysts, the liver is otherwise unremarkable. The spleen, adrenal glands, pancreas and gallbladder are nonsuspicious.  10 mm cyst at left interpolar kidney, too small to characterize hypodensities in the lower pole the kidneys bilaterally. No nephrolithiasis, hydronephrosis or renal masses. Great vessels are normal  in course and caliber with moderate calcific atherosclerosis. Status post prostatectomy. Urinary bladder is partially distended with mild circumferential wall thickening.  Moderate right inguinal hernia containing fat and fluid. Mild lumbar levoscoliosis and degenerative change.  IMPRESSION: High-grade small bowel obstruction with transition point in the right lower quadrant associated with surgical anastomotic suture, this may reflect adhesions. No bowel perforation.  Small amount of ascites without drainable fluid collections.   Electronically Signed  By: Elon Alas   On: 07/25/2013 02:18   Dg Chest Port 1 View  07/29/2013   CLINICAL DATA:  Stridor  EXAM: PORTABLE CHEST - 1 VIEW  COMPARISON:  Portable exam 1025 hr compared to 07/28/2013  FINDINGS: Upper normal heart size.  Atherosclerotic calcification of a mildly tortuous thoracic aorta.  Pulmonary vascular congestion.  Visualized trachea grossly normal caliber.  Bilateral pulmonary infiltrates in the mid to lower lungs question pulmonary edema versus pneumonia.  Small right pleural effusion.  No pneumothorax.  IMPRESSION: New pulmonary vascular congestion and bilateral pulmonary infiltrates with associated small right pleural effusion, could represent pulmonary edema or pneumonia. .   Electronically Signed   By: Lavonia Dana M.D.   On: 07/29/2013 09:05   Dg Chest Port 1 View  07/28/2013   CLINICAL DATA:  Atelectasis.  Small bowel obstruction.  EXAM: PORTABLE CHEST - 1 VIEW  COMPARISON:  07/27/2013  FINDINGS: Heart size is normal. There is asymmetric elevation of the right hemidiaphragm. No pleural effusion or edema identified. No airspace consolidation.  IMPRESSION: 1. Improved aeration of both lungs. 2. Persistent asymmetric elevation of right hemidiaphragm.   Electronically Signed   By: Kerby Moors M.D.   On: 07/28/2013 08:59   Dg Chest Port 1 View  07/27/2013   CLINICAL DATA:  Shortness of breath, stridor  EXAM: PORTABLE CHEST - 1 VIEW   COMPARISON:  None available  FINDINGS: Cardiac and mediastinal silhouettes are within normal limits.  Lungs are hypoinflated. There are patchy and linear bibasilar opacities, which may reflect atelectasis or possibly infiltrates. There is mild central perihilar vascular congestion without overt pulmonary edema. No pleural effusion. No pneumothorax.  No acute osseous abnormality.  Multiple prominent gas-filled loops of bowel are seen within the visualized upper abdomen, incompletely evaluated.  IMPRESSION: 1. Hypoinflation with bibasilar patchy and linear opacities. While these findings may reflect atelectasis, possible infiltrates could be considered in the correct clinical setting. 2. Multiple prominent gas-filled loops of bowel within the upper abdomen, incompletely evaluated. Further evaluation with dedicated abdominal radiograph could be performed if there is clinical concern for an underlying obstructive process.   Electronically Signed   By: Jeannine Boga M.D.   On: 07/27/2013 03:49   Dg Abd Portable 1v  07/28/2013   CLINICAL DATA:  Followup small bowel obstruction  EXAM: PORTABLE ABDOMEN - 1 VIEW  COMPARISON:  07/25/2013  FINDINGS: Scattered large and small bowel gas is noted. No definitive obstructive changes seen. The overall appearance is more of a and ileus then true small-bowel obstruction. Air is noted in the right inguinal region consistent with the known inguinal hernia. This is not appear to be incarcerated. No free air is seen. No acute bony abnormality is noted.  IMPRESSION: No definitive small bowel obstruction.  Gas within the right inguinal hernia consistent with bowel herniation.   Electronically Signed   By: Inez Catalina M.D.   On: 07/28/2013 08:57   Ct Maxillofacial Wo Cm  08/01/2013   CLINICAL DATA:  Encephalopathy. Evaluate for sinus disease. History of prostate cancer.  EXAM: CT HEAD WITHOUT CONTRAST  CT MAXILLOFACIAL WITHOUT CONTRAST  TECHNIQUE: Multidetector CT imaging of the  head and maxillofacial structures were performed using the standard protocol without intravenous contrast. Multiplanar CT image reconstructions of the maxillofacial structures were also generated.  COMPARISON:  None.  FINDINGS: CT HEAD FINDINGS  Exam is motion degraded.  No intracranial hemorrhage.  No CT evidence of large acute infarct.  Mild atrophy. Ventricular prominence  probably related to atrophy rather than hydrocephalus.  No intracranial mass lesion noted on this unenhanced exam.  Vascular calcifications.  No bony destructive lesion/sclerosis.  CT MAXILLOFACIAL FINDINGS  Exam is motion degraded.  Minimal polypoid opacification left maxillary sinus. Remainder of paranasal sinuses are clear. Mastoid air cells and middle ear cavities are clear.  Mild haziness of fat planes upper neck as noted on recent neck CT.  Orbital structures unremarkable.  Cervical spondylotic changes incompletely assessed.  IMPRESSION: CT HEAD:  Exam is motion degraded.  No intracranial hemorrhage.  No CT evidence of large acute infarct.  Mild atrophy. Ventricular prominence probably related to atrophy rather than hydrocephalus.  Vascular calcifications.  CT MAXILLOFACIAL:  Exam is motion degraded.  Minimal polypoid opacification left maxillary sinus. Remainder of paranasal sinuses are clear. Mastoid air cells and middle ear cavities are clear.  Mild haziness of fat planes upper neck as noted on recent neck CT.   Electronically Signed   By: Chauncey Cruel M.D.   On: 08/01/2013 13:40    CBC  Recent Labs Lab 07/27/13 0900 07/28/13 0549 07/29/13 0614 07/30/13 0800 08/01/13 0806  WBC 6.9 7.4 10.6* 6.9 7.0  HGB 12.0* 11.0* 11.1* 11.5* 10.7*  HCT 36.8* 33.1* 34.0* 34.9* 32.8*  PLT 176 161 241 178 185  MCV 82.7 81.7 82.5 81.9 83.0  MCH 27.0 27.2 26.9 27.0 27.1  MCHC 32.6 33.2 32.6 33.0 32.6  RDW 14.6 14.5 14.6 14.6 14.4    Chemistries   Recent Labs Lab 07/27/13 0900  07/30/13 0800 07/31/13 0754 08/01/13 0806  08/02/13 0511 08/03/13 0554  NA 140  < > 140 143 150* 142 141  K 3.9  < > 3.6* 3.4* 3.6* 3.2* 3.8  CL 100  < > 97 99 108 100 101  CO2 26  < > 30 31 30  33* 30  GLUCOSE 139*  < > 129* 158* 103* 122* 118*  BUN 15  < > 19 24* 19 9 11   CREATININE 1.19  < > 1.38* 1.52* 1.42* 1.15 1.24  CALCIUM 8.8  < > 8.8 9.2 8.7 8.3* 8.6  MG  --   --   --  2.6*  --   --   --   AST 28  --   --   --   --   --   --   ALT 16  --   --   --   --   --   --   ALKPHOS 67  --   --   --   --   --   --   BILITOT 0.4  --   --   --   --   --   --   < > = values in this interval not displayed. ------------------------------------------------------------------------------------------------------------------ estimated creatinine clearance is 35.7 ml/min (by C-G formula based on Cr of 1.24). ------------------------------------------------------------------------------------------------------------------ No results found for this basename: HGBA1C,  in the last 72 hours ------------------------------------------------------------------------------------------------------------------ No results found for this basename: CHOL, HDL, LDLCALC, TRIG, CHOLHDL, LDLDIRECT,  in the last 72 hours ------------------------------------------------------------------------------------------------------------------ No results found for this basename: TSH, T4TOTAL, FREET3, T3FREE, THYROIDAB,  in the last 72 hours ------------------------------------------------------------------------------------------------------------------ No results found for this basename: VITAMINB12, FOLATE, FERRITIN, TIBC, IRON, RETICCTPCT,  in the last 72 hours  Coagulation profile No results found for this basename: INR, PROTIME,  in the last 168 hours  No results found for this basename: DDIMER,  in the last 72 hours  Cardiac Enzymes No results found for this  basename: CK, CKMB, TROPONINI, MYOGLOBIN,  in the last 168  hours ------------------------------------------------------------------------------------------------------------------ No components found with this basename: POCBNP,     Jaydy Fitzhenry D.O. on 08/03/2013 at 8:31 AM  Between 7am to 7pm - Pager - [phone]  After 7pm go to [email or web address] - password [email or web address]  And look for the night coverage person covering for me after hours  [facility] Office  [phone]

## 2013-08-03 NOTE — Consult Note (Signed)
Douglas A. Merlene Laughter, MD     [email or web address]          Douglas Mckay is an 80 y.o. male.   ASSESSMENT/PLAN:  1. Likely multifactorial toxic metabolic encephalopathy. This should improve with time but may lag behind his underlying improvement in medical conditions. Dementia lab panel be obtained. Continue to follow patient's progression.  This is 80 year old black male who has been admitted and treated for multiple medical problems including pneumonia, dehydration and small bowel obstruction. The patient has had significant improvement in his medical condition but continues to have altered mentation. The mental status impairment has actually improved. The wife reports that there appears to be some fluctuations in his cognition. He is not based on however despite improvement in his medical condition. It appears more recently the patient had had issues with the stridor although this has resolved. Review of systems is limited given the impaired cognition.  GENERAL: This is a thin man in no acute distress.  HEENT: Neck is supple.  ABDOMEN: soft  EXTREMITIES: No edema   BACK: Unremarkable.  SKIN: Normal by inspection.    MENTAL STATUS: Patient is awake and alert. He does have marked dysarthria which seem to be coming from his stridor. He does follow commands with repeated prompting.   CRANIAL NERVES: Pupils are equal, round and reactive to light and accommodation; extra ocular movements are full, there is no significant nystagmus; visual fields are full; upper and lower facial muscles are normal in strength and symmetric, there is no flattening of the nasolabial folds; tongue is midline; uvula is midline; shoulder elevation is normal.  MOTOR: This is generally unremarkable with normal bulk and tone throughout. Exact strength is not obtainable given them. Cognition and difficulty following commands. He does have at least antigravity strength  throughout.  COORDINATION: Left finger to nose is normal, right finger to nose is normal, No rest tremor; no intention tremor; no postural tremor; no bradykinesia.  REFLEXES: Deep tendon reflexes are symmetrical and normal.   SENSATION: He responds to painful supply bilaterally.      Past Medical History  Diagnosis Date  . Hypertension   . Hypercholesterolemia   . Cancer     prostate    Past Surgical History  Procedure Laterality Date  . Prostatectomy      Family History  Problem Relation Age of Onset  . Stroke Mother     Social History:  reports that he has quit smoking. He does not have any smokeless tobacco history on file. He reports that he does not drink alcohol or use illicit drugs.  Allergies:  Allergies  Allergen Reactions  . Solu-Medrol [Methylprednisolone Acetate] Other (See Comments)    Severe encephalopathy/delirium.    Medications: Prior to Admission medications   Medication Sig Start Date End Date Taking? Authorizing Provider  amLODipine-benazepril (LOTREL) 5-40 MG per capsule Take 1 capsule by mouth daily.   Yes Historical Provider, MD  carvedilol (COREG) 3.125 MG tablet Take 3.125 mg by mouth daily.   Yes Historical Provider, MD  simvastatin (ZOCOR) 20 MG tablet Take 20 mg by mouth every evening.   Yes Historical Provider, MD    Scheduled Meds: . ampicillin-sulbactam (UNASYN) IV  1.5 g Intravenous Q8H  . famotidine (PEPCID) IV  20 mg Intravenous Q24H  . guaiFENesin  600 mg Oral 4 times per day  . haloperidol lactate  2 mg Intravenous Once  . magnesium hydroxide  30 mL Oral BID AC  . sodium chloride  3  mL Intravenous Q12H   Continuous Infusions: . dextrose 5 % with KCl 20 mEq / L 20 mEq (08/03/13 0600)   PRN Meds:.acetaminophen, acetaminophen, albuterol, hydrALAZINE, LORazepam, ondansetron (ZOFRAN) IV, ondansetron   Blood pressure 149/72, pulse 85, temperature 98.6 F (37 C), temperature source Oral, resp. rate 12, height $RemoveBe'5\' 1"'zYCwrhxAj$  (1.549 m),  weight 52.2 kg (115 lb 1.3 oz), SpO2 89.00%.   Results for orders placed during the hospital encounter of 07/24/13 (from the past 48 hour(s))  URINALYSIS, ROUTINE W REFLEX MICROSCOPIC     Status: Abnormal   Collection Time    08/01/13 10:42 AM      Result Value Ref Range   Color, Urine YELLOW  YELLOW   APPearance CLEAR  CLEAR   Specific Gravity, Urine 1.020  1.005 - 1.030   pH 8.0  5.0 - 8.0   Glucose, UA NEGATIVE  NEGATIVE mg/dL   Hgb urine dipstick TRACE (*) NEGATIVE   Bilirubin Urine NEGATIVE  NEGATIVE   Ketones, ur TRACE (*) NEGATIVE mg/dL   Protein, ur NEGATIVE  NEGATIVE mg/dL   Urobilinogen, UA 0.2  0.0 - 1.0 mg/dL   Nitrite NEGATIVE  NEGATIVE   Leukocytes, UA NEGATIVE  NEGATIVE  URINE MICROSCOPIC-ADD ON     Status: None   Collection Time    08/01/13 10:42 AM      Result Value Ref Range   RBC / HPF 0-2  <3 RBC/hpf  BASIC METABOLIC PANEL     Status: Abnormal   Collection Time    08/02/13  5:11 AM      Result Value Ref Range   Sodium 142  137 - 147 mEq/L   Comment: DELTA CHECK NOTED   Potassium 3.2 (*) 3.7 - 5.3 mEq/L   Chloride 100  96 - 112 mEq/L   CO2 33 (*) 19 - 32 mEq/L   Glucose, Bld 122 (*) 70 - 99 mg/dL   BUN 9  6 - 23 mg/dL   Creatinine, Ser 1.15  0.50 - 1.35 mg/dL   Calcium 8.3 (*) 8.4 - 10.5 mg/dL   GFR calc non Af Amer 59 (*) >90 mL/min   GFR calc Af Amer 68 (*) >90 mL/min   Comment: (NOTE)     The eGFR has been calculated using the CKD EPI equation.     This calculation has not been validated in all clinical situations.     eGFR's persistently <90 mL/min signify possible Chronic Kidney     Disease.  BASIC METABOLIC PANEL     Status: Abnormal   Collection Time    08/03/13  5:54 AM      Result Value Ref Range   Sodium 141  137 - 147 mEq/L   Potassium 3.8  3.7 - 5.3 mEq/L   Chloride 101  96 - 112 mEq/L   CO2 30  19 - 32 mEq/L   Glucose, Bld 118 (*) 70 - 99 mg/dL   BUN 11  6 - 23 mg/dL   Creatinine, Ser 1.24  0.50 - 1.35 mg/dL   Calcium 8.6  8.4 -  10.5 mg/dL   GFR calc non Af Amer 54 (*) >90 mL/min   GFR calc Af Amer 62 (*) >90 mL/min   Comment: (NOTE)     The eGFR has been calculated using the CKD EPI equation.     This calculation has not been validated in all clinical situations.     eGFR's persistently <90 mL/min signify possible Chronic Kidney     Disease.  Ct Head Wo Contrast  08/01/2013   CLINICAL DATA:  Encephalopathy. Evaluate for sinus disease. History of prostate cancer.  EXAM: CT HEAD WITHOUT CONTRAST  CT MAXILLOFACIAL WITHOUT CONTRAST  TECHNIQUE: Multidetector CT imaging of the head and maxillofacial structures were performed using the standard protocol without intravenous contrast. Multiplanar CT image reconstructions of the maxillofacial structures were also generated.  COMPARISON:  None.  FINDINGS: CT HEAD FINDINGS  Exam is motion degraded.  No intracranial hemorrhage.  No CT evidence of large acute infarct.  Mild atrophy. Ventricular prominence probably related to atrophy rather than hydrocephalus.  No intracranial mass lesion noted on this unenhanced exam.  Vascular calcifications.  No bony destructive lesion/sclerosis.  CT MAXILLOFACIAL FINDINGS  Exam is motion degraded.  Minimal polypoid opacification left maxillary sinus. Remainder of paranasal sinuses are clear. Mastoid air cells and middle ear cavities are clear.  Mild haziness of fat planes upper neck as noted on recent neck CT.  Orbital structures unremarkable.  Cervical spondylotic changes incompletely assessed.  IMPRESSION: CT HEAD:  Exam is motion degraded.  No intracranial hemorrhage.  No CT evidence of large acute infarct.  Mild atrophy. Ventricular prominence probably related to atrophy rather than hydrocephalus.  Vascular calcifications.  CT MAXILLOFACIAL:  Exam is motion degraded.  Minimal polypoid opacification left maxillary sinus. Remainder of paranasal sinuses are clear. Mastoid air cells and middle ear cavities are clear.  Mild haziness of fat planes upper  neck as noted on recent neck CT.   Electronically Signed   By: Chauncey Cruel M.D.   On: 08/01/2013 13:40   Ct Maxillofacial Wo Cm  08/01/2013   CLINICAL DATA:  Encephalopathy. Evaluate for sinus disease. History of prostate cancer.  EXAM: CT HEAD WITHOUT CONTRAST  CT MAXILLOFACIAL WITHOUT CONTRAST  TECHNIQUE: Multidetector CT imaging of the head and maxillofacial structures were performed using the standard protocol without intravenous contrast. Multiplanar CT image reconstructions of the maxillofacial structures were also generated.  COMPARISON:  None.  FINDINGS: CT HEAD FINDINGS  Exam is motion degraded.  No intracranial hemorrhage.  No CT evidence of large acute infarct.  Mild atrophy. Ventricular prominence probably related to atrophy rather than hydrocephalus.  No intracranial mass lesion noted on this unenhanced exam.  Vascular calcifications.  No bony destructive lesion/sclerosis.  CT MAXILLOFACIAL FINDINGS  Exam is motion degraded.  Minimal polypoid opacification left maxillary sinus. Remainder of paranasal sinuses are clear. Mastoid air cells and middle ear cavities are clear.  Mild haziness of fat planes upper neck as noted on recent neck CT.  Orbital structures unremarkable.  Cervical spondylotic changes incompletely assessed.  IMPRESSION: CT HEAD:  Exam is motion degraded.  No intracranial hemorrhage.  No CT evidence of large acute infarct.  Mild atrophy. Ventricular prominence probably related to atrophy rather than hydrocephalus.  Vascular calcifications.  CT MAXILLOFACIAL:  Exam is motion degraded.  Minimal polypoid opacification left maxillary sinus. Remainder of paranasal sinuses are clear. Mastoid air cells and middle ear cavities are clear.  Mild haziness of fat planes upper neck as noted on recent neck CT.   Electronically Signed   By: Chauncey Cruel M.D.   On: 08/01/2013 13:40        Brenn Deziel A. Merlene Mckay, M.D.  Diplomate, Tax adviser of Psychiatry and Neurology ( Neurology). 08/03/2013,  8:49 AM

## 2013-08-03 NOTE — Plan of Care (Signed)
Problem: Phase II Progression Outcomes Goal: Tolerating diet Outcome: Progressing Patient has po intake, but poor

## 2013-08-03 NOTE — Progress Notes (Signed)
NUTRITION FOLLOW UP  Intervention:   Ensure Complete po BID, each supplement provides 350 kcal and 13 grams of protein  Nutrition Dx:   Inadequate oral intake related to decreased appetite as evidenced by PO: 0%.; ongoing  Goal:   Pt will meet >90% of estimated nutritional needs; goal not met  Monitor:   PO intake, labs, weight changes, akin assessment, I/O's, changes in status  Assessment:   Pt respiratory status is stable without recurrent stridor; he has completed steroids.  SLP evaluated and pt was found to have reduced lingual movement. He has been advanced to dysphagia 1 diet with thin liquids and requires 100% feeding assistance. Pt does not have an appetite and declined lunch yesterday. PO: 0%.  Pt has lost 10# (8%) x 5 days, which may be due to fluid loss.   Height: Ht Readings from Last 1 Encounters:  07/25/13 _0  (1.549 m)    Weight Status:   Wt Readings from Last 1 Encounters:  08/01/13 115 lb 1.3 oz (52.2 kg)    Re-estimated needs:  Kcal: 2202-6691 daily Protein: 52-65 grams daily Fluid: 1.1-1.3 L daily  Skin: Intact; rt shin skin tear  Diet Order: Dysphagia   Intake/Output Summary (Last 24 hours) at 08/03/13 1151 Last data filed at 08/03/13 0945  Gross per 24 hour  Intake 1090.17 ml  Output      0 ml  Net 1090.17 ml    Last BM: 07/28/13   Labs:   Recent Labs Lab 07/30/13 0800 07/31/13 0754 08/01/13 0806 08/02/13 0511 08/03/13 0554  NA 140 143 150* 142 141  K 3.6* 3.4* 3.6* 3.2* 3.8  CL 97 99 108 100 101  CO2 _1 33* 30  BUN 19 24* _2 CREATININE 1.38* 1.52* 1.42* 1.15 1.24  CALCIUM 8.8 9.2 8.7 8.3* 8.6  MG  --  2.6*  --   --   --   PHOS  --  3.6  --   --   --   GLUCOSE 129* 158* 103* 122* 118*    CBG (last 3)  No results found for this basename: GLUCAP,  in the last 72 hours  Scheduled Meds: . ampicillin-sulbactam (UNASYN) IV  1.5 g Intravenous Q8H  . famotidine (PEPCID) IV  20 mg Intravenous Q24H  . guaiFENesin   600 mg Oral 4 times per day  . haloperidol lactate  2 mg Intravenous Once  . magnesium hydroxide  30 mL Oral BID AC  . sodium chloride  3 mL Intravenous Q12H    Continuous Infusions: . dextrose 5 % with KCl 20 mEq / L 20 mEq (08/03/13 0600)    Malacai Grantz A. Jimmye Norman, RD, LDN Pager: [phone]

## 2013-08-03 NOTE — Progress Notes (Signed)
Pt still restless and trying to get out of restraints, not redirectable, no c/o pain, low bed , hob at 30 degrees, call bell at bedside and wife at bed side, will cont to monitor

## 2013-08-03 NOTE — Progress Notes (Signed)
Speech Language Pathology Treatment:    Patient Details Name: Douglas Mckay MRN: [id number] DOB: [birth year] Today's Date: 08/03/2013   SLP followed up with pt who seems to be tolerating diet well. Nursing reports that he had no difficulty with breakfast; however, poor appetite is noted with pt refusing PO intake at times.   GO     Douglas Mckay S 08/03/2013, 12:47 PM

## 2013-08-03 NOTE — Progress Notes (Signed)
Physical Therapy Treatment Patient Details Name: Douglas Mckay MRN: [id number] DOB: September 07, 1931 Today's Date: 08/03/2013 Time: 4818-5631 PT Time Calculation (min): 60 min  PT Assessment / Plan / Recommendation  History of Present Illness     PT Comments   Pt very confused, Unable to proper sequence motions needed for ambulation with RW.  Pt responds to commands 1/20x with multimodal cueing required.. Highly recommend SNF for safety.    Follow Up Recommendations        Does the patient have the potential to tolerate intense rehabilitation     Barriers to Discharge        Equipment Recommendations       Recommendations for Other Services    Frequency     Progress towards PT Goals Progress towards PT goals: Progressing toward goals  Plan      Precautions / Restrictions Precautions Precautions: Fall    Mobility  Bed Mobility Overal bed mobility: Needs Assistance Bed Mobility: Supine to Sit Supine to sit: Supervision General bed mobility comments: Max cueing of tasks Transfers Overall transfer level: Needs assistance Equipment used: Rolling walker (2 wheeled) Transfers: Sit to/from Stand Sit to Stand: Min guard General transfer comment: max cueing for tasks and handplacement Ambulation/Gait Ambulation/Gait assistance: Min assist Ambulation Distance (Feet): 40 Feet (2 sets 30feet) Assistive device: Rolling walker (2 wheeled) Gait Pattern/deviations: Decreased stride length;Trunk flexed;Narrow base of support    Exercises     PT Diagnosis:    PT Problem List:   PT Treatment Interventions:     PT Goals (current goals can now be found in the care plan section)    Visit Information  Last PT Received On: 08/03/13    Subjective Data      Cognition  Cognition Arousal/Alertness: Awake/alert Behavior During Therapy: [facility] for tasks assessed/performed;Agitated Overall Cognitive Status: Impaired/Different from baseline Area of Impairment: Following  commands;Safety/judgement;Awareness Memory: Decreased recall of precautions Following Commands: Follows one step commands inconsistently Safety/Judgement: Decreased awareness of safety;Decreased awareness of deficits    Balance     End of Session PT - End of Session Equipment Utilized During Treatment: Gait belt Activity Tolerance: Patient limited by fatigue;Patient tolerated treatment well Patient left: in bed;with call bell/phone within reach;with family/visitor present;with bed alarm set Nurse Communication: Mobility status   GP     Aldona Lento 08/03/2013, 12:19 PM

## 2013-08-04 LAB — TSH: TSH: 1.259 u[IU]/mL (ref 0.350–4.500)

## 2013-08-04 LAB — VITAMIN B12: VITAMIN B 12: 272 pg/mL (ref 211–911)

## 2013-08-04 LAB — RPR: RPR: NONREACTIVE

## 2013-08-04 LAB — HOMOCYSTEINE: Homocysteine: 14.5 umol/L (ref 4.0–15.4)

## 2013-08-04 MED ORDER — CYANOCOBALAMIN 1000 MCG/ML IJ SOLN
1000.0000 ug | Freq: Once | INTRAMUSCULAR | Status: AC
  Administered 2013-08-04: 1000 ug via INTRAMUSCULAR
  Filled 2013-08-04: qty 1

## 2013-08-04 MED ORDER — MAGNESIUM HYDROXIDE 400 MG/5ML PO SUSP: 30.0000 mL | Freq: Two times a day (BID) | ORAL | Status: DC

## 2013-08-04 MED ORDER — LEVOFLOXACIN 750 MG PO TABS: 750.0000 mg | ORAL_TABLET | ORAL | Status: AC

## 2013-08-04 MED ORDER — ENSURE COMPLETE PO LIQD: 237.0000 mL | Freq: Two times a day (BID) | ORAL | Status: DC

## 2013-08-04 MED ORDER — GUAIFENESIN 100 MG/5ML PO SOLN: 600.0000 mg | Freq: Four times a day (QID) | ORAL | Status: DC

## 2013-08-04 MED ORDER — ONDANSETRON HCL 4 MG PO TABS: 4.0000 mg | ORAL_TABLET | Freq: Four times a day (QID) | ORAL | Status: DC | PRN

## 2013-08-04 MED ORDER — ACETAMINOPHEN 325 MG PO TABS: 650.0000 mg | ORAL_TABLET | Freq: Four times a day (QID) | ORAL | Status: AC | PRN

## 2013-08-04 NOTE — Clinical Social Work Psychosocial (Signed)
Clinical Social Work Department BRIEF PSYCHOSOCIAL ASSESSMENT 08/04/2013  Patient:  Douglas Mckay, Douglas Mckay     Account Number:  [id number]     Admit date:  07/24/2013  Clinical Social Worker:  Wyatt Haste  Date/Time:  08/04/2013 11:00 AM  Referred by:  Physician  Date Referred:  08/04/2013 Referred for  SNF Placement   Other Referral:   Interview type:  Patient Other interview type:   wife    PSYCHOSOCIAL DATA Living Status:  WIFE Admitted from facility:   Level of care:   Primary support name:  Elois Primary support relationship to patient:  SPOUSE Degree of support available:   very supportive    CURRENT CONCERNS Current Concerns  Post-Acute Placement   Other Concerns:    SOCIAL WORK ASSESSMENT / PLAN CSW met with pt and pt's wife at bedside following MD referral for possible SNF. Pt alert and oriented to self and place. Lives with his wife who has stayed with pt in hospital during lengthy stay. Pt was admitted on 2/2 with small bowel obstruction. He has had periods of confusion, which appears to have improved. Pt was evaluated by PT several days ago and plan was for return home. Wife states they have family members that work in home health and felt they would be willing to assist. After discussion this morning, pt and wife agreed to see how pt did with PT today and make decision. At baseline, pt is independent with all ADLs. Today, pt ambulated with mod assist and use of rolling walker. Felt to be appropriate for SNF and pt and wife agree. They live in [place] and request placement there. CSW discussed placement process, including Medicare coverage/criteria. Pt is stable for d/c today. SNF list provided.   Assessment/plan status:  Psychosocial Support/Ongoing Assessment of Needs Other assessment/ plan:   Information/referral to community resources:   SNF list    PATIENT'S/FAMILY'S RESPONSE TO PLAN OF CARE: Pt and wife are now agreeable to [facility] SNF for rehab. CSW will  initiate bed search and follow up with offers when available.       Benay Pike, [place]

## 2013-08-04 NOTE — Clinical Social Work Placement (Signed)
Clinical Social Work Department CLINICAL SOCIAL WORK PLACEMENT NOTE 08/04/2013  Patient:  GEOFF, DACANAY  Account Number:  [id number] Admit date:  07/24/2013  Clinical Social Worker:  Benay Pike, LCSW  Date/time:  08/04/2013 02:28 PM  Clinical Social Work is seeking post-discharge placement for this patient at the following level of care:   [place]   (*CSW will update this form in Epic as items are completed)   08/04/2013  Patient/family provided with [place] Department of Clinical Social Work's list of facilities offering this level of care within the geographic area requested by the patient (or if unable, by the patient's family).  08/04/2013  Patient/family informed of their freedom to choose among providers that offer the needed level of care, that participate in Medicare, Medicaid or managed care program needed by the patient, have an available bed and are willing to accept the patient.  08/04/2013  Patient/family informed of [facility]' ownership interest in [facility], as well as of the fact that they are under no obligation to receive care at this facility.  PASARR submitted to EDS on  PASARR number received from [place] on   FL2 transmitted to all facilities in geographic area requested by pt/family on  08/04/2013 FL2 transmitted to all facilities within larger geographic area on   Patient informed that his/her managed care company has contracts with or will negotiate with  certain facilities, including the following:     Patient/family informed of bed offers received:  08/04/2013 Patient chooses bed at OTHER Physician recommends and patient chooses bed at  OTHER  Patient to be transferred to [facility] on  08/04/2013 Patient to be transferred to facility by family  The following physician request were entered in Epic:   Additional Comments:   Benay Pike, [place]

## 2013-08-04 NOTE — Clinical Social Work Note (Signed)
CSW presented bed offer at [facility] which was requested by wife. Pt and family accept. No pasarr needed per [facility]. D/C summary and FL2 faxed. Discussed transportation with wife and brother. Pt's brother felt pt should qualify for EMS transport. CSW explained that pt had been ambulating earlier today and would not qualify according to Medicare criteria. CSW called [facility] to see if they had transport Lucianne Lei available and they do not. Offered private pay wheelchair [employer] or EMS. Family decided to take pt themselves. Aware that staff here and at SNF would assist pt in and out of the car. Facility agreeable. RN and PT feel pt would be easily capable of transport in wheelchair.   Douglas Mckay, [place]

## 2013-08-04 NOTE — Progress Notes (Signed)
Family given discharge packet to give to facility. [place] [facility] called. Report called to [facility]. Informed her that family will arrive with patient. Patient taken out of facility via wheelchair in stable condition.

## 2013-08-04 NOTE — Discharge Summary (Signed)
Physician Discharge Summary  Douglas Mckay [id number] DOB: [birth year] DOA: 07/24/2013  PCP: Marjo Bicker, MD  Admit date: 07/24/2013 Discharge date: 08/04/2013  Time spent: 35 minutes  Recommendations for Outpatient Follow-up:  Patient will be discharged to nursing home facility in [place], [place]. He is to continue taking his medications as prescribed. Patient should continue physical therapy as well as occupational therapy as recommended by the nursing home facility. He should follow with his primary care physician within one week of discharge as well as a physician at the nursing home facility.   Discharge Diagnoses:  Acute hypoxic respiratory failure, stable Stridor, resolved Suspected aspiration pneumonia, improving Acute encephalopathy, improving Small bowel obstruction, resolved Upper GI bleeding with coffee ground emesis, stable Acute renal failure with hypernatremia, resolved Normocytic anemia, stable Hypertension, stable Intention tremor in the upper extremities, stable  Discharge Condition: Stable  Diet recommendation: Heart healthy, dysphagia 1 diet  Filed Weights   07/28/13 0500 08/01/13 0500 08/04/13 0500  Weight: 56.8 kg (125 lb 3.5 oz) 52.2 kg (115 lb 1.3 oz) 49.9 kg (110 lb 0.2 oz)    History of present illness:  Douglas Mckay is a 80 y.o. male with a past medical history of hypertension, hypercholesterolemia, prostate cancer, status post prostatectomy in the past who presents to the hospital with complaints of abdominal pain, ongoing for one week. Unfortunately, patient received multiple doses of fentanyl and as a result is very drowsy. His wife was at the, bedside and she provided some history but unfortunately, it was very limited. It appears that he's been having symptoms of abdominal pain for a week. But there has been no complaints of nausea or vomiting. He, apparently, has been eating and drinking. However, he hasn't had a bowel movement since Friday.  Patient is noted to be quite tremulous especially with movement and his wife tells me that this has been ongoing for a month. She was unable to provide any more information. Apparently patient has also been having headaches, along with generalized weakness.   Hospital Course:  Acute hypoxic respiratory failure.  -Stable, secondary to pneumonia.   Stridor.  -Resolved clinically and radiographically.  -Likely secondary to patient's self removal of NG tube or insertion. No likely offending medications.   Suspected aspiration pneumonia.  -Minimal hypoxia, respiratory status stable.  -He was placed on the Unasyn and by mouth clean. Will continue patient on Levaquin 750 mg by mouth every other day for the next 10 days.  Acute encephalopathy.  -Much improved today.  -Likely multifactorial in nature, ICU delirium, lack of sleep, acute illness, likely related to steroids.  -Neurology consulted and felt this also be multifactorial and should improve with time  Small bowel obstruction.  -Resolved with conservative care.   UGIB, coffee-ground emesis.  -No recurrence.  -Hemoglobin is stable.  -GI plans upper endoscopy eventually but deferred as outpatient  Acute renal failure with hypernatremia  -Resolved.  -hypernatremia secondary to dehydration.   Normocytic anemia, stable  Hypertension.  -Improving and stable. -Will restart patient's home medications of amlodipine and Lotrel.  Intention tremor upper extremities  -Ongoing for one month.  -CT head negative.  -CT soft tissue neck suggested advanced cervical spine degenerative changes with moderate to severe degenerative spinal stenosis. Suggest outpatient followup with neurosurgery.  -No focal neurologic deficits.  Procedures: None  Consultations: Neurology  General Surgery  Gastroenterology  Pulmnology   Discharge Exam: Filed Vitals:   08/04/13 0900  BP: 143/95  Temp:   Resp: 14   Exam  General: Well developed, well  nourished, NAD, appears stated age  HEENT: NCAT, PERRLA, EOMI, Anicteic Sclera, mucous membranes moist.  Neck: Supple, no JVD, no masses  Cardiovascular: S1 S2 auscultated, no rubs, murmurs or gallops. Regular rate and rhythm.  Respiratory: Clear to auscultation bilaterally with equal chest rise  Abdomen: Soft, nontender, nondistended, + bowel sounds  Extremities: warm dry without cyanosis clubbing or edema  Neuro: Awake and alert, able to follow commands. No focal deficits.  Skin: Without rashes exudates or nodules  Discharge Instructions  Discharge Orders   Future Orders Complete By Expires   Diet - low sodium heart healthy  As directed    Discharge instructions  As directed    Comments:     Patient will be discharged to nursing home facility in [place], [place].  He is to continue taking his medications as prescribed. Patient should continue physical therapy as well as occupational therapy as recommended by the nursing home facility. He should follow with his primary care physician within one week of discharge as well as a physician at the nursing home facility.   Increase activity slowly  As directed        Medication List         acetaminophen 325 MG tablet  Commonly known as:  TYLENOL  Take 2 tablets (650 mg total) by mouth every 6 (six) hours as needed for mild pain (or Fever >/= 101).     amLODipine-benazepril 5-40 MG per capsule  Commonly known as:  LOTREL  Take 1 capsule by mouth daily.     carvedilol 3.125 MG tablet  Commonly known as:  COREG  Take 3.125 mg by mouth daily.     feeding supplement (ENSURE COMPLETE) Liqd  Take 237 mLs by mouth 2 (two) times daily between meals.     guaiFENesin 100 MG/5ML Soln  Commonly known as:  ROBITUSSIN  Take 30 mLs (600 mg total) by mouth every 6 (six) hours.     levofloxacin 750 MG tablet  Commonly known as:  LEVAQUIN  Take 1 tablet (750 mg total) by mouth every other day.     magnesium hydroxide 400 MG/5ML suspension    Commonly known as:  MILK OF MAGNESIA  Take 30 mLs by mouth 2 (two) times daily before a meal.     ondansetron 4 MG tablet  Commonly known as:  ZOFRAN  Take 1 tablet (4 mg total) by mouth every 6 (six) hours as needed for nausea.     simvastatin 20 MG tablet  Commonly known as:  ZOCOR  Take 20 mg by mouth every evening.       Allergies  Allergen Reactions  . Solu-Medrol [Methylprednisolone Acetate] Other (See Comments)    Severe encephalopathy/delirium.       Follow-up Information   Follow up with [name],[name], MD. Schedule an appointment as soon as possible for a visit in 1 week.   Specialty:  Internal Medicine      Follow up with Physician at the nursing home In 1 day.       The results of significant diagnostics from this hospitalization (including imaging, microbiology, ancillary and laboratory) are listed below for reference.    Significant Diagnostic Studies: Ct Head Wo Contrast  08/01/2013   CLINICAL DATA:  Encephalopathy. Evaluate for sinus disease. History of prostate cancer.  EXAM: CT HEAD WITHOUT CONTRAST  CT MAXILLOFACIAL WITHOUT CONTRAST  TECHNIQUE: Multidetector CT imaging of the head and maxillofacial structures were performed using the standard protocol  without intravenous contrast. Multiplanar CT image reconstructions of the maxillofacial structures were also generated.  COMPARISON:  None.  FINDINGS: CT HEAD FINDINGS  Exam is motion degraded.  No intracranial hemorrhage.  No CT evidence of large acute infarct.  Mild atrophy. Ventricular prominence probably related to atrophy rather than hydrocephalus.  No intracranial mass lesion noted on this unenhanced exam.  Vascular calcifications.  No bony destructive lesion/sclerosis.  CT MAXILLOFACIAL FINDINGS  Exam is motion degraded.  Minimal polypoid opacification left maxillary sinus. Remainder of paranasal sinuses are clear. Mastoid air cells and middle ear cavities are clear.  Mild haziness of fat planes upper neck  as noted on recent neck CT.  Orbital structures unremarkable.  Cervical spondylotic changes incompletely assessed.  IMPRESSION: CT HEAD:  Exam is motion degraded.  No intracranial hemorrhage.  No CT evidence of large acute infarct.  Mild atrophy. Ventricular prominence probably related to atrophy rather than hydrocephalus.  Vascular calcifications.  CT MAXILLOFACIAL:  Exam is motion degraded.  Minimal polypoid opacification left maxillary sinus. Remainder of paranasal sinuses are clear. Mastoid air cells and middle ear cavities are clear.  Mild haziness of fat planes upper neck as noted on recent neck CT.   Electronically Signed   By: Chauncey Cruel M.D.   On: 08/01/2013 13:40   Ct Head Wo Contrast  07/25/2013   CLINICAL DATA:  Headache and tremors.  EXAM: CT HEAD WITHOUT CONTRAST  TECHNIQUE: Contiguous axial images were obtained from the base of the skull through the vertex without intravenous contrast.  COMPARISON:  02/19/2013  FINDINGS: Examination is limited by mild motion degradation and recent administration of intravenous contrast.  Skull and Sinuses:No significant abnormality.  Orbits: No acute abnormality.  Brain: No evidence of acute abnormality, such as acute infarction, hemorrhage, hydrocephalus, or mass lesion/mass effect. Unchanged symmetric thickening of the free edge of the tentorium.  IMPRESSION: Stable exam.  No acute intracranial abnormality.   Electronically Signed   By: Jorje Guild M.D.   On: 07/25/2013 04:17   Ct Soft Tissue Neck W Contrast  07/29/2013   CLINICAL DATA:  Recurrent stridor.  Assess airway.  EXAM: CT NECK WITH CONTRAST  TECHNIQUE: Multidetector CT imaging of the neck was performed using the standard protocol following the bolus administration of intravenous contrast.  CONTRAST:  44mL OMNIPAQUE IOHEXOL 300 MG/ML  SOLN  COMPARISON:  CT NECK W/CM dated 07/27/2013; DG CHEST 1V PORT dated 07/29/2013  FINDINGS: The visualized portion of the brain is unremarkable. Orbits are  unremarkable. Small mucous retention cyst is noted in the left maxillary sinus. Mastoid air cells are clear.  The nasopharynx, oral cavity, and oropharynx are unremarkable. Supraglottic laryngeal narrowing described on the prior study is no longer apparent. Thickening of the epiglottis and aryepiglottic folds has improved. Retropharyngeal soft tissue thickening/effusion has decreased. The right proximal right internal carotid artery is again noted to follow a retropharyngeal course. No enlarged cervical lymph nodes are identified. The parotid and submandibular glands are unremarkable.  The visualized trachea is widely patent. There is a mild distension of the upper thoracic esophagus with an air-fluid level. There are moderate bilateral pleural effusions, increased from prior. There are new, patchy ground-glass opacities in both upper lobes. There is reversal of the normal cervical lordosis. Moderate to severe spondylosis is present at C4-5, C5-6, and C6-7. Moderate cervical facet arthrosis is also present.  IMPRESSION: 1. Improved patency of the supraglottic airway with interval decrease/near complete resolution of supraglottic edema. 2. Increased size of moderate pleural  effusions with new patchy bilateral upper lobe opacities, which may reflect edema. 3. Ffluid in the esophagus.   Electronically Signed   By: Logan Bores   On: 07/29/2013 16:19   Ct Soft Tissue Neck W Contrast  07/27/2013   CLINICAL DATA:  80 year old male with stridor beginning last night. Recent small bowel obstruction. Initial encounter.  EXAM: CT NECK WITH CONTRAST  TECHNIQUE: Multidetector CT imaging of the neck was performed using the standard protocol following the bolus administration of intravenous contrast.  CONTRAST:  50mL OMNIPAQUE IOHEXOL 300 MG/ML  SOLN  COMPARISON:  Head CTs 07/25/2013 and earlier.  FINDINGS: Layering bilateral pleural effusions, small to moderate. Associated compressive atelectasis. Mild increased reticular  opacity in the dependent right lung (series 3, image 14) also favored to be atelectasis at this time.  Widely patent, and perhaps mildly bronchiectatic intra thoracic trachea. Larynx is remarkable for medial deviation of the left false cord, asymmetric appearance of the laryngeal ventricle. Area epiglottic folds appear mildly to moderately thickened, along with the epiglottis. There is associated airway narrowing at the level of the false cords. See series 2, image 66.  There is a small volume retropharyngeal effusion at the level of the larynx. Also noted is a retropharyngeal course of the right ICA (arrow on series 2, image 55).  Distended hypopharynx. Oropharynx and nasopharynx within normal limits. Negative parapharyngeal spaces. Negative sublingual space. Submandibular and parotid glands are within normal limits.  Calcified atherosclerosis at the skull base. Major vascular structures in the neck appear to remain patent, suboptimal intravascular contrast bolus timing. Bilateral carotid bifurcation calcified plaque.  No cervical lymphadenopathy identified.  It dental os. Visualized paranasal sinuses and mastoids are clear. Reversed cervical lordosis. Lower cervical chronic disc and endplate degeneration. Vacuum disc phenomena at C5-C6. Multifactorial moderate to severe degenerative spinal stenosis suspected at C5-C6 in part related to left paracentral disc protrusion (series 2, image 54). No acute osseous abnormality identified.  IMPRESSION: 1. Generalized supraglottic laryngeal edema appears to be responsible for narrowing of the supraglottic airway (see series 2, image 66). Small retropharyngeal effusion. Node also a retropharyngeal course of the right carotid. 2. Layering bilateral pleural effusions with mild pulmonary atelectasis in the visualized upper lungs. 3. Advanced cervical spine degenerative changes, specially at C5-C6 where multifactorial moderate to severe degenerative spinal stenosis is suspected.  Study discussed by telephone on 07/27/2013 at 12:59 with ICU Nurse Jolene Provost.   Electronically Signed   By: Lars Pinks M.D.   On: 07/27/2013 13:00   Ct Abdomen Pelvis W Contrast  07/25/2013   CLINICAL DATA:  New onset left lower abdominal pain. History of prostate cancer.  EXAM: CT ABDOMEN AND PELVIS WITH CONTRAST  TECHNIQUE: Multidetector CT imaging of the abdomen and pelvis was performed using the standard protocol following bolus administration of intravenous contrast.  CONTRAST:  36mL OMNIPAQUE IOHEXOL 300 MG/ML SOLN, 189mL OMNIPAQUE IOHEXOL 300 MG/ML SOLN  COMPARISON:  None available for comparison at time of study interpretation.  FINDINGS: Included view of the lung bases demonstrates mild dependent atelectasis. Included heart and pericardium are nonsuspicious.  Multiple loops of fluid distended small bowel measure up to 5.6 cm with transition point in a right lower quadrant associated with a surgical anastomotic line. No pneumatosis. Small amount of air and stool in the large bowel. Stomach is distended with debris and contrast.  Small amount of ascites without drainable fluid collections. No intraperitoneal free air. Mild mesenteric edema.  Subcentimeter hypodensities in the liver may reflect cysts,  the liver is otherwise unremarkable. The spleen, adrenal glands, pancreas and gallbladder are nonsuspicious.  10 mm cyst at left interpolar kidney, too small to characterize hypodensities in the lower pole the kidneys bilaterally. No nephrolithiasis, hydronephrosis or renal masses. Great vessels are normal in course and caliber with moderate calcific atherosclerosis. Status post prostatectomy. Urinary bladder is partially distended with mild circumferential wall thickening.  Moderate right inguinal hernia containing fat and fluid. Mild lumbar levoscoliosis and degenerative change.  IMPRESSION: High-grade small bowel obstruction with transition point in the right lower quadrant associated with surgical  anastomotic suture, this may reflect adhesions. No bowel perforation.  Small amount of ascites without drainable fluid collections.   Electronically Signed   By: Elon Alas   On: 07/25/2013 02:18   Dg Chest Port 1 View  07/29/2013   CLINICAL DATA:  Stridor  EXAM: PORTABLE CHEST - 1 VIEW  COMPARISON:  Portable exam [id number] hr compared to 07/28/2013  FINDINGS: Upper normal heart size.  Atherosclerotic calcification of a mildly tortuous thoracic aorta.  Pulmonary vascular congestion.  Visualized trachea grossly normal caliber.  Bilateral pulmonary infiltrates in the mid to lower lungs question pulmonary edema versus pneumonia.  Small right pleural effusion.  No pneumothorax.  IMPRESSION: New pulmonary vascular congestion and bilateral pulmonary infiltrates with associated small right pleural effusion, could represent pulmonary edema or pneumonia. .   Electronically Signed   By: Lavonia Dana M.D.   On: 07/29/2013 09:05   Dg Chest Port 1 View  07/28/2013   CLINICAL DATA:  Atelectasis.  Small bowel obstruction.  EXAM: PORTABLE CHEST - 1 VIEW  COMPARISON:  07/27/2013  FINDINGS: Heart size is normal. There is asymmetric elevation of the right hemidiaphragm. No pleural effusion or edema identified. No airspace consolidation.  IMPRESSION: 1. Improved aeration of both lungs. 2. Persistent asymmetric elevation of right hemidiaphragm.   Electronically Signed   By: Kerby Moors M.D.   On: 07/28/2013 08:59   Dg Chest Port 1 View  07/27/2013   CLINICAL DATA:  Shortness of breath, stridor  EXAM: PORTABLE CHEST - 1 VIEW  COMPARISON:  None available  FINDINGS: Cardiac and mediastinal silhouettes are within normal limits.  Lungs are hypoinflated. There are patchy and linear bibasilar opacities, which may reflect atelectasis or possibly infiltrates. There is mild central perihilar vascular congestion without overt pulmonary edema. No pleural effusion. No pneumothorax.  No acute osseous abnormality.  Multiple prominent  gas-filled loops of bowel are seen within the visualized upper abdomen, incompletely evaluated.  IMPRESSION: 1. Hypoinflation with bibasilar patchy and linear opacities. While these findings may reflect atelectasis, possible infiltrates could be considered in the correct clinical setting. 2. Multiple prominent gas-filled loops of bowel within the upper abdomen, incompletely evaluated. Further evaluation with dedicated abdominal radiograph could be performed if there is clinical concern for an underlying obstructive process.   Electronically Signed   By: Jeannine Boga M.D.   On: 07/27/2013 03:49   Dg Abd Portable 1v  07/28/2013   CLINICAL DATA:  Followup small bowel obstruction  EXAM: PORTABLE ABDOMEN - 1 VIEW  COMPARISON:  07/25/2013  FINDINGS: Scattered large and small bowel gas is noted. No definitive obstructive changes seen. The overall appearance is more of a and ileus then true small-bowel obstruction. Air is noted in the right inguinal region consistent with the known inguinal hernia. This is not appear to be incarcerated. No free air is seen. No acute bony abnormality is noted.  IMPRESSION: No definitive small bowel obstruction.  Gas within the right inguinal hernia consistent with bowel herniation.   Electronically Signed   By: Inez Catalina M.D.   On: 07/28/2013 08:57   Ct Maxillofacial Wo Cm  08/01/2013   CLINICAL DATA:  Encephalopathy. Evaluate for sinus disease. History of prostate cancer.  EXAM: CT HEAD WITHOUT CONTRAST  CT MAXILLOFACIAL WITHOUT CONTRAST  TECHNIQUE: Multidetector CT imaging of the head and maxillofacial structures were performed using the standard protocol without intravenous contrast. Multiplanar CT image reconstructions of the maxillofacial structures were also generated.  COMPARISON:  None.  FINDINGS: CT HEAD FINDINGS  Exam is motion degraded.  No intracranial hemorrhage.  No CT evidence of large acute infarct.  Mild atrophy. Ventricular prominence probably related to  atrophy rather than hydrocephalus.  No intracranial mass lesion noted on this unenhanced exam.  Vascular calcifications.  No bony destructive lesion/sclerosis.  CT MAXILLOFACIAL FINDINGS  Exam is motion degraded.  Minimal polypoid opacification left maxillary sinus. Remainder of paranasal sinuses are clear. Mastoid air cells and middle ear cavities are clear.  Mild haziness of fat planes upper neck as noted on recent neck CT.  Orbital structures unremarkable.  Cervical spondylotic changes incompletely assessed.  IMPRESSION: CT HEAD:  Exam is motion degraded.  No intracranial hemorrhage.  No CT evidence of large acute infarct.  Mild atrophy. Ventricular prominence probably related to atrophy rather than hydrocephalus.  Vascular calcifications.  CT MAXILLOFACIAL:  Exam is motion degraded.  Minimal polypoid opacification left maxillary sinus. Remainder of paranasal sinuses are clear. Mastoid air cells and middle ear cavities are clear.  Mild haziness of fat planes upper neck as noted on recent neck CT.   Electronically Signed   By: Chauncey Cruel M.D.   On: 08/01/2013 13:40    Microbiology: No results found for this or any previous visit (from the past 240 hour(s)).   Labs: Basic Metabolic Panel:  Recent Labs Lab 07/30/13 0800 07/31/13 0754 08/01/13 0806 08/02/13 0511 08/03/13 0554  NA 140 143 150* 142 141  K 3.6* 3.4* 3.6* 3.2* 3.8  CL 97 99 108 100 101  CO2 30 31 30  33* 30  GLUCOSE 129* 158* 103* 122* 118*  BUN 19 24* 19 9 11   CREATININE 1.38* 1.52* 1.42* 1.15 1.24  CALCIUM 8.8 9.2 8.7 8.3* 8.6  MG  --  2.6*  --   --   --   PHOS  --  3.6  --   --   --    Liver Function Tests: No results found for this basename: AST, ALT, ALKPHOS, BILITOT, PROT, ALBUMIN,  in the last 168 hours No results found for this basename: LIPASE, AMYLASE,  in the last 168 hours No results found for this basename: AMMONIA,  in the last 168 hours CBC:  Recent Labs Lab 07/29/13 0614 07/30/13 0800 08/01/13 0806   WBC 10.6* 6.9 7.0  HGB 11.1* 11.5* 10.7*  HCT 34.0* 34.9* 32.8*  MCV 82.5 81.9 83.0  PLT 241 178 185   Cardiac Enzymes: No results found for this basename: CKTOTAL, CKMB, CKMBINDEX, TROPONINI,  in the last 168 hours BNP: BNP (last 3 results) No results found for this basename: PROBNP,  in the last 8760 hours CBG: No results found for this basename: GLUCAP,  in the last 168 hours     Signed:  Cristal Ford  [facility] 08/04/2013, 11:01 AM

## 2013-08-04 NOTE — Evaluation (Signed)
Physical Therapy Evaluation Patient Details Name: Douglas Mckay MRN: [id number] DOB: 30-Aug-1931 Today's Date: 08/04/2013 Time: 0350-0938 PT Time Calculation (min): 43 min  PT Assessment / Plan / Recommendation History of Present Illness  Pt is seen for a reassessment due to changing mental status.  He has been much more confused since initial PT evaluation.  RN today states that this confusion is lessening.  He is off of supplemental O2 and he is oriented to self and place, not time.  Clinical Impression   Pt is much less confused than that seen yesterday however he has more difficulty following directions and motor planning skills are quite diminished.  His standing balance is only fair and therefore needs a walker, however using it is a challenge for him due to cognitive dysfunction.  He is a very high fall risk and wife is now agreeable for him to go to SNF.  His wife would like for him to be in the [place] area.  Discharge might be as early as today.    PT Assessment  Patient needs continued PT services    Follow Up Recommendations  SNF    Does the patient have the potential to tolerate intense rehabilitation      Barriers to Discharge        Equipment Recommendations       Recommendations for Other Services     Frequency Min 3X/week    Precautions / Restrictions Precautions Precautions: Fall Restrictions Weight Bearing Restrictions: No   Pertinent Vitals/Pain       Mobility  Bed Mobility Supine to sit: Min assist General bed mobility comments: transfer today is more labored and he needs max verbal cues for how to position himself on the bed in order for feet to touch the floor during sitting. Transfers Overall transfer level: Needs assistance Equipment used: Rolling walker (2 wheeled) Transfers: Sit to/from Stand Sit to Stand: Min guard General transfer comment: pt had significant motor planning skills...he was unable to understand how to sit down when  standing in front of a chair.   His hands had to be manually moved and trunk guided into seated position Ambulation/Gait Ambulation/Gait assistance: Mod assist Ambulation Distance (Feet): 40 Feet Assistive device: Rolling walker (2 wheeled) Gait Pattern/deviations: Shuffle;Narrow base of support;Trunk flexed;Decreased stride length Gait velocity interpretation: Below normal speed for age/gender General Gait Details: pt needed max assist to advance walker and control it during gait...his LLE tends to internally rotate and this causes him to lean to the right a bit...    Exercises General Exercises - Lower Extremity Ankle Circles/Pumps: AROM;Both;5 reps Short Arc Quad: AROM;Both;5 reps Heel Slides: AROM;Both;5 reps;AAROM Hip ABduction/ADduction: AROM;Both;5 reps;AAROM Straight Leg Raises: AROM;Both;5 reps;AAROM   PT Diagnosis: Difficulty walking;Abnormality of gait;Generalized weakness  PT Problem List: Decreased strength;Decreased activity tolerance;Decreased balance;Decreased mobility;Decreased cognition;Decreased safety awareness;Decreased knowledge of use of DME PT Treatment Interventions: Gait training;Functional mobility training;Therapeutic exercise     PT Goals(Current goals can be found in the care plan section) Acute Rehab PT Goals Patient Stated Goal: wants to go home PT Goal Formulation: With patient/family Time For Goal Achievement: 08/08/13 Potential to Achieve Goals: Good  Visit Information  Last PT Received On: 08/04/13 History of Present Illness: Pt is seen for a reassessment due to changing mental status.  He has been much more confused since initial PT evaluation.  RN today states that this confusion is lessening.  He is off of supplemental O2 and he is oriented to self and place, not  time.       Prior Functioning  Home Living Family/patient expects to be discharged to:: Skilled nursing facility    Cognition  Cognition Arousal/Alertness: Awake/alert Behavior  During Therapy: Flat affect Overall Cognitive Status: Impaired/Different from baseline Area of Impairment: Awareness;Safety/judgement;Problem solving Following Commands: Follows one step commands inconsistently;Follows one step commands with increased time Safety/Judgement: Decreased awareness of safety;Decreased awareness of deficits Awareness: Anticipatory Problem Solving: Difficulty sequencing;Requires verbal cues;Requires tactile cues;Slow processing;Decreased initiation    Extremity/Trunk Assessment Lower Extremity Assessment Lower Extremity Assessment: Generalized weakness   Balance Balance Overall balance assessment: Needs assistance Sitting-balance support: Feet supported;No upper extremity supported Sitting balance-Leahy Scale: Good Standing balance support: No upper extremity supported Standing balance-Leahy Scale: Fair  End of Session PT - End of Session Equipment Utilized During Treatment: Gait belt Activity Tolerance: Patient tolerated treatment well Patient left: in chair;with call bell/phone within reach;with chair alarm set;with family/visitor present Nurse Communication: Mobility status  GP     [name] 08/04/2013, 10:26 AM

## 2013-08-04 NOTE — Progress Notes (Signed)
Patient ID: Douglas Mckay, male   DOB: 1932/02/23, 80 y.o.   MRN: [id number]  Douglas A. Merlene Laughter, MD     [email or web address]          Douglas Mckay is an 80 y.o. male.   Assessment/Plan: 1. Likely multifactorial toxic metabolic encephalopathy. The patient seems improved today.  2. Mild the vitamin B12 deficiency. We'll give him a dose of IM injection.  No new complaints are reported. Appears to be somewhat improved today.   GENERAL: This is a thin man in no acute distress.  HEENT: Neck is supple.  ABDOMEN: soft  EXTREMITIES: No edema  BACK: Unremarkable.  SKIN: Normal by inspection.  MENTAL STATUS: Patient is awake and alert. He does have marked dysarthria which seem to be coming from his stridor. He does follow commands with repeated prompting. He is oriented 2. CRANIAL NERVES: Pupils are equal, round and reactive to light and accommodation; extra ocular movements are full, there is no significant nystagmus; visual fields are full; upper and lower facial muscles are normal in strength and symmetric, there is no flattening of the nasolabial folds; tongue is midline; uvula is midline; shoulder elevation is normal.  MOTOR: This is generally unremarkable with normal bulk and tone throughout. Exact strength is not obtainable given them. Cognition and difficulty following commands. He does have at least antigravity strength throughout.  COORDINATION: Left finger to nose is normal, right finger to nose is normal, No rest tremor; no intention tremor; no postural tremor; no bradykinesia.         Objective: Vital signs in last 24 hours: Temp:  [98 F (36.7 C)-98.1 F (36.7 C)] 98 F (36.7 C) (02/12 0400) Pulse Rate:  [102] 102 (02/11 1100) Resp:  [10-19] 11 (02/12 0700) BP: (105-191)/(65-125) 143/65 mmHg (02/12 0700) SpO2:  [96 %-100 %] 96 % (02/11 1100) Weight:  [49.9 kg (110 lb 0.2 oz)] 49.9 kg (110 lb 0.2 oz) (02/12 0500)  Intake/Output from  previous day: 02/11 0701 - 02/12 0700 In: 701 [P.O.:1; I.V.:550; IV Piggyback:150] Out: -  Intake/Output this shift: Total I/O In: -  Out: 100 [Urine:100] Nutritional status: Dysphagia   Lab Results: Results for orders placed during the hospital encounter of 07/24/13 (from the past 48 hour(s))  BASIC METABOLIC PANEL     Status: Abnormal   Collection Time    08/03/13  5:54 AM      Result Value Ref Range   Sodium 141  137 - 147 mEq/L   Potassium 3.8  3.7 - 5.3 mEq/L   Chloride 101  96 - 112 mEq/L   CO2 30  19 - 32 mEq/L   Glucose, Bld 118 (*) 70 - 99 mg/dL   BUN 11  6 - 23 mg/dL   Creatinine, Ser 1.24  0.50 - 1.35 mg/dL   Calcium 8.6  8.4 - 10.5 mg/dL   GFR calc non Af Amer 54 (*) >90 mL/min   GFR calc Af Amer 62 (*) >90 mL/min   Comment: (NOTE)     The eGFR has been calculated using the CKD EPI equation.     This calculation has not been validated in all clinical situations.     eGFR's persistently <90 mL/min signify possible Chronic Kidney     Disease.  HOMOCYSTEINE     Status: None   Collection Time    08/03/13  1:13 PM      Result Value Ref Range   Homocysteine 14.5  4.0 - 15.4 umol/L  Comment: Performed at [facility]  RPR     Status: None   Collection Time    08/03/13  1:13 PM      Result Value Ref Range   RPR NON REACTIVE  NON REACTIVE   Comment: Performed at [facility]     Status: None   Collection Time    08/03/13  1:13 PM      Result Value Ref Range   Vitamin B-12 272  211 - 911 pg/mL   Comment: Performed at [facility]  TSH     Status: None   Collection Time    08/03/13  1:13 PM      Result Value Ref Range   TSH 1.259  0.350 - 4.500 uIU/mL   Comment: Performed at [facility] Panel No results found for this basename: CHOL, TRIG, HDL, CHOLHDL, VLDL, LDLCALC,  in the last 72 hours  Studies/Results: No results found.  Medications:  Scheduled Meds: . ampicillin-sulbactam (UNASYN) IV  1.5  g Intravenous Q8H  . famotidine (PEPCID) IV  20 mg Intravenous Q24H  . feeding supplement (ENSURE COMPLETE)  237 mL Oral BID BM  . guaiFENesin  600 mg Oral 4 times per day  . haloperidol lactate  2 mg Intravenous Once  . magnesium hydroxide  30 mL Oral BID AC  . sodium chloride  3 mL Intravenous Q12H   Continuous Infusions: . dextrose 5 % with KCl 20 mEq / L 20 mEq (08/03/13 2237)   PRN Meds:.acetaminophen, acetaminophen, albuterol, LORazepam, metoprolol, ondansetron (ZOFRAN) IV, ondansetron     LOS: 11 days   Arville Postlewaite A. Merlene Mckay, M.D.  Diplomate, Tax adviser of Psychiatry and Neurology ( Neurology).

## 2013-08-08 NOTE — Progress Notes (Signed)
UR chart review completed.  

## 2013-11-08 ENCOUNTER — Inpatient Hospital Stay (HOSPITAL_COMMUNITY)
Admission: EM | Admit: 2013-11-08 | Discharge: 2013-11-21 | DRG: 393 | Disposition: E | Payer: Medicare Other | Attending: Internal Medicine | Admitting: Internal Medicine

## 2013-11-08 ENCOUNTER — Emergency Department (HOSPITAL_COMMUNITY): Payer: Medicare Other

## 2013-11-08 ENCOUNTER — Encounter (HOSPITAL_COMMUNITY): Payer: Self-pay | Admitting: Emergency Medicine

## 2013-11-08 DIAGNOSIS — A0472 Enterocolitis due to Clostridium difficile, not specified as recurrent: Secondary | ICD-10-CM

## 2013-11-08 DIAGNOSIS — D649 Anemia, unspecified: Secondary | ICD-10-CM

## 2013-11-08 DIAGNOSIS — E8809 Other disorders of plasma-protein metabolism, not elsewhere classified: Secondary | ICD-10-CM | POA: Diagnosis present

## 2013-11-08 DIAGNOSIS — Z515 Encounter for palliative care: Secondary | ICD-10-CM

## 2013-11-08 DIAGNOSIS — J690 Pneumonitis due to inhalation of food and vomit: Secondary | ICD-10-CM

## 2013-11-08 DIAGNOSIS — Z66 Do not resuscitate: Secondary | ICD-10-CM | POA: Diagnosis present

## 2013-11-08 DIAGNOSIS — Z8546 Personal history of malignant neoplasm of prostate: Secondary | ICD-10-CM

## 2013-11-08 DIAGNOSIS — E160 Drug-induced hypoglycemia without coma: Secondary | ICD-10-CM | POA: Diagnosis not present

## 2013-11-08 DIAGNOSIS — E875 Hyperkalemia: Secondary | ICD-10-CM | POA: Diagnosis not present

## 2013-11-08 DIAGNOSIS — R6521 Severe sepsis with septic shock: Secondary | ICD-10-CM

## 2013-11-08 DIAGNOSIS — E43 Unspecified severe protein-calorie malnutrition: Secondary | ICD-10-CM

## 2013-11-08 DIAGNOSIS — IMO0002 Reserved for concepts with insufficient information to code with codable children: Secondary | ICD-10-CM

## 2013-11-08 DIAGNOSIS — A419 Sepsis, unspecified organism: Secondary | ICD-10-CM

## 2013-11-08 DIAGNOSIS — E876 Hypokalemia: Secondary | ICD-10-CM | POA: Diagnosis present

## 2013-11-08 DIAGNOSIS — I10 Essential (primary) hypertension: Secondary | ICD-10-CM | POA: Diagnosis present

## 2013-11-08 DIAGNOSIS — G9340 Encephalopathy, unspecified: Secondary | ICD-10-CM

## 2013-11-08 DIAGNOSIS — K4090 Unilateral inguinal hernia, without obstruction or gangrene, not specified as recurrent: Secondary | ICD-10-CM

## 2013-11-08 DIAGNOSIS — Z823 Family history of stroke: Secondary | ICD-10-CM

## 2013-11-08 DIAGNOSIS — Z87891 Personal history of nicotine dependence: Secondary | ICD-10-CM

## 2013-11-08 DIAGNOSIS — E7800 Pure hypercholesterolemia, unspecified: Secondary | ICD-10-CM | POA: Diagnosis present

## 2013-11-08 DIAGNOSIS — T383X5A Adverse effect of insulin and oral hypoglycemic [antidiabetic] drugs, initial encounter: Secondary | ICD-10-CM

## 2013-11-08 DIAGNOSIS — K56609 Unspecified intestinal obstruction, unspecified as to partial versus complete obstruction: Secondary | ICD-10-CM

## 2013-11-08 DIAGNOSIS — J9601 Acute respiratory failure with hypoxia: Secondary | ICD-10-CM

## 2013-11-08 DIAGNOSIS — D696 Thrombocytopenia, unspecified: Secondary | ICD-10-CM

## 2013-11-08 DIAGNOSIS — R6520 Severe sepsis without septic shock: Secondary | ICD-10-CM

## 2013-11-08 DIAGNOSIS — J9600 Acute respiratory failure, unspecified whether with hypoxia or hypercapnia: Secondary | ICD-10-CM | POA: Diagnosis present

## 2013-11-08 DIAGNOSIS — E161 Other hypoglycemia: Secondary | ICD-10-CM | POA: Diagnosis not present

## 2013-11-08 DIAGNOSIS — K4030 Unilateral inguinal hernia, with obstruction, without gangrene, not specified as recurrent: Principal | ICD-10-CM | POA: Diagnosis present

## 2013-11-08 LAB — COMPREHENSIVE METABOLIC PANEL
ALT: 16 U/L (ref 0–53)
AST: 22 U/L (ref 0–37)
Albumin: 4.3 g/dL (ref 3.5–5.2)
Alkaline Phosphatase: 65 U/L (ref 39–117)
BILIRUBIN TOTAL: 0.4 mg/dL (ref 0.3–1.2)
BUN: 26 mg/dL — AB (ref 6–23)
CHLORIDE: 105 meq/L (ref 96–112)
CO2: 28 mEq/L (ref 19–32)
Calcium: 9.5 mg/dL (ref 8.4–10.5)
Creatinine, Ser: 1.19 mg/dL (ref 0.50–1.35)
GFR calc non Af Amer: 56 mL/min — ABNORMAL LOW (ref >90)
GFR, EST AFRICAN AMERICAN: 65 mL/min — AB (ref >90)
GLUCOSE: 204 mg/dL — AB (ref 70–99)
POTASSIUM: 3.9 meq/L (ref 3.7–5.3)
Sodium: 148 mEq/L — ABNORMAL HIGH (ref 137–147)
Total Protein: 7.5 g/dL (ref 6.0–8.3)

## 2013-11-08 LAB — CBC WITH DIFFERENTIAL/PLATELET
Basophils Absolute: 0 10*3/uL (ref 0.0–0.1)
Basophils Relative: 0 % (ref 0–1)
Eosinophils Absolute: 0 10*3/uL (ref 0.0–0.7)
Eosinophils Relative: 0 % (ref 0–5)
HEMATOCRIT: 39.4 % (ref 39.0–52.0)
Hemoglobin: 12.4 g/dL — ABNORMAL LOW (ref 13.0–17.0)
Lymphocytes Relative: 7 % — ABNORMAL LOW (ref 12–46)
Lymphs Abs: 0.2 10*3/uL — ABNORMAL LOW (ref 0.7–4.0)
MCH: 26.6 pg (ref 26.0–34.0)
MCHC: 31.5 g/dL (ref 30.0–36.0)
MCV: 84.5 fL (ref 78.0–100.0)
MONOS PCT: 4 % (ref 3–12)
Monocytes Absolute: 0.2 10*3/uL (ref 0.1–1.0)
NEUTROS ABS: 3.1 10*3/uL (ref 1.7–7.7)
NEUTROS PCT: 89 % — AB (ref 43–77)
Platelets: 152 10*3/uL (ref 150–400)
RBC: 4.66 MIL/uL (ref 4.22–5.81)
RDW: 14.1 % (ref 11.5–15.5)
WBC: 3.5 10*3/uL — ABNORMAL LOW (ref 4.0–10.5)

## 2013-11-08 LAB — LIPASE, BLOOD: LIPASE: 22 U/L (ref 11–59)

## 2013-11-08 MED ORDER — FAMOTIDINE IN NACL 20-0.9 MG/50ML-% IV SOLN
20.0000 mg | Freq: Once | INTRAVENOUS | Status: AC
  Administered 2013-11-08: 20 mg via INTRAVENOUS
  Filled 2013-11-08: qty 50

## 2013-11-08 MED ORDER — SODIUM CHLORIDE 0.9 % IV SOLN
Freq: Once | INTRAVENOUS | Status: AC
  Administered 2013-11-08: 23:00:00 via INTRAVENOUS

## 2013-11-08 MED ORDER — IOHEXOL 300 MG/ML  SOLN
50.0000 mL | Freq: Once | INTRAMUSCULAR | Status: AC | PRN
  Administered 2013-11-08: 50 mL via ORAL

## 2013-11-08 MED ORDER — MORPHINE SULFATE 4 MG/ML IJ SOLN
4.0000 mg | INTRAMUSCULAR | Status: AC | PRN
  Administered 2013-11-08 – 2013-11-09 (×3): 4 mg via INTRAVENOUS
  Filled 2013-11-08 (×3): qty 1

## 2013-11-08 MED ORDER — IBUPROFEN 800 MG PO TABS: 800.0000 mg | ORAL_TABLET | Freq: Once | ORAL | Status: DC

## 2013-11-08 MED ORDER — POTASSIUM CHLORIDE CRYS ER 20 MEQ PO TBCR: 40.0000 meq | EXTENDED_RELEASE_TABLET | Freq: Once | ORAL | Status: DC

## 2013-11-08 MED ORDER — IOHEXOL 300 MG/ML  SOLN
100.0000 mL | Freq: Once | INTRAMUSCULAR | Status: AC | PRN
  Administered 2013-11-08: 100 mL via INTRAVENOUS

## 2013-11-08 MED ORDER — ONDANSETRON HCL 4 MG/2ML IJ SOLN
4.0000 mg | Freq: Once | INTRAMUSCULAR | Status: AC
  Administered 2013-11-08: 4 mg via INTRAVENOUS
  Filled 2013-11-08: qty 2

## 2013-11-08 MED ORDER — SODIUM CHLORIDE 0.9 % IJ SOLN
INTRAMUSCULAR | Status: AC
  Filled 2013-11-08: qty 500

## 2013-11-08 NOTE — ED Notes (Signed)
Patient medicated for abdominal pain rated 7-8/10.  Patient finished one bottle of oral contrast and states he feels too full to finish second bottle.  Bambi, CT tech, notified and she stated to stop at one bottle.

## 2013-11-08 NOTE — ED Notes (Signed)
Pt abd pain epigastric area since 1600 with N/V, denies diarrhea

## 2013-11-08 NOTE — ED Provider Notes (Signed)
CSN: [id number]     Arrival date & time Nov 17, 2013  2030 History   First MD Initiated Contact with Patient Nov 17, 2013 2144  This chart was scribed for Tanna Furry, MD by Anastasia Pall, ED Scribe. This patient was seen in room APA06/APA06 and the patient's care was started at 9:50 PM.    Chief Complaint  Patient presents with  . Abdominal Pain   (Consider location/radiation/quality/duration/timing/severity/associated sxs/prior Treatment) The history is provided by the patient. No language interpreter was used.   HPI Comments: Douglas Mckay is a 80 y.o. male who presents to the Emergency Department complaining of constant, epigastric abdominal pain, with associated nausea and vomiting, onset 1600 this afternoon. He denies vomiting relieve his abdominal pain. He also reports associated back pain around his mid portion. He denies h/o similar symptoms. He reports intermittent constipation at baseline. He reports it has been 2-3 days since he last had a bowel movement. He denies diarrhea, chest pain, SOB, and any other associated symptoms. He reports h/o prostate cancer. He is s/p robotic assisted Prostatectomy. He denies h/o appendectomy, cholecystectomy. He denies h/o abdominal aneurism.   He also reports tremors onset 1 month ago. Pt reports he has been walking normally, but wife states she notices him taking short choppy steps.  PCP - Marjo Bicker, MD  Past Medical History  Diagnosis Date  . Hypertension   . Hypercholesterolemia   . Cancer     prostate   Past Surgical History  Procedure Laterality Date  . Prostatectomy     Family History  Problem Relation Age of Onset  . Stroke Mother    History  Substance Use Topics  . Smoking status: Former Research scientist (life sciences)  . Smokeless tobacco: Not on file  . Alcohol Use: No    Review of Systems  Constitutional: Negative for fever, chills, diaphoresis, appetite change and fatigue.  HENT: Negative for mouth sores, sore throat and trouble swallowing.    Eyes: Negative for visual disturbance.  Respiratory: Negative for cough, chest tightness, shortness of breath and wheezing.   Cardiovascular: Negative for chest pain.  Gastrointestinal: Positive for nausea, vomiting and abdominal pain (epigastric). Negative for diarrhea and abdominal distention.  Endocrine: Negative for polydipsia, polyphagia and polyuria.  Genitourinary: Negative for dysuria, frequency and hematuria.  Musculoskeletal: Negative for gait problem.  Skin: Negative for color change, pallor and rash.  Neurological: Positive for tremors (bilateral UE). Negative for dizziness, syncope, weakness, light-headedness, numbness and headaches.  Hematological: Does not bruise/bleed easily.  Psychiatric/Behavioral: Negative for behavioral problems and confusion.   Allergies  Solu-medrol  Home Medications   Prior to Admission medications   Medication Sig Start Date End Date Taking? Authorizing Provider  acetaminophen (TYLENOL) 325 MG tablet Take 2 tablets (650 mg total) by mouth every 6 (six) hours as needed for mild pain (or Fever >/= 101). 08/04/13   Maryann Mikhail, DO  amLODipine-benazepril (LOTREL) 5-40 MG per capsule Take 1 capsule by mouth daily.    Historical Provider, MD  carvedilol (COREG) 3.125 MG tablet Take 3.125 mg by mouth daily.    Historical Provider, MD  feeding supplement, ENSURE COMPLETE, (ENSURE COMPLETE) LIQD Take 237 mLs by mouth 2 (two) times daily between meals. 08/04/13   Maryann Mikhail, DO  guaiFENesin (ROBITUSSIN) 100 MG/5ML SOLN Take 30 mLs (600 mg total) by mouth every 6 (six) hours. 08/04/13   Maryann Mikhail, DO  magnesium hydroxide (MILK OF MAGNESIA) 400 MG/5ML suspension Take 30 mLs by mouth 2 (two) times daily before a meal. 08/04/13  Maryann Mikhail, DO  ondansetron (ZOFRAN) 4 MG tablet Take 1 tablet (4 mg total) by mouth every 6 (six) hours as needed for nausea. 08/04/13   Maryann Mikhail, DO  simvastatin (ZOCOR) 20 MG tablet Take 20 mg by mouth every  evening.    Historical Provider, MD   BP 167/82  Pulse 81  Temp(Src) 98 F (36.7 C) (Oral)  Resp 18  Ht 5\' 1"  (1.549 m)  Wt 135 lb (61.236 kg)  BMI 25.52 kg/m2  SpO2 100%  Physical Exam  Constitutional: He is oriented to person, place, and time. He appears well-developed and well-nourished. No distress.  HENT:  Head: Normocephalic.  Eyes: Conjunctivae and EOM are normal. Pupils are equal, round, and reactive to light. No scleral icterus.  Neck: Neck supple.  Cardiovascular: Normal rate and regular rhythm.  Exam reveals no gallop and no friction rub.   No murmur heard. Pulmonary/Chest: Effort normal and breath sounds normal. No respiratory distress. He has no wheezes. He has no rales.  Abdominal: Soft. He exhibits distension. There is tenderness in the epigastric area. A hernia is present. Hernia confirmed positive in the right inguinal area.  Epigastric and periumbilical tenderness.  No abdominal bruits. No pulsatile masses. Slightly hypoactive bowel sounds, without high pitch rushes.  Distended, but not rigid.  Visible and palpable RIH, freely reduces, non tender.   Musculoskeletal: Normal range of motion. He exhibits no tenderness.  Neurological: He is alert and oriented to person, place, and time.  Resting tremor bilateral UE.   Skin: Skin is warm and dry. No rash noted.  Psychiatric: He has a normal mood and affect. His behavior is normal.   ED Course  Procedures (including critical care time)  DIAGNOSTIC STUDIES: Oxygen Saturation is 100% on room air, normal by my interpretation.    COORDINATION OF CARE: 9:56 PM - Discussed treatment plan which includes US abdomen, IV fluids, pain medication, blood work, and CT abdomen with pt at bedside and pt agreed to plan.   9:58 PM - US abdomen to rule out aneurism.  10:00 PM - No aneurism noted.   Results for orders placed during the hospital encounter of 11/20/2013  CBC WITH DIFFERENTIAL      Result Value Ref Range   WBC 3.5  (*) 4.0 - 10.5 K/uL   RBC 4.66  4.22 - 5.81 MIL/uL   Hemoglobin 12.4 (*) 13.0 - 17.0 g/dL   HCT 39.4  39.0 - 52.0 %   MCV 84.5  78.0 - 100.0 fL   MCH 26.6  26.0 - 34.0 pg   MCHC 31.5  30.0 - 36.0 g/dL   RDW 14.1  11.5 - 15.5 %   Platelets 152  150 - 400 K/uL   Neutrophils Relative % 89 (*) 43 - 77 %   Neutro Abs 3.1  1.7 - 7.7 K/uL   Lymphocytes Relative 7 (*) 12 - 46 %   Lymphs Abs 0.2 (*) 0.7 - 4.0 K/uL   Monocytes Relative 4  3 - 12 %   Monocytes Absolute 0.2  0.1 - 1.0 K/uL   Eosinophils Relative 0  0 - 5 %   Eosinophils Absolute 0.0  0.0 - 0.7 K/uL   Basophils Relative 0  0 - 1 %   Basophils Absolute 0.0  0.0 - 0.1 K/uL  COMPREHENSIVE METABOLIC PANEL      Result Value Ref Range   Sodium 148 (*) 137 - 147 mEq/L   Potassium 3.9  3.7 - 5.3 mEq/L  Chloride 105  96 - 112 mEq/L   CO2 28  19 - 32 mEq/L   Glucose, Bld 204 (*) 70 - 99 mg/dL   BUN 26 (*) 6 - 23 mg/dL   Creatinine, Ser 1.19  0.50 - 1.35 mg/dL   Calcium 9.5  8.4 - 10.5 mg/dL   Total Protein 7.5  6.0 - 8.3 g/dL   Albumin 4.3  3.5 - 5.2 g/dL   AST 22  0 - 37 U/L   ALT 16  0 - 53 U/L   Alkaline Phosphatase 65  39 - 117 U/L   Total Bilirubin 0.4  0.3 - 1.2 mg/dL   GFR calc non Af Amer 56 (*) >90 mL/min   GFR calc Af Amer 65 (*) >90 mL/min  LIPASE, BLOOD      Result Value Ref Range   Lipase 22  11 - 59 U/L   Ct Abdomen Pelvis W Contrast  11/17/2013   CLINICAL DATA:  Epigastric pain, nausea and vomiting and mid back pain. History prostate cancer status post prostatectomy.  EXAM: CT ABDOMEN AND PELVIS WITH CONTRAST  TECHNIQUE: Multidetector CT imaging of the abdomen and pelvis was performed using the standard protocol following bolus administration of intravenous contrast.  CONTRAST:  10mL OMNIPAQUE IOHEXOL 300 MG/ML SOLN, 181mL OMNIPAQUE IOHEXOL 300 MG/ML SOLN  COMPARISON:  CT of the abdomen and pelvis 07/25/2013.  FINDINGS: Lung Bases: Patulous fluid-filled distal esophagus. Atherosclerotic calcifications in the right  coronary artery.  Abdomen/Pelvis: Large right inguinal hernia containing multiple loops of small bowel. Proximal to this there are multiple dilated loops of small bowel which measure up to 5.8 cm in diameter, with multiple air-fluid levels, compatible with bowel obstruction. Although the mucosa of some of these bowel loops appears to enhance normally, the mucosa of some other bowel loops fail to enhance, which could suggest some bowel ischemia. No definite pneumatosis is noted at this time. No portal venous gas. Small volume of ascites and interloop fluid. No pneumoperitoneum. A suture line is noted in the right side of the abdomen, presumably from prior partial small bowel resection. Some gas and stool is noted throughout the colon, indicating that this obstruction is likely new. Stomach is moderately distended.  There are multiple sub cm low-attenuation lesions scattered throughout the liver which are too small to characterize, but are favored to represent tiny cysts. Gallbladder is unremarkable in appearance. The appearance of the pancreas, spleen and bilateral adrenal glands is unremarkable. Numerous sub cm low-attenuation lesions scattered throughout the kidneys bilaterally are too small to definitively characterize, but are favored to represent tiny cysts. Atherosclerosis throughout the abdominal and pelvic vasculature, without evidence of aneurysm or dissection. No definite lymphadenopathy identified within the abdomen or pelvis. Status post prostatectomy. Urinary bladder is unremarkable in appearance.  Musculoskeletal: There are no aggressive appearing lytic or blastic lesions noted in the visualized portions of the skeleton.  IMPRESSION: 1. Right inguinal hernia with associated small bowel obstruction, and decreased enhancement of the mucosa of several dilated small bowel loops, which is suggestive of early changes of bowel ischemia. There is a small volume of ascites and interloop fluid, which is  presumably reactive. No pneumoperitoneum to strongly suggest frank perforation at this time. Immediate surgical consultation is strongly recommended. 2. Additional incidental findings, as above. Critical Value/emergent results were called by telephone at the time of interpretation on 11/14/2013 at 11:43 PM to Dr. Tanna Furry, who verbally acknowledged these results.   Electronically Signed   By: Quillian Quince  Entrikin M.D.   On: 11/03/2013 23:54    EKG Interpretation None     Medications  morphine 4 MG/ML injection 4 mg (4 mg Intravenous Given 10/23/2013 2302)  sodium chloride 0.9 % injection (not administered)  ondansetron (ZOFRAN) injection 4 mg (4 mg Intravenous Given 11/05/2013 2236)  famotidine (PEPCID) IVPB 20 mg (0 mg Intravenous Stopped 10/22/2013 2314)  0.9 %  sodium chloride infusion ( Intravenous New Bag/Given 11/11/2013 2236)  iohexol (OMNIPAQUE) 300 MG/ML solution 50 mL (50 mLs Oral Contrast Given 11/04/2013 2220)  iohexol (OMNIPAQUE) 300 MG/ML solution 100 mL (100 mLs Intravenous Contrast Given 11/13/2013 2311)   MDM   Final diagnoses:  Small bowel obstruction  Inguinal hernia   CT scan shows small bowel obstruction.  His angle hernia is nonpainful to exam and freely reduced this. An NG tube was placed. His abdomen decompresses. He has no current pain. He is not acidotic. He does have a leukocytosis. CT scan shows some changes or decreased enhancement mucosa asthma dilated small bowel loops prior to NG tube placement. I discussed the case with Dr. Arnoldo Morale. Patient is comfortable with NG tube. Plan will be admission. Hydration. Patient is to be admitted under the care Dr. Arnoldo Morale.   I personally performed the services described in this documentation, which was scribed in my presence. The recorded information has been reviewed and is accurate.   Tanna Furry, MD 11/09/13 0100

## 2013-11-09 ENCOUNTER — Inpatient Hospital Stay (HOSPITAL_COMMUNITY): Payer: Medicare Other

## 2013-11-09 ENCOUNTER — Encounter (HOSPITAL_COMMUNITY): Payer: Medicare Other | Admitting: Anesthesiology

## 2013-11-09 ENCOUNTER — Inpatient Hospital Stay (HOSPITAL_COMMUNITY): Payer: Medicare Other | Admitting: Anesthesiology

## 2013-11-09 DIAGNOSIS — G9340 Encephalopathy, unspecified: Secondary | ICD-10-CM

## 2013-11-09 DIAGNOSIS — K56609 Unspecified intestinal obstruction, unspecified as to partial versus complete obstruction: Secondary | ICD-10-CM | POA: Diagnosis present

## 2013-11-09 DIAGNOSIS — J9600 Acute respiratory failure, unspecified whether with hypoxia or hypercapnia: Secondary | ICD-10-CM

## 2013-11-09 DIAGNOSIS — J690 Pneumonitis due to inhalation of food and vomit: Secondary | ICD-10-CM

## 2013-11-09 LAB — BLOOD GAS, ARTERIAL
ACID-BASE EXCESS: 0.7 mmol/L (ref 0.0–2.0)
Acid-base deficit: 0.3 mmol/L (ref 0.0–2.0)
Acid-base deficit: 0.2 mmol/L (ref 0.0–2.0)
BICARBONATE: 25.4 meq/L — AB (ref 20.0–24.0)
BICARBONATE: 23.4 meq/L (ref 20.0–24.0)
Drawn by: 234301
FIO2: 0.28 %
FIO2: 100 %
O2 CONTENT: 2 L/min
O2 SAT: 94.4 %
O2 Saturation: 82.9 %
PATIENT TEMPERATURE: 37
PCO2 ART: 45.5 mmHg — AB (ref 35.0–45.0)
PEEP: 5 cmH2O
PH ART: 7.442 (ref 7.350–7.450)
PO2 ART: 50.2 mmHg — AB (ref 80.0–100.0)
Patient temperature: 37
RATE: 15 resp/min
TCO2: 23.3 mmol/L (ref 0–100)
TCO2: 21.2 mmol/L (ref 0–100)
VT: 500 mL
pCO2 arterial: 34.8 mmHg — ABNORMAL LOW (ref 35.0–45.0)
pH, Arterial: 7.365 (ref 7.350–7.450)
pO2, Arterial: 69.8 mmHg — ABNORMAL LOW (ref 80.0–100.0)

## 2013-11-09 LAB — CARBOXYHEMOGLOBIN
CARBOXYHEMOGLOBIN: 1 % (ref 0.5–1.5)
METHEMOGLOBIN: 1.1 % (ref 0.0–1.5)
O2 SAT: 64.6 %
Total hemoglobin: 10.5 g/dL — ABNORMAL LOW (ref 13.5–18.0)
Total oxygen content: 9.3 mL/dL — ABNORMAL LOW (ref 15.0–23.0)

## 2013-11-09 LAB — CBC
HEMATOCRIT: 34.5 % — AB (ref 39.0–52.0)
Hemoglobin: 11 g/dL — ABNORMAL LOW (ref 13.0–17.0)
MCH: 26.9 pg (ref 26.0–34.0)
MCHC: 31.9 g/dL (ref 30.0–36.0)
MCV: 84.4 fL (ref 78.0–100.0)
Platelets: 107 10*3/uL — ABNORMAL LOW (ref 150–400)
RBC: 4.09 MIL/uL — AB (ref 4.22–5.81)
RDW: 14.2 % (ref 11.5–15.5)
WBC: 2 10*3/uL — AB (ref 4.0–10.5)

## 2013-11-09 LAB — BASIC METABOLIC PANEL
BUN: 23 mg/dL (ref 6–23)
CHLORIDE: 110 meq/L (ref 96–112)
CO2: 23 meq/L (ref 19–32)
Calcium: 7.6 mg/dL — ABNORMAL LOW (ref 8.4–10.5)
Creatinine, Ser: 1.28 mg/dL (ref 0.50–1.35)
GFR calc Af Amer: 60 mL/min — ABNORMAL LOW (ref >90)
GFR calc non Af Amer: 52 mL/min — ABNORMAL LOW (ref >90)
Glucose, Bld: 107 mg/dL — ABNORMAL HIGH (ref 70–99)
Potassium: 4 mEq/L (ref 3.7–5.3)
Sodium: 146 mEq/L (ref 137–147)

## 2013-11-09 LAB — PRO B NATRIURETIC PEPTIDE: PRO B NATRI PEPTIDE: 1241 pg/mL — AB (ref 0–450)

## 2013-11-09 LAB — LACTIC ACID, PLASMA: Lactic Acid, Venous: 3.7 mmol/L — ABNORMAL HIGH (ref 0.5–2.2)

## 2013-11-09 MED ORDER — VANCOMYCIN HCL IN DEXTROSE 750-5 MG/150ML-% IV SOLN
750.0000 mg | INTRAVENOUS | Status: DC
  Administered 2013-11-09 – 2013-11-12 (×4): 750 mg via INTRAVENOUS
  Filled 2013-11-09 (×5): qty 150

## 2013-11-09 MED ORDER — SODIUM CHLORIDE 0.9 % IV BOLUS (SEPSIS)
1000.0000 mL | INTRAVENOUS | Status: DC | PRN
  Administered 2013-11-10: 1000 mL via INTRAVENOUS

## 2013-11-09 MED ORDER — ONDANSETRON HCL 4 MG/2ML IJ SOLN: 4.0000 mg | Freq: Three times a day (TID) | INTRAMUSCULAR | Status: AC | PRN

## 2013-11-09 MED ORDER — METOPROLOL TARTRATE 1 MG/ML IV SOLN
2.5000 mg | Freq: Four times a day (QID) | INTRAVENOUS | Status: DC
  Administered 2013-11-09: 2.5 mg via INTRAVENOUS
  Filled 2013-11-09: qty 5

## 2013-11-09 MED ORDER — LORAZEPAM 2 MG/ML IJ SOLN
1.0000 mg | INTRAMUSCULAR | Status: DC | PRN
  Administered 2013-11-11 – 2013-11-14 (×8): 1 mg via INTRAVENOUS
  Administered 2013-11-14: 0.5 mg via INTRAVENOUS
  Administered 2013-11-15 – 2013-11-16 (×4): 1 mg via INTRAVENOUS
  Administered 2013-11-17: 0.5 mg via INTRAVENOUS
  Administered 2013-11-18: 1 mg via INTRAVENOUS
  Filled 2013-11-09 (×17): qty 1

## 2013-11-09 MED ORDER — FAMOTIDINE IN NACL 20-0.9 MG/50ML-% IV SOLN
20.0000 mg | Freq: Two times a day (BID) | INTRAVENOUS | Status: DC
  Administered 2013-11-09 – 2013-11-18 (×17): 20 mg via INTRAVENOUS
  Filled 2013-11-09 (×25): qty 50

## 2013-11-09 MED ORDER — KCL IN DEXTROSE-NACL 20-5-0.45 MEQ/L-%-% IV SOLN
INTRAVENOUS | Status: DC
  Administered 2013-11-09: 10:00:00 via INTRAVENOUS

## 2013-11-09 MED ORDER — PHENYLEPHRINE HCL 10 MG/ML IJ SOLN
30.0000 ug/min | INTRAVENOUS | Status: DC
  Administered 2013-11-09: 30 ug/min via INTRAVENOUS
  Filled 2013-11-09: qty 1

## 2013-11-09 MED ORDER — SODIUM CHLORIDE 0.9 % IV BOLUS (SEPSIS)
1000.0000 mL | Freq: Once | INTRAVENOUS | Status: AC
  Administered 2013-11-09: 1000 mL via INTRAVENOUS

## 2013-11-09 MED ORDER — ENALAPRILAT 1.25 MG/ML IV SOLN
1.2500 mg | Freq: Four times a day (QID) | INTRAVENOUS | Status: DC
  Administered 2013-11-09: 1.25 mg via INTRAVENOUS
  Filled 2013-11-09: qty 2

## 2013-11-09 MED ORDER — ACETAMINOPHEN 650 MG RE SUPP
650.0000 mg | Freq: Four times a day (QID) | RECTAL | Status: DC | PRN
Start: 2013-11-09 — End: 2013-11-19
  Administered 2013-11-10 – 2013-11-15 (×4): 650 mg via RECTAL
  Filled 2013-11-09 (×5): qty 1

## 2013-11-09 MED ORDER — LORAZEPAM 2 MG/ML IJ SOLN
1.0000 mg | INTRAMUSCULAR | Status: DC
  Administered 2013-11-09: 1 mg via INTRAVENOUS
  Filled 2013-11-09: qty 1

## 2013-11-09 MED ORDER — MIDAZOLAM HCL 2 MG/2ML IJ SOLN
INTRAMUSCULAR | Status: AC
  Administered 2013-11-09: 2 mg
  Filled 2013-11-09: qty 2

## 2013-11-09 MED ORDER — CHLORHEXIDINE GLUCONATE 0.12 % MT SOLN
15.0000 mL | Freq: Two times a day (BID) | OROMUCOSAL | Status: DC
  Administered 2013-11-09 – 2013-11-18 (×18): 15 mL via OROMUCOSAL
  Filled 2013-11-09 (×17): qty 15

## 2013-11-09 MED ORDER — IPRATROPIUM-ALBUTEROL 0.5-2.5 (3) MG/3ML IN SOLN: 3.0000 mL | RESPIRATORY_TRACT | Status: DC

## 2013-11-09 MED ORDER — NALOXONE HCL 1 MG/ML IJ SOLN
INTRAMUSCULAR | Status: AC
  Administered 2013-11-09: 0.4 mg
  Filled 2013-11-09: qty 2

## 2013-11-09 MED ORDER — BIOTENE DRY MOUTH MT LIQD
15.0000 mL | Freq: Four times a day (QID) | OROMUCOSAL | Status: DC
  Administered 2013-11-10 – 2013-11-18 (×34): 15 mL via OROMUCOSAL

## 2013-11-09 MED ORDER — FENTANYL CITRATE 0.05 MG/ML IJ SOLN
50.0000 ug | INTRAMUSCULAR | Status: DC | PRN
  Administered 2013-11-09: 50 ug via INTRAVENOUS
  Filled 2013-11-09 (×2): qty 2

## 2013-11-09 MED ORDER — NALOXONE HCL 0.4 MG/ML IJ SOLN: 0.4000 mg | INTRAMUSCULAR | Status: DC | PRN

## 2013-11-09 MED ORDER — ALBUTEROL SULFATE (2.5 MG/3ML) 0.083% IN NEBU: 2.5000 mg | INHALATION_SOLUTION | RESPIRATORY_TRACT | Status: DC

## 2013-11-09 MED ORDER — ONDANSETRON HCL 4 MG/2ML IJ SOLN
4.0000 mg | Freq: Four times a day (QID) | INTRAMUSCULAR | Status: DC | PRN
  Administered 2013-11-09: 4 mg via INTRAVENOUS
  Filled 2013-11-09: qty 2

## 2013-11-09 MED ORDER — PHENYLEPHRINE HCL 10 MG/ML IJ SOLN
INTRAMUSCULAR | Status: AC
  Filled 2013-11-09: qty 1

## 2013-11-09 MED ORDER — MIDAZOLAM HCL 2 MG/2ML IJ SOLN: 2.0000 mg | Freq: Once | INTRAMUSCULAR | Status: AC

## 2013-11-09 MED ORDER — SUCCINYLCHOLINE CHLORIDE 20 MG/ML IJ SOLN
INTRAMUSCULAR | Status: DC | PRN
  Administered 2013-11-09: 100 mg via INTRAVENOUS

## 2013-11-09 MED ORDER — ALBUTEROL SULFATE (2.5 MG/3ML) 0.083% IN NEBU: 2.5000 mg | INHALATION_SOLUTION | RESPIRATORY_TRACT | Status: DC | PRN

## 2013-11-09 MED ORDER — SODIUM CHLORIDE 0.9 % IV BOLUS (SEPSIS)
500.0000 mL | Freq: Once | INTRAVENOUS | Status: AC
  Administered 2013-11-09: 500 mL via INTRAVENOUS

## 2013-11-09 MED ORDER — ETOMIDATE 2 MG/ML IV SOLN
INTRAVENOUS | Status: DC | PRN
  Administered 2013-11-09: 12 mg via INTRAVENOUS

## 2013-11-09 MED ORDER — SODIUM CHLORIDE 0.9 % IJ SOLN
10.0000 mL | INTRAMUSCULAR | Status: DC | PRN
  Administered 2013-11-14: 3 mL

## 2013-11-09 MED ORDER — ACETAMINOPHEN 325 MG PO TABS
650.0000 mg | ORAL_TABLET | Freq: Four times a day (QID) | ORAL | Status: DC | PRN
  Administered 2013-11-11: 650 mg via ORAL

## 2013-11-09 MED ORDER — DEXTROSE-NACL 5-0.45 % IV SOLN
INTRAVENOUS | Status: AC
  Administered 2013-11-09: 03:00:00 via INTRAVENOUS

## 2013-11-09 MED ORDER — SODIUM CHLORIDE 0.9 % IV SOLN
INTRAVENOUS | Status: DC
  Administered 2013-11-09 – 2013-11-13 (×4): via INTRAVENOUS

## 2013-11-09 MED ORDER — FENTANYL CITRATE 0.05 MG/ML IJ SOLN
50.0000 ug | INTRAMUSCULAR | Status: DC | PRN
  Administered 2013-11-09: 50 ug via INTRAVENOUS
  Filled 2013-11-09: qty 2

## 2013-11-09 MED ORDER — ENOXAPARIN SODIUM 40 MG/0.4ML ~~LOC~~ SOLN
40.0000 mg | SUBCUTANEOUS | Status: DC
  Administered 2013-11-09 – 2013-11-18 (×10): 40 mg via SUBCUTANEOUS
  Filled 2013-11-09 (×10): qty 0.4

## 2013-11-09 MED ORDER — SODIUM CHLORIDE 0.9 % IJ SOLN
10.0000 mL | Freq: Two times a day (BID) | INTRAMUSCULAR | Status: DC
  Administered 2013-11-10: 10 mL
  Administered 2013-11-11: 40 mL
  Administered 2013-11-11 – 2013-11-12 (×2): 10 mL
  Administered 2013-11-12 – 2013-11-13 (×2): 30 mL
  Administered 2013-11-14 – 2013-11-18 (×5): 10 mL

## 2013-11-09 MED ORDER — IPRATROPIUM BROMIDE 0.02 % IN SOLN: 0.5000 mg | RESPIRATORY_TRACT | Status: DC

## 2013-11-09 MED ORDER — IPRATROPIUM-ALBUTEROL 0.5-2.5 (3) MG/3ML IN SOLN
3.0000 mL | RESPIRATORY_TRACT | Status: DC
  Administered 2013-11-09 – 2013-11-17 (×48): 3 mL via RESPIRATORY_TRACT
  Filled 2013-11-09 (×46): qty 3

## 2013-11-09 MED ORDER — MORPHINE SULFATE 2 MG/ML IJ SOLN: 2.0000 mg | INTRAMUSCULAR | Status: DC | PRN

## 2013-11-09 MED ORDER — MORPHINE SULFATE 2 MG/ML IJ SOLN
2.0000 mg | INTRAMUSCULAR | Status: DC | PRN
  Administered 2013-11-09: 2 mg via INTRAVENOUS
  Filled 2013-11-09: qty 1

## 2013-11-09 MED ORDER — PIPERACILLIN-TAZOBACTAM 3.375 G IVPB
3.3750 g | Freq: Three times a day (TID) | INTRAVENOUS | Status: DC
  Administered 2013-11-09 – 2013-11-16 (×20): 3.375 g via INTRAVENOUS
  Filled 2013-11-09 (×21): qty 50

## 2013-11-09 MED ORDER — SODIUM CHLORIDE 0.9 % IV BOLUS (SEPSIS)
1000.0000 mL | INTRAVENOUS | Status: AC | PRN
  Administered 2013-11-09: 1000 mL via INTRAVENOUS
  Administered 2013-11-10: 750 mL via INTRAVENOUS

## 2013-11-09 NOTE — Progress Notes (Signed)
Events of this evening noted. Appreciate Dr. Blythe Stanford assistance. Portable chest x-ray did not reveal pneumoperitoneum. Suspect this is aspiration pneumonia given chest x-ray findings. Patient being transferred to ICU intubated on hospitalist service. Will continue to follow with you.

## 2013-11-09 NOTE — Progress Notes (Signed)
eLink Physician-Brief Progress Note Patient Name: Douglas Mckay DOB: 11/18/1931 MRN: [id number]  Date of Service  11/09/2013   HPI/Events of Note   Worsening shock > likely septic shock from aspiration pneumonia vs abdominal process   eICU Interventions  Discussed with general surgery who will place a central line Sepsis protocol Continue current antibiotics   Intervention Category Major Interventions: Shock - evaluation and management  Juanito Doom 11/09/2013, 10:47 PM

## 2013-11-09 NOTE — ED Notes (Signed)
Family at bedside. Patient states that he is having extreme pain in lower part of stomach. Will make RN aware of this.

## 2013-11-09 NOTE — Progress Notes (Signed)
eLink Physician-Brief Progress Note Patient Name: Douglas Mckay DOB: [birth year] MRN: [id number]  Date of Service  11/09/2013   HPI/Events of Note   SBO, Resp failure, shock, agitated on vent   eICU Interventions  Bolus saline now Gentle sedation May need cvl Check lactic acid   Intervention Category Major Interventions: Respiratory failure - evaluation and management;Hypotension - evaluation and management  Juanito Doom 11/09/2013, 8:57 PM

## 2013-11-09 NOTE — Progress Notes (Signed)
Utilization Review Complete  

## 2013-11-09 NOTE — Consult Note (Signed)
[facility] Medical Consultation  Douglas Mckay BZJ:[id number] DOB: 1932-01-27 DOA: 10/23/2013 PCP: Marjo Bicker, MD   Requesting physician: Dr. Arnoldo Morale Date of consultation: 11/09/13 Reason for consultation: respiratory failure  Impression/Recommendations Active Problems:   SBO (small bowel obstruction)   HTN (hypertension), benign   Acute respiratory failure with hypoxia   Acute encephalopathy   Aspiration pneumonia   Small bowel obstruction    1. Acute respiratory failure. With sudden onset of symptoms, ongoing bowel obstruction, and bilateral rhonchi on exam, strong suspicion for aspiration pneumonia. Due to his increased work of breathing and hypoxia despite BiPAP therapy, he'll need intubation for further management. We'll request pulmonology consultation to assist with management. Of note, patient had similar presentation in 07/2013, although it does not appear he needed intubation at that time 2. Aspiration pneumonia. Empirically start the patient on Zosyn 3. Acute encephalopathy. Possibly related to medications, since he did have an improvement with Narcan administration. Since he'll be intubated, we'll need to continue with sedation. We'll have to reassess once the patient is extubated.  4. Small bowel obstruction with right inguinal hernia. General surgery is following. Continue NG tube decompression. Possible operative management per general surgery  Patient will be transferred to the intensive. Further management. I have discussed the case with Dr. Arnoldo Morale. The patient will be transferred to the hospitalist service with general surgery to follow in consult.  Chief Complaint: bowel obstruction  HPI:  This is a 80 year old male who presents to the hospital with abdominal pain, distention, nausea and vomiting. Imaging indicated a small bowel obstruction with a right inguinal hernia. Patient was admitted to the hospital for further treatment. NG tube was placed for  decompression. Patient was reportedly doing fairly well this morning. He was awake, alert and oriented. He was breathing comfortably on room air. During the course of the day, patient suddenly became increasingly drowsy lethargic. He began to develop respiratory distress. Respiratory therapy was called and he was placed on the BiPAP mask. ABG was done which indicated significant hypoxia. He was noted to be tachycardic and hypertensive. Hospitalist consultation was requested for assistance with management. On my arrival, patient was minimally responsive to verbal/painful stimuli. He increased work of breathing on BiPAP. Patient will be transferred to the ICU for further care.  Review of Systems:  Unable to assess due to mental status  Past Medical History  Diagnosis Date  . Hypertension   . Hypercholesterolemia   . Cancer     prostate   Past Surgical History  Procedure Laterality Date  . Prostatectomy     Social History:  reports that he has quit smoking. He does not have any smokeless tobacco history on file. He reports that he does not drink alcohol or use illicit drugs.  Allergies  Allergen Reactions  . Solu-Medrol [Methylprednisolone Acetate] Other (See Comments)    Severe encephalopathy/delirium.   Family History  Problem Relation Age of Onset  . Stroke Mother     Prior to Admission medications   Medication Sig Start Date End Date Taking? Authorizing Provider  acetaminophen (TYLENOL) 325 MG tablet Take 2 tablets (650 mg total) by mouth every 6 (six) hours as needed for mild pain (or Fever >/= 101). 08/04/13  Yes Maryann Mikhail, DO  amLODipine-benazepril (LOTREL) 5-40 MG per capsule Take 1 capsule by mouth daily.   Yes Historical Provider, MD  carvedilol (COREG) 3.125 MG tablet Take 3.125 mg by mouth daily.   Yes Historical Provider, MD  simvastatin (ZOCOR) 20 MG tablet Take  20 mg by mouth every evening.   Yes Historical Provider, MD   Physical Exam: Blood pressure 147/110,  pulse 131, temperature 98.4 F (36.9 C), temperature source Oral, resp. rate 20, height 5\' 1"  (1.549 m), weight 53.7 kg (118 lb 6.2 oz), SpO2 97.00%. Filed Vitals:   11/09/13 1529  BP: 147/110  Pulse: 131  Temp: 98.4 F (36.9 C)  Resp:      General:  Does not respond to voice or painful stimuli, on bipap, increased work of breathing  Eyes: PERRL  ENT: mucous membranes are dry, NG tube in place draining brown colored liquid  Neck: supple  Cardiovascular: s1, s2, tachycardic  Respiratory: bilateral rhonchi, increased work of breathing  Abdomen: firm, nondistended, difficult to appreciate tenderness, BS sluggish  Skin: normal based on my limited exam  Musculoskeletal: no edema b/l  Psychiatric: unresponsive  Neurologic: unable to assess due to mental status  Labs on Admission:  Basic Metabolic Panel:  Recent Labs Lab 11/17/2013 2222  NA 148*  K 3.9  CL 105  CO2 28  GLUCOSE 204*  BUN 26*  CREATININE 1.19  CALCIUM 9.5   Liver Function Tests:  Recent Labs Lab 10/24/2013 2222  AST 22  ALT 16  ALKPHOS 65  BILITOT 0.4  PROT 7.5  ALBUMIN 4.3    Recent Labs Lab 10/29/2013 2222  LIPASE 22   No results found for this basename: AMMONIA,  in the last 168 hours CBC:  Recent Labs Lab 11/07/2013 2222  WBC 3.5*  NEUTROABS 3.1  HGB 12.4*  HCT 39.4  MCV 84.5  PLT 152   Cardiac Enzymes: No results found for this basename: CKTOTAL, CKMB, CKMBINDEX, TROPONINI,  in the last 168 hours BNP: No components found with this basename: POCBNP,  CBG: No results found for this basename: GLUCAP,  in the last 168 hours  Radiological Exams on Admission: Ct Abdomen Pelvis W Contrast  11/17/2013   CLINICAL DATA:  Epigastric pain, nausea and vomiting and mid back pain. History prostate cancer status post prostatectomy.  EXAM: CT ABDOMEN AND PELVIS WITH CONTRAST  TECHNIQUE: Multidetector CT imaging of the abdomen and pelvis was performed using the standard protocol following  bolus administration of intravenous contrast.  CONTRAST:  17mL OMNIPAQUE IOHEXOL 300 MG/ML SOLN, 166mL OMNIPAQUE IOHEXOL 300 MG/ML SOLN  COMPARISON:  CT of the abdomen and pelvis 07/25/2013.  FINDINGS: Lung Bases: Patulous fluid-filled distal esophagus. Atherosclerotic calcifications in the right coronary artery.  Abdomen/Pelvis: Large right inguinal hernia containing multiple loops of small bowel. Proximal to this there are multiple dilated loops of small bowel which measure up to 5.8 cm in diameter, with multiple air-fluid levels, compatible with bowel obstruction. Although the mucosa of some of these bowel loops appears to enhance normally, the mucosa of some other bowel loops fail to enhance, which could suggest some bowel ischemia. No definite pneumatosis is noted at this time. No portal venous gas. Small volume of ascites and interloop fluid. No pneumoperitoneum. A suture line is noted in the right side of the abdomen, presumably from prior partial small bowel resection. Some gas and stool is noted throughout the colon, indicating that this obstruction is likely new. Stomach is moderately distended.  There are multiple sub cm low-attenuation lesions scattered throughout the liver which are too small to characterize, but are favored to represent tiny cysts. Gallbladder is unremarkable in appearance. The appearance of the pancreas, spleen and bilateral adrenal glands is unremarkable. Numerous sub cm low-attenuation lesions scattered throughout the kidneys bilaterally  are too small to definitively characterize, but are favored to represent tiny cysts. Atherosclerosis throughout the abdominal and pelvic vasculature, without evidence of aneurysm or dissection. No definite lymphadenopathy identified within the abdomen or pelvis. Status post prostatectomy. Urinary bladder is unremarkable in appearance.  Musculoskeletal: There are no aggressive appearing lytic or blastic lesions noted in the visualized portions of the  skeleton.  IMPRESSION: 1. Right inguinal hernia with associated small bowel obstruction, and decreased enhancement of the mucosa of several dilated small bowel loops, which is suggestive of early changes of bowel ischemia. There is a small volume of ascites and interloop fluid, which is presumably reactive. No pneumoperitoneum to strongly suggest frank perforation at this time. Immediate surgical consultation is strongly recommended. 2. Additional incidental findings, as above. Critical Value/emergent results were called by telephone at the time of interpretation on 11/01/2013 at 11:43 PM to Dr. Tanna Furry, who verbally acknowledged these results.   Electronically Signed   By: Vinnie Langton M.D.   On: 10/24/2013 23:54    EKG: Independently reviewed. No acute findings on EKG from 5/19, will repeat today  Time spent: 61mins  Xian Apostol [facility] Pager [phone]  If 7PM-7AM, please contact night-coverage [email or web address] Password [facility] 11/09/2013, 4:51 PM

## 2013-11-09 NOTE — Plan of Care (Signed)
Report called to [facility], RN in ICU.  Room still being cleaned (MRSA).  Pt will be transferred as soon as it is done.

## 2013-11-09 NOTE — Plan of Care (Addendum)
Came in room and found pt breathing abnormally.  Vitals checked and recorded.  Pt has crackles and rough.  Resp co contacted.  Dr. Arnoldo Morale notified, [facility] consulted.  Blood Gases done - Pt placed on Bipap. Being transferred to [facility] for possible intubation.  0.4 Narcan given per orders.  Family in room and informed.

## 2013-11-09 NOTE — Anesthesia Procedure Notes (Signed)
Procedure Name: Intubation Date/Time: 11/09/2013 4:36 PM Performed by: Andree Elk, Thomas Mabry A Pre-anesthesia Checklist: Patient identified, Patient being monitored, Timeout performed, Emergency Drugs available and Suction available Patient Re-evaluated:Patient Re-evaluated prior to inductionOxygen Delivery Method: Ambu bag Preoxygenation: Pre-oxygenation with 100% oxygen Intubation Type: IV induction, Rapid sequence and Cricoid Pressure applied Laryngoscope Size: 3 and Miller Grade View: Grade I Tube type: Oral Tube size: 7.0 mm Number of attempts: 1 Airway Equipment and Method: stylet Placement Confirmation: ETT inserted through vocal cords under direct vision,  positive ETCO2 and breath sounds checked- equal and bilateral Secured at: 21 cm Tube secured with: Tape Dental Injury: Teeth and Oropharynx as per pre-operative assessment

## 2013-11-09 NOTE — Progress Notes (Signed)
Inpatient Diabetes Program Recommendations  AACE/ADA: New Consensus Statement on Inpatient Glycemic Control (2013)  Target Ranges:  Prepandial:   less than 140 mg/dL      Peak postprandial:   less than 180 mg/dL (1-2 hours)      Critically ill patients:  140 - 180 mg/dL   Results for URIEL, DOWDING (MRN [id number]) as of 11/09/2013 12:56  Ref. Range 11/07/2013 22:22  Glucose Latest Range: 70-99 mg/dL 204 (H)   Diabetes history: No Outpatient Diabetes medications: NA Current orders for Inpatient glycemic control: None  Inpatient Diabetes Program Recommendations Correction (SSI): Noted lab glucose of 204 mg/dl on 5/19. May want to consider ordering CBGs with Novolog sensitive correction scale. HgbA1C: Please consider ordering an A1C to evaluate glycemic control over the past 2-3 months.  Note: Patient does not have a documented history of diabetes. Noted lab glucose of 204 mg/dl on 5/19 at 22:22. Please consider ordering an A1C to evaluate glycemic control over the past 2-3 months and may want to consider ordering CBGs with Novolog sensitive correction scale.  Thanks, Barnie Alderman, RN, MSN, CCRN Diabetes Coordinator Inpatient Diabetes Program [phone] (Team Pager) [phone] ([facility] office) [phone] [facility] office)

## 2013-11-09 NOTE — Progress Notes (Signed)
RN called and stated that the Pt wasn't breathing right and could I come to check on hi. The Pt was neck very loud and I was concerned for his airway protection so I obatained an ABG and placed the Pt on BIPAP. This didn't help his breathing I strongly suggest4ed to the nurse that he needed to be intubated. I then saw Dr. Roderic Palau and he said to givve the pt narcan and the pt aroused some but RT was still concerned with his airway. DR. Roderic Palau said to intubate. Anesthesiologist intubated 8/21

## 2013-11-09 NOTE — H&P (Signed)
Douglas Mckay is an 80 y.o. male.   Chief Complaint: Small bowel obstruction HPI: Patient is a 80 year old black male who presented emergency room with worsening abdominal pain, distention, nausea, and vomiting. He states he has had a known right inguinal hernia for some time. He is somewhat a poor historian. CT scan the emergency room revealed a small bowel obstruction secondary to his right inguinal hernia. An NG tube is placed the patient was admitted to the hospital for further evaluation treatment.  Past Medical History  Diagnosis Date  . Hypertension   . Hypercholesterolemia   . Cancer     prostate    Past Surgical History  Procedure Laterality Date  . Prostatectomy      Family History  Problem Relation Age of Onset  . Stroke Mother    Social History:  reports that he has quit smoking. He does not have any smokeless tobacco history on file. He reports that he does not drink alcohol or use illicit drugs.  Allergies:  Allergies  Allergen Reactions  . Solu-Medrol [Methylprednisolone Acetate] Other (See Comments)    Severe encephalopathy/delirium.    Medications Prior to Admission  Medication Sig Dispense Refill  . acetaminophen (TYLENOL) 325 MG tablet Take 2 tablets (650 mg total) by mouth every 6 (six) hours as needed for mild pain (or Fever >/= 101).      Marland Kitchen amLODipine-benazepril (LOTREL) 5-40 MG per capsule Take 1 capsule by mouth daily.      . carvedilol (COREG) 3.125 MG tablet Take 3.125 mg by mouth daily.      . simvastatin (ZOCOR) 20 MG tablet Take 20 mg by mouth every evening.        Results for orders placed during the hospital encounter of 11/02/2013 (from the past 48 hour(s))  CBC WITH DIFFERENTIAL     Status: Abnormal   Collection Time    11/05/2013 10:22 PM      Result Value Ref Range   WBC 3.5 (*) 4.0 - 10.5 K/uL   RBC 4.66  4.22 - 5.81 MIL/uL   Hemoglobin 12.4 (*) 13.0 - 17.0 g/dL   HCT 39.4  39.0 - 52.0 %   MCV 84.5  78.0 - 100.0 fL   MCH 26.6  26.0 -  34.0 pg   MCHC 31.5  30.0 - 36.0 g/dL   RDW 14.1  11.5 - 15.5 %   Platelets 152  150 - 400 K/uL   Neutrophils Relative % 89 (*) 43 - 77 %   Neutro Abs 3.1  1.7 - 7.7 K/uL   Lymphocytes Relative 7 (*) 12 - 46 %   Lymphs Abs 0.2 (*) 0.7 - 4.0 K/uL   Monocytes Relative 4  3 - 12 %   Monocytes Absolute 0.2  0.1 - 1.0 K/uL   Eosinophils Relative 0  0 - 5 %   Eosinophils Absolute 0.0  0.0 - 0.7 K/uL   Basophils Relative 0  0 - 1 %   Basophils Absolute 0.0  0.0 - 0.1 K/uL  COMPREHENSIVE METABOLIC PANEL     Status: Abnormal   Collection Time    11/10/2013 10:22 PM      Result Value Ref Range   Sodium 148 (*) 137 - 147 mEq/L   Potassium 3.9  3.7 - 5.3 mEq/L   Chloride 105  96 - 112 mEq/L   CO2 28  19 - 32 mEq/L   Glucose, Bld 204 (*) 70 - 99 mg/dL   BUN 26 (*) 6 -  23 mg/dL   Creatinine, Ser 1.19  0.50 - 1.35 mg/dL   Calcium 9.5  8.4 - 10.5 mg/dL   Total Protein 7.5  6.0 - 8.3 g/dL   Albumin 4.3  3.5 - 5.2 g/dL   AST 22  0 - 37 U/L   ALT 16  0 - 53 U/L   Alkaline Phosphatase 65  39 - 117 U/L   Total Bilirubin 0.4  0.3 - 1.2 mg/dL   GFR calc non Af Amer 56 (*) >90 mL/min   GFR calc Af Amer 65 (*) >90 mL/min   Comment: (NOTE)     The eGFR has been calculated using the CKD EPI equation.     This calculation has not been validated in all clinical situations.     eGFR's persistently <90 mL/min signify possible Chronic Kidney     Disease.  LIPASE, BLOOD     Status: None   Collection Time    10/28/2013 10:22 PM      Result Value Ref Range   Lipase 22  11 - 59 U/L   Ct Abdomen Pelvis W Contrast  11/02/2013   CLINICAL DATA:  Epigastric pain, nausea and vomiting and mid back pain. History prostate cancer status post prostatectomy.  EXAM: CT ABDOMEN AND PELVIS WITH CONTRAST  TECHNIQUE: Multidetector CT imaging of the abdomen and pelvis was performed using the standard protocol following bolus administration of intravenous contrast.  CONTRAST:  71m OMNIPAQUE IOHEXOL 300 MG/ML SOLN, 1036m OMNIPAQUE IOHEXOL 300 MG/ML SOLN  COMPARISON:  CT of the abdomen and pelvis 07/25/2013.  FINDINGS: Lung Bases: Patulous fluid-filled distal esophagus. Atherosclerotic calcifications in the right coronary artery.  Abdomen/Pelvis: Large right inguinal hernia containing multiple loops of small bowel. Proximal to this there are multiple dilated loops of small bowel which measure up to 5.8 cm in diameter, with multiple air-fluid levels, compatible with bowel obstruction. Although the mucosa of some of these bowel loops appears to enhance normally, the mucosa of some other bowel loops fail to enhance, which could suggest some bowel ischemia. No definite pneumatosis is noted at this time. No portal venous gas. Small volume of ascites and interloop fluid. No pneumoperitoneum. A suture line is noted in the right side of the abdomen, presumably from prior partial small bowel resection. Some gas and stool is noted throughout the colon, indicating that this obstruction is likely new. Stomach is moderately distended.  There are multiple sub cm low-attenuation lesions scattered throughout the liver which are too small to characterize, but are favored to represent tiny cysts. Gallbladder is unremarkable in appearance. The appearance of the pancreas, spleen and bilateral adrenal glands is unremarkable. Numerous sub cm low-attenuation lesions scattered throughout the kidneys bilaterally are too small to definitively characterize, but are favored to represent tiny cysts. Atherosclerosis throughout the abdominal and pelvic vasculature, without evidence of aneurysm or dissection. No definite lymphadenopathy identified within the abdomen or pelvis. Status post prostatectomy. Urinary bladder is unremarkable in appearance.  Musculoskeletal: There are no aggressive appearing lytic or blastic lesions noted in the visualized portions of the skeleton.  IMPRESSION: 1. Right inguinal hernia with associated small bowel obstruction, and decreased  enhancement of the mucosa of several dilated small bowel loops, which is suggestive of early changes of bowel ischemia. There is a small volume of ascites and interloop fluid, which is presumably reactive. No pneumoperitoneum to strongly suggest frank perforation at this time. Immediate surgical consultation is strongly recommended. 2. Additional incidental findings, as above. Critical Value/emergent results  were called by telephone at the time of interpretation on 10/21/2013 at 11:43 PM to Dr. Tanna Furry, who verbally acknowledged these results.   Electronically Signed   By: Vinnie Langton M.D.   On: 11/20/2013 23:54    Review of Systems  Constitutional: Positive for malaise/fatigue.  HENT: Negative.   Eyes: Negative.   Respiratory: Negative.   Cardiovascular: Negative.   Gastrointestinal: Positive for nausea, vomiting and abdominal pain.  Genitourinary: Negative.   Musculoskeletal: Negative.   Skin: Negative.     Blood pressure 162/93, pulse 93, temperature 98.7 F (37.1 C), temperature source Oral, resp. rate 20, height 5' 1"  (1.549 m), weight 53.7 kg (118 lb 6.2 oz), SpO2 99.00%. Physical Exam  Constitutional: He is oriented to person, place, and time. He appears well-developed and well-nourished.  HENT:  Head: Normocephalic and atraumatic.  Neck: Normal range of motion. Neck supple.  Cardiovascular: Normal rate, regular rhythm and normal heart sounds.   Respiratory: Effort normal and breath sounds normal.  GI: Soft. Bowel sounds are normal. He exhibits distension. There is no tenderness. There is no rebound and no guarding.  Easily reducible right inguinal hernia noted.  Neurological: He is alert and oriented to person, place, and time.  Skin: Skin is warm and dry.     Assessment/Plan Impression: Small bowel obstruction secondary to right inguinal hernia, not incarcerated. Plan: Patient admitted the hospital for decompression of the small bowel. As his right inguinal hernia is  the cause of this, this will be repaired prior to discharge. The management plan has been discussed with the patient and wife, who agree.  [name] 11/09/2013, 10:45 AM

## 2013-11-09 NOTE — Plan of Care (Signed)
Narcan appeared to be causing pt to be more alert.  Movement occurred but pt not opening eyes or able to speak.  Pt intubated in room prior to transport.

## 2013-11-09 NOTE — Progress Notes (Addendum)
ANTIBIOTIC CONSULT NOTE - INITIAL  Pharmacy Consult for Zosyn & Vancomycin Indication: Aspiration Pneumonia  Allergies  Allergen Reactions  . Solu-Medrol [Methylprednisolone Acetate] Other (See Comments)    Severe encephalopathy/delirium.    Patient Measurements: Height: 5\' 1"  (154.9 cm) Weight: 118 lb 6.2 oz (53.7 kg) IBW/kg (Calculated) : 52.3 Adjusted Body Weight:    Vital Signs: Temp: 98.4 F (36.9 C) (05/20 1529) Temp src: Oral (05/20 1529) BP: 147/110 mmHg (05/20 1529) Pulse Rate: 131 (05/20 1529) Intake/Output from previous day: 05/19 0701 - 05/20 0700 In: -  Out: 200 [Urine:200] Intake/Output from this shift:    Labs:  Recent Labs  11-30-2013 2222  WBC 3.5*  HGB 12.4*  PLT 152  CREATININE 1.19   Estimated Creatinine Clearance: 37.2 ml/min (by C-G formula based on Cr of 1.19). No results found for this basename: VANCOTROUGH, VANCOPEAK, VANCORANDOM, GENTTROUGH, GENTPEAK, GENTRANDOM, TOBRATROUGH, TOBRAPEAK, TOBRARND, AMIKACINPEAK, AMIKACINTROU, AMIKACIN,  in the last 72 hours   Microbiology: No results found for this or any previous visit (from the past 720 hour(s)).  Medical History: Past Medical History  Diagnosis Date  . Hypertension   . Hypercholesterolemia   . Cancer     prostate    Medications:  Scheduled:  . albuterol  2.5 mg Nebulization Q4H  . [START ON 11/10/2013] antiseptic oral rinse  15 mL Mouth Rinse QID  . chlorhexidine  15 mL Mouth Rinse BID  . enalaprilat  1.25 mg Intravenous 4 times per day  . enoxaparin (LOVENOX) injection  40 mg Subcutaneous Q24H  . famotidine (PEPCID) IV  20 mg Intravenous Q12H  . ipratropium  0.5 mg Nebulization Q4H  . metoprolol  2.5 mg Intravenous 4 times per day  . piperacillin-tazobactam (ZOSYN)  IV  3.375 g Intravenous Q8H   Assessment: Zosyn & Vancomycin for aspiration pneumonia CrCl > 20 ml/min  Goal of Therapy:  Eradicate Infection  Plan: Vancomycin 750 mg IV every 24 hours Vancomycin trough  at steady state  Zosyn 3.375 GM IV every 8 hours Monitor renal function Labs per protocol  Sylver Vantassell [employer] 11/09/2013,4:38 PM

## 2013-11-09 NOTE — Procedures (Signed)
Central Venous Catheter Insertion Procedure Note Douglas Mckay [id number] Feb 25, 1932  Procedure: Insertion of Central Venous Catheter Indications: Assessment of intravascular volume, Drug and/or fluid administration and Frequent blood sampling  Procedure Details Consent: Risks of procedure as well as the alternatives and risks of each were explained to the (patient/caregiver).  Consent for procedure obtained. Time Out: Verified patient identification, verified procedure, site/side was marked, verified correct patient position, special equipment/implants available, medications/allergies/relevent history reviewed, required imaging and test results available.  Performed  Maximum sterile technique was used including antiseptics, cap, gloves, gown, hand hygiene, mask and sheet. Skin prep: Chlorhexidine; local anesthetic administered A antimicrobial bonded/coated triple lumen catheter was placed in the right subclavian vein using the Seldinger technique.  Evaluation Blood flow good Complications: No apparent complications Patient did tolerate procedure well. Chest X-ray ordered to verify placement.  CXR: pending.  Douglas Mckay 11/09/2013, 11:34 PM

## 2013-11-10 ENCOUNTER — Inpatient Hospital Stay (HOSPITAL_COMMUNITY): Payer: Medicare Other

## 2013-11-10 DIAGNOSIS — R6520 Severe sepsis without septic shock: Secondary | ICD-10-CM

## 2013-11-10 DIAGNOSIS — R6521 Severe sepsis with septic shock: Secondary | ICD-10-CM

## 2013-11-10 DIAGNOSIS — A419 Sepsis, unspecified organism: Secondary | ICD-10-CM | POA: Diagnosis not present

## 2013-11-10 LAB — BLOOD GAS, ARTERIAL
ACID-BASE DEFICIT: 4.6 mmol/L — AB (ref 0.0–2.0)
Bicarbonate: 20 mEq/L (ref 20.0–24.0)
Drawn by: 38235
FIO2: 0.85 %
LHR: 15 {breaths}/min
MECHVT: 500 mL
O2 SAT: 96.6 %
PEEP: 5 cmH2O
PO2 ART: 89.4 mmHg (ref 80.0–100.0)
Patient temperature: 37
TCO2: 18.7 mmol/L (ref 0–100)
pCO2 arterial: 37.5 mmHg (ref 35.0–45.0)
pH, Arterial: 7.348 — ABNORMAL LOW (ref 7.350–7.450)

## 2013-11-10 LAB — BASIC METABOLIC PANEL
BUN: 22 mg/dL (ref 6–23)
CALCIUM: 7.1 mg/dL — AB (ref 8.4–10.5)
CHLORIDE: 112 meq/L (ref 96–112)
CO2: 21 meq/L (ref 19–32)
Creatinine, Ser: 1.2 mg/dL (ref 0.50–1.35)
GFR calc Af Amer: 65 mL/min — ABNORMAL LOW (ref >90)
GFR calc non Af Amer: 56 mL/min — ABNORMAL LOW (ref >90)
GLUCOSE: 100 mg/dL — AB (ref 70–99)
Potassium: 3.6 mEq/L — ABNORMAL LOW (ref 3.7–5.3)
Sodium: 147 mEq/L (ref 137–147)

## 2013-11-10 LAB — CBC
HCT: 31.6 % — ABNORMAL LOW (ref 39.0–52.0)
HEMOGLOBIN: 10 g/dL — AB (ref 13.0–17.0)
MCH: 27.2 pg (ref 26.0–34.0)
MCHC: 31.6 g/dL (ref 30.0–36.0)
MCV: 85.9 fL (ref 78.0–100.0)
Platelets: 108 10*3/uL — ABNORMAL LOW (ref 150–400)
RBC: 3.68 MIL/uL — AB (ref 4.22–5.81)
RDW: 13.9 % (ref 11.5–15.5)
WBC: 3.9 10*3/uL — AB (ref 4.0–10.5)

## 2013-11-10 LAB — TYPE AND SCREEN
ABO/RH(D): O POS
ANTIBODY SCREEN: NEGATIVE

## 2013-11-10 LAB — CARBOXYHEMOGLOBIN
Carboxyhemoglobin: 1 % (ref 0.5–1.5)
Methemoglobin: 1.3 % (ref 0.0–1.5)
O2 Saturation: 66.1 %
Total hemoglobin: 10.5 g/dL — ABNORMAL LOW (ref 13.5–18.0)
Total oxygen content: 9.5 mL/dL — ABNORMAL LOW (ref 15.0–23.0)

## 2013-11-10 LAB — PROTIME-INR
INR: 1.54 — ABNORMAL HIGH (ref 0.00–1.49)
PROTHROMBIN TIME: 18.1 s — AB (ref 11.6–15.2)

## 2013-11-10 LAB — APTT: aPTT: 45 seconds — ABNORMAL HIGH (ref 24–37)

## 2013-11-10 LAB — MRSA PCR SCREENING: MRSA by PCR: NEGATIVE

## 2013-11-10 LAB — FIBRINOGEN: FIBRINOGEN: 290 mg/dL (ref 204–475)

## 2013-11-10 LAB — CORTISOL: Cortisol, Plasma: 36.5 ug/dL

## 2013-11-10 MED ORDER — SODIUM CHLORIDE 0.9 % IV BOLUS (SEPSIS): 750.0000 mL | Freq: Once | INTRAVENOUS | Status: AC

## 2013-11-10 MED ORDER — PHENYLEPHRINE HCL 10 MG/ML IJ SOLN
30.0000 ug/min | INTRAVENOUS | Status: DC
  Administered 2013-11-10: 50 ug/min via INTRAVENOUS
  Administered 2013-11-10 – 2013-11-16 (×3): 30 ug/min via INTRAVENOUS
  Filled 2013-11-10 (×6): qty 4

## 2013-11-10 MED ORDER — FENTANYL CITRATE 0.05 MG/ML IJ SOLN
50.0000 ug | Freq: Once | INTRAMUSCULAR | Status: AC
  Administered 2013-11-10: 50 ug via INTRAVENOUS

## 2013-11-10 MED ORDER — SODIUM CHLORIDE 0.9 % IV BOLUS (SEPSIS)
750.0000 mL | Freq: Once | INTRAVENOUS | Status: AC
  Administered 2013-11-10: 750 mL via INTRAVENOUS

## 2013-11-10 MED ORDER — FENTANYL CITRATE 0.05 MG/ML IJ SOLN
100.0000 ug | Freq: Once | INTRAMUSCULAR | Status: AC
  Administered 2013-11-09: 100 ug via INTRAVENOUS

## 2013-11-10 MED ORDER — POTASSIUM CHLORIDE 10 MEQ/100ML IV SOLN
10.0000 meq | INTRAVENOUS | Status: AC
  Administered 2013-11-10 (×6): 10 meq via INTRAVENOUS
  Filled 2013-11-10 (×6): qty 100

## 2013-11-10 MED ORDER — FENTANYL CITRATE 0.05 MG/ML IJ SOLN
INTRAMUSCULAR | Status: AC
  Filled 2013-11-10: qty 50

## 2013-11-10 MED ORDER — PHENYLEPHRINE HCL 10 MG/ML IJ SOLN
INTRAMUSCULAR | Status: AC
  Filled 2013-11-10: qty 4

## 2013-11-10 MED ORDER — FENTANYL BOLUS VIA INFUSION
25.0000 ug | INTRAVENOUS | Status: DC | PRN
  Administered 2013-11-11 – 2013-11-14 (×9): 25 ug via INTRAVENOUS
  Filled 2013-11-10: qty 50

## 2013-11-10 MED ORDER — SODIUM CHLORIDE 0.9 % IV BOLUS (SEPSIS)
500.0000 mL | Freq: Once | INTRAVENOUS | Status: AC
  Administered 2013-11-10: 500 mL via INTRAVENOUS

## 2013-11-10 MED ORDER — FENTANYL CITRATE 0.05 MG/ML IJ SOLN
0.0000 ug/h | INTRAMUSCULAR | Status: DC
  Administered 2013-11-10: 50 ug/h via INTRAVENOUS
  Administered 2013-11-10: 60 ug/h via INTRAVENOUS
  Administered 2013-11-11: 85 ug/h via INTRAVENOUS
  Administered 2013-11-13 (×2): 200 ug/h via INTRAVENOUS
  Administered 2013-11-14: 125 ug/h via INTRAVENOUS
  Administered 2013-11-15 – 2013-11-16 (×3): 150 ug/h via INTRAVENOUS
  Administered 2013-11-18: 100 ug/h via INTRAVENOUS
  Filled 2013-11-10 (×9): qty 50

## 2013-11-10 NOTE — Progress Notes (Signed)
No need for acute surgical intervention at this time. We'll follow with you.

## 2013-11-10 NOTE — Progress Notes (Signed)
[facility] Progress Note Patient Name: Douglas Mckay DOB: Jun 17, 1932 MRN: [id number]  Date of Service  11/10/2013   HPI/Events of Note   svo2 64, cvp 10 on vent  eICU Interventions  Bolus gagain, rpeeat svo2 , goal cvp 12   Intervention Category Major Interventions: Sepsis - evaluation and management  Raylene Miyamoto 11/10/2013, 1:21 AM

## 2013-11-10 NOTE — Consult Note (Addendum)
Consult requested by: [facility] hospitalist Consult requested for respiratory failure:  HPI: This is a 80 year old who was admitted to the hospital with small bowel obstruction. He has a previous similar admission about 3 months ago. At that time he aspirated and developed respiratory failure and that appears to have happened again. He developed sudden shortness of breath cough congestion increased work of breathing and did not respond to BiPAP so he was intubated and placed on mechanical ventilation. He remains intubated sedated and on mechanical relation now. His oxygenation is improving and he is on 60% oxygen now. His last chest x-ray from last admission did not show complete resolution of the changes on his x-ray and I don't know if he's had a followup x-ray since then. He has been "shocky" and has required greater than 5 L of IV fluids and is on Neo-Synephrine. His blood pressure is okay and his CVP has come up to approximately 10  Past Medical History  Diagnosis Date  . Hypertension   . Hypercholesterolemia   . Cancer     prostate     Family History  Problem Relation Age of Onset  . Stroke Mother      History   Social History  . Marital Status: Married    Spouse Name: N/A    Number of Children: N/A  . Years of Education: N/A   Social History Main Topics  . Smoking status: Former Research scientist (life sciences)  . Smokeless tobacco: None  . Alcohol Use: No  . Drug Use: No  . Sexual Activity: None   Other Topics Concern  . None   Social History Narrative  . None     ROS: Unobtainable    Objective: Vital signs in last 24 hours: Temp:  [98.4 F (36.9 C)-100.4 F (38 C)] 100.4 F (38 C) (05/21 0315) Pulse Rate:  [110-131] 114 (05/21 0600) Resp:  [14-23] 14 (05/21 0600) BP: (76-147)/(49-110) 79/56 mmHg (05/21 0600) SpO2:  [84 %-100 %] 97 % (05/21 0655) FiO2 (%):  [60 %-100 %] 60 % (05/21 0655) Weight:  [55 kg (121 lb 4.1 oz)] 55 kg (121 lb 4.1 oz) (05/21 0600) Weight change: -6.236 kg  (-13 lb 12 oz) Last BM Date: 11/04/13  Intake/Output from previous day: 05/20 0701 - 05/21 0700 In: -  Out: 950 [Urine:950]  PHYSICAL EXAM He is intubated sedated on mechanical ventilation and looks pretty comfortable. His pupils react. His mucous membranes are moist. His neck is supple. His chest shows rhonchi and rales bilaterally. His heart is regular without gallop. His abdomen still somewhat distended nontender. Extremities showed no edema central nervous system examination is not really obtainable because of his sedation  Lab Results: Basic Metabolic Panel:  Recent Labs  11/09/13 2247 11/10/13 0443  NA 146 147  K 4.0 3.6*  CL 110 112  CO2 23 21  GLUCOSE 107* 100*  BUN 23 22  CREATININE 1.28 1.20  CALCIUM 7.6* 7.1*   Liver Function Tests:  Recent Labs  11/05/2013 2222  AST 22  ALT 16  ALKPHOS 65  BILITOT 0.4  PROT 7.5  ALBUMIN 4.3    Recent Labs  11/07/2013 2222  LIPASE 22   No results found for this basename: AMMONIA,  in the last 72 hours CBC:  Recent Labs  10/22/2013 2222 11/09/13 2247 11/10/13 0443  WBC 3.5* 2.0* 3.9*  NEUTROABS 3.1  --   --   HGB 12.4* 11.0* 10.0*  HCT 39.4 34.5* 31.6*  MCV 84.5 84.4 85.9  PLT  152 107* 108*   Cardiac Enzymes: No results found for this basename: CKTOTAL, CKMB, CKMBINDEX, TROPONINI,  in the last 72 hours BNP:  Recent Labs  11/09/13 2247  PROBNP 1241.0*   D-Dimer: No results found for this basename: DDIMER,  in the last 72 hours CBG: No results found for this basename: GLUCAP,  in the last 72 hours Hemoglobin A1C: No results found for this basename: HGBA1C,  in the last 72 hours Fasting Lipid Panel: No results found for this basename: CHOL, HDL, LDLCALC, TRIG, CHOLHDL, LDLDIRECT,  in the last 72 hours Thyroid Function Tests: No results found for this basename: TSH, T4TOTAL, FREET4, T3FREE, THYROIDAB,  in the last 72 hours Anemia Panel: No results found for this basename: VITAMINB12, FOLATE, FERRITIN,  TIBC, IRON, RETICCTPCT,  in the last 72 hours Coagulation:  Recent Labs  11/09/13 2351  LABPROT 18.1*  INR 1.54*   Urine Drug Screen: Drugs of Abuse  No results found for this basename: labopia, cocainscrnur, labbenz, amphetmu, thcu, labbarb    Alcohol Level: No results found for this basename: ETH,  in the last 72 hours Urinalysis: No results found for this basename: COLORURINE, APPERANCEUR, LABSPEC, PHURINE, GLUCOSEU, HGBUR, BILIRUBINUR, KETONESUR, PROTEINUR, UROBILINOGEN, NITRITE, LEUKOCYTESUR,  in the last 72 hours Misc. Labs:   ABGS:  Recent Labs  11/10/13 0500  PHART 7.348*  PO2ART 89.4  TCO2 18.7  HCO3 20.0     MICROBIOLOGY: Recent Results (from the past 240 hour(s))  MRSA PCR SCREENING     Status: None   Collection Time    11/09/13  6:15 PM      Result Value Ref Range Status   MRSA by PCR NEGATIVE  NEGATIVE Final   Comment:            The GeneXpert MRSA Assay (FDA     approved for NASAL specimens     only), is one component of a     comprehensive MRSA colonization     surveillance program. It is not     intended to diagnose MRSA     infection nor to guide or     monitor treatment for     MRSA infections.  CULTURE, BLOOD (ROUTINE X 2)     Status: None   Collection Time    11/09/13 10:47 PM      Result Value Ref Range Status   Specimen Description BLOOD LEFT WRIST   Final   Special Requests     Final   Value: BOTTLES DRAWN AEROBIC AND ANAEROBIC AEB 6CC ANA 4CC   Culture PENDING   Incomplete   Report Status PENDING   Incomplete  CULTURE, BLOOD (ROUTINE X 2)     Status: None   Collection Time    11/09/13 10:47 PM      Result Value Ref Range Status   Specimen Description BLOOD CENTRAL LINE   Final   Special Requests BOTTLES DRAWN AEROBIC AND ANAEROBIC 8CC EACH   Final   Culture PENDING   Incomplete   Report Status PENDING   Incomplete    Studies/Results: Ct Abdomen Pelvis W Contrast  Dec 03, 2013   CLINICAL DATA:  Epigastric pain, nausea and  vomiting and mid back pain. History prostate cancer status post prostatectomy.  EXAM: CT ABDOMEN AND PELVIS WITH CONTRAST  TECHNIQUE: Multidetector CT imaging of the abdomen and pelvis was performed using the standard protocol following bolus administration of intravenous contrast.  CONTRAST:  58mL OMNIPAQUE IOHEXOL 300 MG/ML SOLN, 158mL OMNIPAQUE IOHEXOL 300 MG/ML  SOLN  COMPARISON:  CT of the abdomen and pelvis 07/25/2013.  FINDINGS: Lung Bases: Patulous fluid-filled distal esophagus. Atherosclerotic calcifications in the right coronary artery.  Abdomen/Pelvis: Large right inguinal hernia containing multiple loops of small bowel. Proximal to this there are multiple dilated loops of small bowel which measure up to 5.8 cm in diameter, with multiple air-fluid levels, compatible with bowel obstruction. Although the mucosa of some of these bowel loops appears to enhance normally, the mucosa of some other bowel loops fail to enhance, which could suggest some bowel ischemia. No definite pneumatosis is noted at this time. No portal venous gas. Small volume of ascites and interloop fluid. No pneumoperitoneum. A suture line is noted in the right side of the abdomen, presumably from prior partial small bowel resection. Some gas and stool is noted throughout the colon, indicating that this obstruction is likely new. Stomach is moderately distended.  There are multiple sub cm low-attenuation lesions scattered throughout the liver which are too small to characterize, but are favored to represent tiny cysts. Gallbladder is unremarkable in appearance. The appearance of the pancreas, spleen and bilateral adrenal glands is unremarkable. Numerous sub cm low-attenuation lesions scattered throughout the kidneys bilaterally are too small to definitively characterize, but are favored to represent tiny cysts. Atherosclerosis throughout the abdominal and pelvic vasculature, without evidence of aneurysm or dissection. No definite  lymphadenopathy identified within the abdomen or pelvis. Status post prostatectomy. Urinary bladder is unremarkable in appearance.  Musculoskeletal: There are no aggressive appearing lytic or blastic lesions noted in the visualized portions of the skeleton.  IMPRESSION: 1. Right inguinal hernia with associated small bowel obstruction, and decreased enhancement of the mucosa of several dilated small bowel loops, which is suggestive of early changes of bowel ischemia. There is a small volume of ascites and interloop fluid, which is presumably reactive. No pneumoperitoneum to strongly suggest frank perforation at this time. Immediate surgical consultation is strongly recommended. 2. Additional incidental findings, as above. Critical Value/emergent results were called by telephone at the time of interpretation on 11-16-2013 at 11:43 PM to Dr. Tanna Furry, who verbally acknowledged these results.   Electronically Signed   By: Vinnie Langton M.D.   On: 11/16/13 23:54   Portable Chest Xray  11/09/2013   CLINICAL DATA:  Endotracheal tube placement  EXAM: PORTABLE CHEST - 1 VIEW  COMPARISON:  DG CHEST 1V PORT dated 07/29/2013  FINDINGS: There is an endotracheal tube with the tip 2.5 cm above the carina. There is a nasogastric to with the tip coiled in the left upper quadrant likely within the stomach. There are bilateral interstitial and alveolar airspace opacities, right greater than left. There is no pneumothorax. Normal cardiomediastinal silhouette. Unremarkable osseous structures.  IMPRESSION: Bilateral interstitial and alveolar airspace opacities, right greater than left. This may reflect atypical pulmonary edema versus multi lobar pneumonia.   Electronically Signed   By: Kathreen Devoid   On: 11/09/2013 17:01   Dg Chest Port 1v Same Day  11/10/2013   CLINICAL DATA:  Right central line placement.  EXAM: PORTABLE CHEST - 1 VIEW SAME DAY  COMPARISON:  DG CHEST 1V PORT dated 11/09/2013 at 1649 hr  FINDINGS: Interval  placement of right subclavian central venous catheter with distal tip projecting in mid superior vena cava. No pneumothorax. Endotracheal tube tip projects 5.1 cm above the carina. Nasogastric tube looped in the stomach, distal tip projecting in proximal stomach. Multiple EKG lines overlie the patient and may obscure subtle underlying pathology.  Increasing  alveolar airspace opacities with mild interstitial prominence. Cardiomediastinal silhouette is nonsuspicious, mildly calcified aortic knob. No pleural effusions.  IMPRESSION: Interval placement of right subclavian central venous catheter with distal tip projecting in mid superior vena cava. No pneumothorax. No apparent change in remaining life support lines.  Worsening alveolar airspace opacities concerning for multifocal pneumonia, less likely focal edema in a background of mild interstitial prominence   Electronically Signed   By: Elon Alas   On: 11/10/2013 00:13    Medications:  Prior to Admission:  Prescriptions prior to admission  Medication Sig Dispense Refill  . acetaminophen (TYLENOL) 325 MG tablet Take 2 tablets (650 mg total) by mouth every 6 (six) hours as needed for mild pain (or Fever >/= 101).      Marland Kitchen amLODipine-benazepril (LOTREL) 5-40 MG per capsule Take 1 capsule by mouth daily.      . carvedilol (COREG) 3.125 MG tablet Take 3.125 mg by mouth daily.      . simvastatin (ZOCOR) 20 MG tablet Take 20 mg by mouth every evening.       Scheduled: . antiseptic oral rinse  15 mL Mouth Rinse QID  . chlorhexidine  15 mL Mouth Rinse BID  . enoxaparin (LOVENOX) injection  40 mg Subcutaneous Q24H  . famotidine (PEPCID) IV  20 mg Intravenous Q12H  . ipratropium-albuterol  3 mL Nebulization Q4H  . piperacillin-tazobactam (ZOSYN)  IV  3.375 g Intravenous Q8H  . sodium chloride  10-40 mL Intracatheter Q12H  . vancomycin  750 mg Intravenous Q24H   Continuous: . sodium chloride 100 mL/hr at 11/09/13 2219  . fentaNYL infusion  INTRAVENOUS 60 mcg/hr (11/10/13 0603)  . phenylephrine (NEO-SYNEPHRINE) Adult infusion 50 mcg/min (11/10/13 0603)   LNL:[id number], acetaminophen, albuterol, fentaNYL, fentaNYL, fentaNYL, LORazepam, naLOXone (NARCAN)  injection, ondansetron, sodium chloride, sodium chloride  Assesment: He has acute hypoxic respiratory failure that I think is on the basis of aspiration pneumonia. He has small bowel obstruction. He has apparently aspirated in the past. He has acute encephalopathy  He appears to be septic. He is being treated with fluid resuscitation and with pressors. His renal function has held approximately steady so far   Active Problems:   SBO (small bowel obstruction)   HTN (hypertension), benign   Acute respiratory failure with hypoxia   Acute encephalopathy   Aspiration pneumonia   Small bowel obstruction    Plan: We don't have any opportunity for getting him off the ventilator at this point. He is still on 60% oxygen. He is still on pressors His chest x-ray looks significantly worse than previous    LOS: 2 days   Alonza Bogus 11/10/2013, 7:16 AM

## 2013-11-10 NOTE — Progress Notes (Signed)
UR chart review completed.  

## 2013-11-10 NOTE — Progress Notes (Signed)
INITIAL NUTRITION ASSESSMENT  DOCUMENTATION CODES Per approved criteria  -Not Applicable   INTERVENTION: If unable to wean within 48 hours of intubation, initiate nutrition support measures: recommend TPN due to SBO.  If SBO resolves or if pt is able to tolerate enteral feeding at time of initiation, recommend start Oxepa @ 20 ml/hr and advance 10 ml/hr every 4 hours to goal rate of 40 ml/hr. Regimen at goal rate will provide 1440 kcals, 60 grams protein, and 754 ml fluid daily. This will meet 95% of estimated energy needs and 88% of estimated protein needs.   NUTRITION DIAGNOSIS: Inadequate oral intake related to inability to eat as evidenced by NPO, mechanical ventilation.   Goal: Pt will meet >90% of estimated nutritional needs If unable to wean within 48 hours of intubation, initiate nutrition support measures  Monitor:  Respiratory status, initiation/tolerance of nutrition support measures, weight changes, labs, skin assessments, I/O's  Reason for Assessment: Mechanical ventilation  80 y.o. male  Admitting Dx: Septic shock  ASSESSMENT: Patient is currently intubated on ventilator support MV: 9.4 L/min Temp (24hrs), Avg:99.3 F (37.4 C), Min:98.3 F (36.8 C), Max:100.4 F (38 C)  Propofol: n/a Pt admitted for SBO. Currently with ng tube for decompression. Pt intubated yesterday due to elevated ABG and respiratory failure. MD suspects possible aspiration pneumonia. Spoke with RN, who anticipate prolonged vent use. RT also reported that CXR looks significantly worse this AM compared to yesterday.  Wt hx reveals a a 6.6% wt loss x 9 months (which is not clinically significant) and a 10% wt gain x 3 months, which is clinically significant. Wt gain favorable, and likely due to improved nutritional status.  Limited exam done, due to multiple health care providers in room and pt unable to follow commands.  Nutrition Focused Physical Exam:  Subcutaneous Fat:  Orbital Region:  WDL Upper Arm Region: unable to assess Thoracic and Lumbar Region: unable to assess  Muscle:  Temple Region: WDL Clavicle Bone Region: WDL Clavicle and Acromion Bone Region: WDL Scapular Bone Region: unable to assess Dorsal Hand: unable to assess Patellar Region: unable to assess Anterior Thigh Region: unable to assess Posterior Calf Region: unable to assess  Edema: unable to assess  Height: Ht Readings from Last 1 Encounters:  11/09/13 5\' 1"  (1.549 m)    Weight: Wt Readings from Last 1 Encounters:  11/10/13 121 lb 4.1 oz (55 kg)    Ideal Body Weight: 112#  % Ideal Body Weight: 108%  Wt Readings from Last 10 Encounters:  11/10/13 121 lb 4.1 oz (55 kg)  08/04/13 110 lb 0.2 oz (49.9 kg)  02/19/13 129 lb (58.514 kg)    Usual Body Weight: 129#  % Usual Body Weight: 87%  BMI:  Body mass index is 22.92 kg/(m^2). Normal range.   Estimated Nutritional Needs: Kcal: 1513.9 daily Protein: 66-83 grams daily Fluid: 1.6-1.8 L daily  Skin: WDL  Diet Order: NPO  EDUCATION NEEDS: -Education not appropriate at this time   Intake/Output Summary (Last 24 hours) at 11/10/13 1154 Last data filed at 11/10/13 0603  Gross per 24 hour  Intake      0 ml  Output   1150 ml  Net  -1150 ml    Last BM: 11/05/13   Labs:   Recent Labs Lab 2013/12/07 2222 11/09/13 2247 11/10/13 0443  NA 148* 146 147  K 3.9 4.0 3.6*  CL 105 110 112  CO2 28 23 21   BUN 26* 23 22  CREATININE 1.19 1.28 1.20  CALCIUM 9.5 7.6* 7.1*  GLUCOSE 204* 107* 100*    CBG (last 3)  No results found for this basename: GLUCAP,  in the last 72 hours  Scheduled Meds: . antiseptic oral rinse  15 mL Mouth Rinse QID  . chlorhexidine  15 mL Mouth Rinse BID  . enoxaparin (LOVENOX) injection  40 mg Subcutaneous Q24H  . famotidine (PEPCID) IV  20 mg Intravenous Q12H  . ipratropium-albuterol  3 mL Nebulization Q4H  . piperacillin-tazobactam (ZOSYN)  IV  3.375 g Intravenous Q8H  . potassium chloride  10 mEq  Intravenous Q1 Hr x 6  . sodium chloride  10-40 mL Intracatheter Q12H  . vancomycin  750 mg Intravenous Q24H    Continuous Infusions: . sodium chloride 100 mL/hr at 11/09/13 2219  . fentaNYL infusion INTRAVENOUS 60 mcg/hr (11/10/13 0603)  . phenylephrine (NEO-SYNEPHRINE) Adult infusion 30 mcg/min (11/10/13 1106)    Past Medical History  Diagnosis Date  . Hypertension   . Hypercholesterolemia   . Cancer     prostate    Past Surgical History  Procedure Laterality Date  . Prostatectomy      Jonatha Gagen A. Jimmye Norman, RD, LDN Pager: [phone]

## 2013-11-10 NOTE — Progress Notes (Signed)
[facility] Progress Note Patient Name: Douglas Mckay DOB: 01/04/1932 MRN: [id number]  Date of Service  11/10/2013   HPI/Events of Note   Pain, vent dyschrony  eICU Interventions  fent   Intervention Category Intermediate Interventions: Respiratory distress - evaluation and management  Raylene Miyamoto 11/10/2013, 1:39 AM

## 2013-11-10 NOTE — Progress Notes (Signed)
[facility] PROGRESS NOTE  Douglas Mckay UDJ:[id number] DOB: 1932/03/21 DOA: 11/07/2013 PCP: Marjo Bicker, MD  Assessment/Plan: 1. Septic shock. Likely related to aspiration pneumonia. Patient has been receiving aggressive hydration and is requiring vasopressors to maintain blood pressure. He is on broad-spectrum antibiotics. Blood cultures are pending. Continue current treatments. 2. Aspiration pneumonia. Likely related to bowel obstruction. He is on broad-spectrum antibiotics. Continue current treatments 3. Acute respiratory failure, likely due to pneumonia. Patient required intubation on 5/20 when he failed bipap treatment. Pulmonology following. Will try and wean from vent as tolerated. 4. Small bowel obstruction with right inguinal hernia. Surgery is following.  Continuing NG tube decompression. 5. Acute encephalopathy. Likely related to acute illness.  Will re evaluate once patient is able to extubate.   Code Status: full code Family Communication: discussed with wife at the bedside Disposition Plan: home vs. Snf, pending hospital course   Consultants:  Gen Surgery  Pulmonology  Procedures:  Intubation 5/20>>  Central line placement 5/20  Antibiotics:  Vancomycin 5/20>>  Zosyn 5/20>>  HPI/Subjective: Sedated and intubated  Objective: Filed Vitals:   11/10/13 0932  BP:   Pulse:   Temp: 98.3 F (36.8 C)  Resp:     Intake/Output Summary (Last 24 hours) at 11/10/13 1008 Last data filed at 11/10/13 0603  Gross per 24 hour  Intake      0 ml  Output   1150 ml  Net  -1150 ml   Filed Weights   11/09/13 0205 11/09/13 0557 11/10/13 0600  Weight: 61.236 kg (135 lb) 53.7 kg (118 lb 6.2 oz) 55 kg (121 lb 4.1 oz)    Exam:   General:  Sedated, intubated on vent  Cardiovascular: S1, S2 RRR  Respiratory: crackles at bases  Abdomen: soft, nt, nd, bs+  Musculoskeletal:  No edema b/l   Data Reviewed: Basic Metabolic Panel:  Recent Labs Lab  11/10/2013 2222 11/09/13 2247 11/10/13 0443  NA 148* 146 147  K 3.9 4.0 3.6*  CL 105 110 112  CO2 28 23 21   GLUCOSE 204* 107* 100*  BUN 26* 23 22  CREATININE 1.19 1.28 1.20  CALCIUM 9.5 7.6* 7.1*   Liver Function Tests:  Recent Labs Lab 11/15/2013 2222  AST 22  ALT 16  ALKPHOS 65  BILITOT 0.4  PROT 7.5  ALBUMIN 4.3    Recent Labs Lab 10/27/2013 2222  LIPASE 22   No results found for this basename: AMMONIA,  in the last 168 hours CBC:  Recent Labs Lab 10/31/2013 2222 11/09/13 2247 11/10/13 0443  WBC 3.5* 2.0* 3.9*  NEUTROABS 3.1  --   --   HGB 12.4* 11.0* 10.0*  HCT 39.4 34.5* 31.6*  MCV 84.5 84.4 85.9  PLT 152 107* 108*   Cardiac Enzymes: No results found for this basename: CKTOTAL, CKMB, CKMBINDEX, TROPONINI,  in the last 168 hours BNP (last 3 results)  Recent Labs  11/09/13 2247  PROBNP 1241.0*   CBG: No results found for this basename: GLUCAP,  in the last 168 hours  Recent Results (from the past 240 hour(s))  MRSA PCR SCREENING     Status: None   Collection Time    11/09/13  6:15 PM      Result Value Ref Range Status   MRSA by PCR NEGATIVE  NEGATIVE Final   Comment:            The GeneXpert MRSA Assay (FDA     approved for NASAL specimens     only), is  one component of a     comprehensive MRSA colonization     surveillance program. It is not     intended to diagnose MRSA     infection nor to guide or     monitor treatment for     MRSA infections.  CULTURE, BLOOD (ROUTINE X 2)     Status: None   Collection Time    11/09/13 10:47 PM      Result Value Ref Range Status   Specimen Description BLOOD LEFT WRIST   Final   Special Requests     Final   Value: BOTTLES DRAWN AEROBIC AND ANAEROBIC AEB 6CC ANA 4CC   Culture PENDING   Incomplete   Report Status PENDING   Incomplete  CULTURE, BLOOD (ROUTINE X 2)     Status: None   Collection Time    11/09/13 10:47 PM      Result Value Ref Range Status   Specimen Description BLOOD CENTRAL LINE   Final    Special Requests BOTTLES DRAWN AEROBIC AND ANAEROBIC 8CC EACH   Final   Culture PENDING   Incomplete   Report Status PENDING   Incomplete     Studies: Ct Abdomen Pelvis W Contrast  Nov 16, 2013   CLINICAL DATA:  Epigastric pain, nausea and vomiting and mid back pain. History prostate cancer status post prostatectomy.  EXAM: CT ABDOMEN AND PELVIS WITH CONTRAST  TECHNIQUE: Multidetector CT imaging of the abdomen and pelvis was performed using the standard protocol following bolus administration of intravenous contrast.  CONTRAST:  21mL OMNIPAQUE IOHEXOL 300 MG/ML SOLN, 162mL OMNIPAQUE IOHEXOL 300 MG/ML SOLN  COMPARISON:  CT of the abdomen and pelvis 07/25/2013.  FINDINGS: Lung Bases: Patulous fluid-filled distal esophagus. Atherosclerotic calcifications in the right coronary artery.  Abdomen/Pelvis: Large right inguinal hernia containing multiple loops of small bowel. Proximal to this there are multiple dilated loops of small bowel which measure up to 5.8 cm in diameter, with multiple air-fluid levels, compatible with bowel obstruction. Although the mucosa of some of these bowel loops appears to enhance normally, the mucosa of some other bowel loops fail to enhance, which could suggest some bowel ischemia. No definite pneumatosis is noted at this time. No portal venous gas. Small volume of ascites and interloop fluid. No pneumoperitoneum. A suture line is noted in the right side of the abdomen, presumably from prior partial small bowel resection. Some gas and stool is noted throughout the colon, indicating that this obstruction is likely new. Stomach is moderately distended.  There are multiple sub cm low-attenuation lesions scattered throughout the liver which are too small to characterize, but are favored to represent tiny cysts. Gallbladder is unremarkable in appearance. The appearance of the pancreas, spleen and bilateral adrenal glands is unremarkable. Numerous sub cm low-attenuation lesions scattered  throughout the kidneys bilaterally are too small to definitively characterize, but are favored to represent tiny cysts. Atherosclerosis throughout the abdominal and pelvic vasculature, without evidence of aneurysm or dissection. No definite lymphadenopathy identified within the abdomen or pelvis. Status post prostatectomy. Urinary bladder is unremarkable in appearance.  Musculoskeletal: There are no aggressive appearing lytic or blastic lesions noted in the visualized portions of the skeleton.  IMPRESSION: 1. Right inguinal hernia with associated small bowel obstruction, and decreased enhancement of the mucosa of several dilated small bowel loops, which is suggestive of early changes of bowel ischemia. There is a small volume of ascites and interloop fluid, which is presumably reactive. No pneumoperitoneum to strongly suggest frank perforation at  this time. Immediate surgical consultation is strongly recommended. 2. Additional incidental findings, as above. Critical Value/emergent results were called by telephone at the time of interpretation on 11/04/2013 at 11:43 PM to Dr. Tanna Furry, who verbally acknowledged these results.   Electronically Signed   By: Vinnie Langton M.D.   On: 10/27/2013 23:54   Dg Chest Port 1 View  11/10/2013   CLINICAL DATA:  Hypertension  EXAM: PORTABLE CHEST - 1 VIEW  COMPARISON:  11/09/2013  FINDINGS: Cardiomediastinal silhouette is stable. Stable NG tube and endotracheal tube position. Right subclavian central line is unchanged in position. Persistent bilateral hazy airspace disease suspicious for bilateral pneumonia rather than pulmonary edema. No pneumothorax.  IMPRESSION: Stable NG tube and endotracheal tube position. Right subclavian central line is unchanged in position. Persistent bilateral hazy airspace disease suspicious for bilateral pneumonia rather than pulmonary edema. No pneumothorax.   Electronically Signed   By: Lahoma Crocker M.D.   On: 11/10/2013 07:53   Portable Chest  Xray  11/09/2013   CLINICAL DATA:  Endotracheal tube placement  EXAM: PORTABLE CHEST - 1 VIEW  COMPARISON:  DG CHEST 1V PORT dated 07/29/2013  FINDINGS: There is an endotracheal tube with the tip 2.5 cm above the carina. There is a nasogastric to with the tip coiled in the left upper quadrant likely within the stomach. There are bilateral interstitial and alveolar airspace opacities, right greater than left. There is no pneumothorax. Normal cardiomediastinal silhouette. Unremarkable osseous structures.  IMPRESSION: Bilateral interstitial and alveolar airspace opacities, right greater than left. This may reflect atypical pulmonary edema versus multi lobar pneumonia.   Electronically Signed   By: Kathreen Devoid   On: 11/09/2013 17:01   Dg Chest Port 1v Same Day  11/10/2013   CLINICAL DATA:  Right central line placement.  EXAM: PORTABLE CHEST - 1 VIEW SAME DAY  COMPARISON:  DG CHEST 1V PORT dated 11/09/2013 at 1649 hr  FINDINGS: Interval placement of right subclavian central venous catheter with distal tip projecting in mid superior vena cava. No pneumothorax. Endotracheal tube tip projects 5.1 cm above the carina. Nasogastric tube looped in the stomach, distal tip projecting in proximal stomach. Multiple EKG lines overlie the patient and may obscure subtle underlying pathology.  Increasing alveolar airspace opacities with mild interstitial prominence. Cardiomediastinal silhouette is nonsuspicious, mildly calcified aortic knob. No pleural effusions.  IMPRESSION: Interval placement of right subclavian central venous catheter with distal tip projecting in mid superior vena cava. No pneumothorax. No apparent change in remaining life support lines.  Worsening alveolar airspace opacities concerning for multifocal pneumonia, less likely focal edema in a background of mild interstitial prominence   Electronically Signed   By: Elon Alas   On: 11/10/2013 00:13    Scheduled Meds: . antiseptic oral rinse  15 mL Mouth  Rinse QID  . chlorhexidine  15 mL Mouth Rinse BID  . enoxaparin (LOVENOX) injection  40 mg Subcutaneous Q24H  . famotidine (PEPCID) IV  20 mg Intravenous Q12H  . ipratropium-albuterol  3 mL Nebulization Q4H  . piperacillin-tazobactam (ZOSYN)  IV  3.375 g Intravenous Q8H  . potassium chloride  10 mEq Intravenous Q1 Hr x 6  . sodium chloride  500 mL Intravenous Once  . sodium chloride  10-40 mL Intracatheter Q12H  . vancomycin  750 mg Intravenous Q24H   Continuous Infusions: . sodium chloride 100 mL/hr at 11/09/13 2219  . fentaNYL infusion INTRAVENOUS 60 mcg/hr (11/10/13 0603)  . phenylephrine (NEO-SYNEPHRINE) Adult infusion 40 mcg/min (11/10/13  0900)    Active Problems:   SBO (small bowel obstruction)   HTN (hypertension), benign   Acute respiratory failure with hypoxia   Acute encephalopathy   Aspiration pneumonia   Small bowel obstruction    Time spent: critical care: 82mins    Keontre Defino  [facility] Pager [phone]. If 7PM-7AM, please contact night-coverage at [email or web address], password [facility] 11/10/2013, 10:08 AM  LOS: 2 days

## 2013-11-10 NOTE — Care Management Note (Signed)
    Page 1 of 1   11/10/2013     2:48:20 PM CARE MANAGEMENT NOTE 11/10/2013  Patient:  Douglas Mckay, Douglas Mckay   Account Number:  [id number]  Date Initiated:  11/10/2013  Documentation initiated by:  Theophilus Kinds  Subjective/Objective Assessment:   Pt admitted from home with SBO. Pt lives alone and has a sister who lives close by. Pt currently on the ventilator.     Action/Plan:   Will continue to follow for discharge planning needs.   Anticipated DC Date:     Anticipated DC Plan:        DC Planning Services  CM consult      Choice offered to / List presented to:             Status of service:  Completed, signed off Medicare Important Message given?   (If response is "NO", the following Medicare IM given date fields will be blank) Date Medicare IM given:   Date Additional Medicare IM given:    Discharge Disposition:    Per UR Regulation:    If discussed at Long Length of Stay Meetings, dates discussed:    Comments:  11/10/13 [place], RN BSN CM

## 2013-11-11 ENCOUNTER — Inpatient Hospital Stay (HOSPITAL_COMMUNITY): Payer: Medicare Other

## 2013-11-11 ENCOUNTER — Encounter (HOSPITAL_COMMUNITY): Admission: EM | Disposition: E | Payer: Self-pay | Source: Home / Self Care | Attending: Internal Medicine

## 2013-11-11 LAB — CBC
HCT: 28.6 % — ABNORMAL LOW (ref 39.0–52.0)
Hemoglobin: 9.3 g/dL — ABNORMAL LOW (ref 13.0–17.0)
MCH: 27 pg (ref 26.0–34.0)
MCHC: 32.5 g/dL (ref 30.0–36.0)
MCV: 82.9 fL (ref 78.0–100.0)
Platelets: 110 10*3/uL — ABNORMAL LOW (ref 150–400)
RBC: 3.45 MIL/uL — AB (ref 4.22–5.81)
RDW: 14.6 % (ref 11.5–15.5)
WBC: 8 10*3/uL (ref 4.0–10.5)

## 2013-11-11 LAB — BLOOD GAS, ARTERIAL
Acid-base deficit: 3.3 mmol/L — ABNORMAL HIGH (ref 0.0–2.0)
Bicarbonate: 21 mEq/L (ref 20.0–24.0)
DRAWN BY: 22223
FIO2: 40 %
O2 Saturation: 98.1 %
PATIENT TEMPERATURE: 37
PEEP/CPAP: 5 cmH2O
PH ART: 7.38 (ref 7.350–7.450)
RATE: 15 resp/min
TCO2: 18.8 mmol/L (ref 0–100)
VT: 400 mL
pCO2 arterial: 36.3 mmHg (ref 35.0–45.0)
pO2, Arterial: 116 mmHg — ABNORMAL HIGH (ref 80.0–100.0)

## 2013-11-11 LAB — BASIC METABOLIC PANEL
BUN: 19 mg/dL (ref 6–23)
CO2: 21 meq/L (ref 19–32)
Calcium: 7.3 mg/dL — ABNORMAL LOW (ref 8.4–10.5)
Chloride: 115 mEq/L — ABNORMAL HIGH (ref 96–112)
Creatinine, Ser: 1.26 mg/dL (ref 0.50–1.35)
GFR calc Af Amer: 61 mL/min — ABNORMAL LOW (ref >90)
GFR calc non Af Amer: 52 mL/min — ABNORMAL LOW (ref >90)
GLUCOSE: 79 mg/dL (ref 70–99)
Potassium: 3.9 mEq/L (ref 3.7–5.3)
Sodium: 147 mEq/L (ref 137–147)

## 2013-11-11 SURGERY — REPAIR, HERNIA, INGUINAL, ADULT
Anesthesia: Choice | Laterality: Right

## 2013-11-11 MED ORDER — FENTANYL CITRATE 0.05 MG/ML IJ SOLN
INTRAMUSCULAR | Status: AC
  Filled 2013-11-11: qty 50

## 2013-11-11 NOTE — Progress Notes (Signed)
ANTIBIOTIC CONSULT NOTE - follow up  Pharmacy Consult for Zosyn & Vancomycin Indication: Aspiration Pneumonia  Allergies  Allergen Reactions  . Solu-Medrol [Methylprednisolone Acetate] Other (See Comments)    Severe encephalopathy/delirium.   Patient Measurements: Height: 5\' 1"  (154.9 cm) Weight: 123 lb 10.9 oz (56.1 kg) IBW/kg (Calculated) : 52.3  Vital Signs: Temp: 99.5 F (37.5 C) (05/22 0730) Temp src: Axillary (05/22 0730) BP: 103/65 mmHg (05/22 0800) Pulse Rate: 112 (05/22 0800) Intake/Output from previous day: 05/21 0701 - 05/22 0700 In: 4479.6 [I.V.:3279.6; IV Piggyback:1200] Out: 1850 [Urine:1700; Emesis/NG output:150] Intake/Output from this shift:    Labs:  Recent Labs  11/09/13 2247 11/10/13 0443 11/09/2013 0401  WBC 2.0* 3.9* 8.0  HGB 11.0* 10.0* 9.3*  PLT 107* 108* 110*  CREATININE 1.28 1.20 1.26   Estimated Creatinine Clearance: 35.2 ml/min (by C-G formula based on Cr of 1.26). No results found for this basename: VANCOTROUGH, Corlis Leak, VANCORANDOM, [place], GENTPEAK, GENTRANDOM, TOBRATROUGH, TOBRAPEAK, TOBRARND, AMIKACINPEAK, AMIKACINTROU, AMIKACIN,  in the last 72 hours   Microbiology: Recent Results (from the past 720 hour(s))  MRSA PCR SCREENING     Status: None   Collection Time    11/09/13  6:15 PM      Result Value Ref Range Status   MRSA by PCR NEGATIVE  NEGATIVE Final   Comment:            The GeneXpert MRSA Assay (FDA     approved for NASAL specimens     only), is one component of a     comprehensive MRSA colonization     surveillance program. It is not     intended to diagnose MRSA     infection nor to guide or     monitor treatment for     MRSA infections.  CULTURE, BLOOD (ROUTINE X 2)     Status: None   Collection Time    11/09/13 10:47 PM      Result Value Ref Range Status   Specimen Description BLOOD LEFT WRIST   Final   Special Requests     Final   Value: BOTTLES DRAWN AEROBIC AND ANAEROBIC AEB 6CC ANA 4CC   Culture NO  GROWTH 1 DAY   Final   Report Status PENDING   Incomplete  CULTURE, BLOOD (ROUTINE X 2)     Status: None   Collection Time    11/09/13 10:47 PM      Result Value Ref Range Status   Specimen Description BLOOD CENTRAL LINE   Final   Special Requests BOTTLES DRAWN AEROBIC AND ANAEROBIC 8CC EACH   Final   Culture NO GROWTH 1 DAY   Final   Report Status PENDING   Incomplete   Medical History: Past Medical History  Diagnosis Date  . Hypertension   . Hypercholesterolemia   . Cancer     prostate   Medications:  Scheduled:  . antiseptic oral rinse  15 mL Mouth Rinse QID  . chlorhexidine  15 mL Mouth Rinse BID  . enoxaparin (LOVENOX) injection  40 mg Subcutaneous Q24H  . famotidine (PEPCID) IV  20 mg Intravenous Q12H  . ipratropium-albuterol  3 mL Nebulization Q4H  . piperacillin-tazobactam (ZOSYN)  IV  3.375 g Intravenous Q8H  . sodium chloride  10-40 mL Intracatheter Q12H  . vancomycin  750 mg Intravenous Q24H   Assessment: Zosyn & Vancomycin for aspiration pneumonia Estimated Creatinine Clearance: 35.2 ml/min (by C-G formula based on Cr of 1.26).    SCr stable.  Goal of Therapy:  Eradicate Infection  Plan: Vancomycin 750 mg IV every 24 hours Vancomycin trough at steady state  Zosyn 3.375 GM IV every 8 hours, each dose over 4 hours Monitor renal function Labs per protocol  [employer] 11/30/2013,9:12 AM

## 2013-11-11 NOTE — Progress Notes (Signed)
Subjective: I saw him during his wakeup assessment and he was very agitated. He remains on Neo-Synephrine but the dose is been able to be decreased markedly. No new problems have been noted through the night but he has had some more fever.  Objective: Vital signs in last 24 hours: Temp:  [98.3 F (36.8 C)-101.4 F (38.6 C)] 100.2 F (37.9 C) (05/22 0400) Pulse Rate:  [88-134] 110 (05/22 0600) Resp:  [13-22] 16 (05/22 0600) BP: (80-136)/(54-111) 129/82 mmHg (05/22 0600) SpO2:  [79 %-100 %] 97 % (05/22 0600) FiO2 (%):  [40 %-45 %] 40 % (05/22 0706) Weight:  [56.1 kg (123 lb 10.9 oz)] 56.1 kg (123 lb 10.9 oz) (05/22 0600) Weight change: 1.1 kg (2 lb 6.8 oz) Last BM Date: 11/05/13  Intake/Output from previous day: 05/21 0701 - 05/22 0700 In: 1200 [IV Piggyback:1200] Out: 1700 [Urine:1700]  PHYSICAL EXAM General appearance: alert, moderate distress and Agitated Resp: rhonchi bilaterally Cardio: regular rate and rhythm, S1, S2 normal, no murmur, click, rub or gallop GI: soft, non-tender; bowel sounds normal; no masses,  no organomegaly Extremities: He has some third spacing of fluid and has some conjunctival edema  Lab Results:    Basic Metabolic Panel:  Recent Labs  11/10/13 0443 11/04/2013 0401  NA 147 147  K 3.6* 3.9  CL 112 115*  CO2 21 21  GLUCOSE 100* 79  BUN 22 19  CREATININE 1.20 1.26  CALCIUM 7.1* 7.3*   Liver Function Tests:  Recent Labs  11/13/2013 2222  AST 22  ALT 16  ALKPHOS 65  BILITOT 0.4  PROT 7.5  ALBUMIN 4.3    Recent Labs  11/17/2013 2222  LIPASE 22   No results found for this basename: AMMONIA,  in the last 72 hours CBC:  Recent Labs  11/16/2013 2222  11/10/13 0443 10/21/2013 0401  WBC 3.5*  < > 3.9* 8.0  NEUTROABS 3.1  --   --   --   HGB 12.4*  < > 10.0* 9.3*  HCT 39.4  < > 31.6* 28.6*  MCV 84.5  < > 85.9 82.9  PLT 152  < > 108* 110*  < > = values in this interval not displayed. Cardiac Enzymes: No results found for this  basename: CKTOTAL, CKMB, CKMBINDEX, TROPONINI,  in the last 72 hours BNP:  Recent Labs  11/09/13 2247  PROBNP 1241.0*   D-Dimer: No results found for this basename: DDIMER,  in the last 72 hours CBG: No results found for this basename: GLUCAP,  in the last 72 hours Hemoglobin A1C: No results found for this basename: HGBA1C,  in the last 72 hours Fasting Lipid Panel: No results found for this basename: CHOL, HDL, LDLCALC, TRIG, CHOLHDL, LDLDIRECT,  in the last 72 hours Thyroid Function Tests: No results found for this basename: TSH, T4TOTAL, FREET4, T3FREE, THYROIDAB,  in the last 72 hours Anemia Panel: No results found for this basename: VITAMINB12, FOLATE, FERRITIN, TIBC, IRON, RETICCTPCT,  in the last 72 hours Coagulation:  Recent Labs  11/09/13 2351  LABPROT 18.1*  INR 1.54*   Urine Drug Screen: Drugs of Abuse  No results found for this basename: labopia, cocainscrnur, labbenz, amphetmu, thcu, labbarb    Alcohol Level: No results found for this basename: ETH,  in the last 72 hours Urinalysis: No results found for this basename: COLORURINE, APPERANCEUR, LABSPEC, PHURINE, GLUCOSEU, HGBUR, BILIRUBINUR, KETONESUR, PROTEINUR, UROBILINOGEN, NITRITE, LEUKOCYTESUR,  in the last 72 hours Misc. Labs:  ABGS  Recent Labs  11/11/13 0425  PHART 7.380  PO2ART 116.0*  TCO2 18.8  HCO3 21.0   CULTURES Recent Results (from the past 240 hour(s))  MRSA PCR SCREENING     Status: None   Collection Time    11/09/13  6:15 PM      Result Value Ref Range Status   MRSA by PCR NEGATIVE  NEGATIVE Final   Comment:            The GeneXpert MRSA Assay (FDA     approved for NASAL specimens     only), is one component of a     comprehensive MRSA colonization     surveillance program. It is not     intended to diagnose MRSA     infection nor to guide or     monitor treatment for     MRSA infections.  CULTURE, BLOOD (ROUTINE X 2)     Status: None   Collection Time    11/09/13 10:47  PM      Result Value Ref Range Status   Specimen Description BLOOD LEFT WRIST   Final   Special Requests     Final   Value: BOTTLES DRAWN AEROBIC AND ANAEROBIC AEB 6CC ANA 4CC   Culture NO GROWTH 1 DAY   Final   Report Status PENDING   Incomplete  CULTURE, BLOOD (ROUTINE X 2)     Status: None   Collection Time    11/09/13 10:47 PM      Result Value Ref Range Status   Specimen Description BLOOD CENTRAL LINE   Final   Special Requests BOTTLES DRAWN AEROBIC AND ANAEROBIC [place]   Final   Culture NO GROWTH 1 DAY   Final   Report Status PENDING   Incomplete   Studies/Results: Dg Chest Port 1 View  11/10/2013   CLINICAL DATA:  Hypertension  EXAM: PORTABLE CHEST - 1 VIEW  COMPARISON:  11/09/2013  FINDINGS: Cardiomediastinal silhouette is stable. Stable NG tube and endotracheal tube position. Right subclavian central line is unchanged in position. Persistent bilateral hazy airspace disease suspicious for bilateral pneumonia rather than pulmonary edema. No pneumothorax.  IMPRESSION: Stable NG tube and endotracheal tube position. Right subclavian central line is unchanged in position. Persistent bilateral hazy airspace disease suspicious for bilateral pneumonia rather than pulmonary edema. No pneumothorax.   Electronically Signed   By: Lahoma Crocker M.D.   On: 11/10/2013 07:53   Portable Chest Xray  11/09/2013   CLINICAL DATA:  Endotracheal tube placement  EXAM: PORTABLE CHEST - 1 VIEW  COMPARISON:  DG CHEST 1V PORT dated 07/29/2013  FINDINGS: There is an endotracheal tube with the tip 2.5 cm above the carina. There is a nasogastric to with the tip coiled in the left upper quadrant likely within the stomach. There are bilateral interstitial and alveolar airspace opacities, right greater than left. There is no pneumothorax. Normal cardiomediastinal silhouette. Unremarkable osseous structures.  IMPRESSION: Bilateral interstitial and alveolar airspace opacities, right greater than left. This may reflect  atypical pulmonary edema versus multi lobar pneumonia.   Electronically Signed   By: Kathreen Devoid   On: 11/09/2013 17:01   Dg Chest Port 1v Same Day  11/10/2013   CLINICAL DATA:  Right central line placement.  EXAM: PORTABLE CHEST - 1 VIEW SAME DAY  COMPARISON:  DG CHEST 1V PORT dated 11/09/2013 at 1649 hr  FINDINGS: Interval placement of right subclavian central venous catheter with distal tip projecting in mid superior vena cava. No pneumothorax. Endotracheal tube tip projects  5.1 cm above the carina. Nasogastric tube looped in the stomach, distal tip projecting in proximal stomach. Multiple EKG lines overlie the patient and may obscure subtle underlying pathology.  Increasing alveolar airspace opacities with mild interstitial prominence. Cardiomediastinal silhouette is nonsuspicious, mildly calcified aortic knob. No pleural effusions.  IMPRESSION: Interval placement of right subclavian central venous catheter with distal tip projecting in mid superior vena cava. No pneumothorax. No apparent change in remaining life support lines.  Worsening alveolar airspace opacities concerning for multifocal pneumonia, less likely focal edema in a background of mild interstitial prominence   Electronically Signed   By: Elon Alas   On: 11/10/2013 00:13    Medications:  Prior to Admission:  Prescriptions prior to admission  Medication Sig Dispense Refill  . acetaminophen (TYLENOL) 325 MG tablet Take 2 tablets (650 mg total) by mouth every 6 (six) hours as needed for mild pain (or Fever >/= 101).      Marland Kitchen amLODipine-benazepril (LOTREL) 5-40 MG per capsule Take 1 capsule by mouth daily.      . carvedilol (COREG) 3.125 MG tablet Take 3.125 mg by mouth daily.      . simvastatin (ZOCOR) 20 MG tablet Take 20 mg by mouth every evening.       Scheduled: . antiseptic oral rinse  15 mL Mouth Rinse QID  . chlorhexidine  15 mL Mouth Rinse BID  . enoxaparin (LOVENOX) injection  40 mg Subcutaneous Q24H  . famotidine  (PEPCID) IV  20 mg Intravenous Q12H  . ipratropium-albuterol  3 mL Nebulization Q4H  . piperacillin-tazobactam (ZOSYN)  IV  3.375 g Intravenous Q8H  . sodium chloride  10-40 mL Intracatheter Q12H  . vancomycin  750 mg Intravenous Q24H   Continuous: . sodium chloride 100 mL/hr at 10/21/2013 0416  . fentaNYL infusion INTRAVENOUS 100 mcg/hr (11/10/13 2000)  . phenylephrine (NEO-SYNEPHRINE) Adult infusion 30 mcg/min (11/01/2013 0417)   DZH:[id number], acetaminophen, albuterol, fentaNYL, fentaNYL, fentaNYL, LORazepam, naLOXone (NARCAN)  injection, ondansetron, sodium chloride, sodium chloride  Assesment: He was admitted with small bowel obstruction. He seems to have aspirated and developed acute hypoxic respiratory failure requiring intubation and mechanical ventilation. Chest x-ray this morning has not officially been read but by my reading looks about the same, perhaps a little bit better.  He was septic and requiring pressure support but he is better now.  He is more alert and attempting to communicate but very agitated. Principal Problem:   Septic shock Active Problems:   SBO (small bowel obstruction)   HTN (hypertension), benign   Acute respiratory failure with hypoxia   Acute encephalopathy   Aspiration pneumonia   Small bowel obstruction    Plan: Continue efforts to get him off of pressors. He is now on 40% oxygen and oxygenating well. He can potentially be extubated later today depending on how he does with pressor support agitation etc.    LOS: 3 days   Alonza Bogus 10/28/2013, 7:11 AM

## 2013-11-11 NOTE — Progress Notes (Signed)
[facility] PROGRESS NOTE  Diondre Pulis GLO:[id number] DOB: January 06, 1932 DOA: 13-Nov-2013 PCP: Marjo Bicker, MD  Assessment/Plan: 1. Septic shock. Likely related to aspiration pneumonia. Patient has been receiving aggressive hydration and continues to require vasopressors to maintain blood pressure. He is on broad-spectrum antibiotics. Blood cultures have shown no growth to date.  He continues to have fevers. Continue current treatments. 2. Aspiration pneumonia. Likely related to bowel obstruction. He is on broad-spectrum antibiotics. Continue current treatments.  Chest xray shows worsening infiltrates. 3. Acute respiratory failure, likely due to pneumonia. Patient required intubation on 5/20 when he failed bipap treatment. Pulmonology following. Will try and wean from vent as tolerated. 4. Small bowel obstruction with right inguinal hernia. Surgery is following.  Continuing NG tube decompression. 5. Acute encephalopathy. Likely related to acute illness.  Will re evaluate once patient is able to extubate.   Code Status: full code Family Communication: discussed with wife at the bedside Disposition Plan: home vs. Snf, pending hospital course   Consultants:  Gen Surgery  Pulmonology  Procedures:  Intubation 5/20>>  Central line placement 5/20  Antibiotics:  Vancomycin 5/20>>  Zosyn 5/20>>  HPI/Subjective: Sedated and intubated  Objective: Filed Vitals:   11/09/2013 0800  BP: 103/65  Pulse: 112  Temp:   Resp: 14    Intake/Output Summary (Last 24 hours) at 10/29/2013 1012 Last data filed at 11/06/2013 0700  Gross per 24 hour  Intake 4229.63 ml  Output   1450 ml  Net 2779.63 ml   Filed Weights   11/09/13 0557 11/10/13 0600 11/10/2013 0600  Weight: 53.7 kg (118 lb 6.2 oz) 55 kg (121 lb 4.1 oz) 56.1 kg (123 lb 10.9 oz)    Exam:   General:  Sedated, intubated on vent  Cardiovascular: S1, S2 tachycardic  Respiratory: rhonchi bilaterally  Abdomen: soft,  nt, nd, bs+  Musculoskeletal:  No edema b/l   Data Reviewed: Basic Metabolic Panel:  Recent Labs Lab 11/13/2013 2222 11/09/13 2247 11/10/13 0443 11/07/2013 0401  NA 148* 146 147 147  K 3.9 4.0 3.6* 3.9  CL 105 110 112 115*  CO2 28 23 21 21   GLUCOSE 204* 107* 100* 79  BUN 26* 23 22 19   CREATININE 1.19 1.28 1.20 1.26  CALCIUM 9.5 7.6* 7.1* 7.3*   Liver Function Tests:  Recent Labs Lab 11/13/2013 2222  AST 22  ALT 16  ALKPHOS 65  BILITOT 0.4  PROT 7.5  ALBUMIN 4.3    Recent Labs Lab 11-13-2013 2222  LIPASE 22   No results found for this basename: AMMONIA,  in the last 168 hours CBC:  Recent Labs Lab 2013-11-13 2222 11/09/13 2247 11/10/13 0443 10/29/2013 0401  WBC 3.5* 2.0* 3.9* 8.0  NEUTROABS 3.1  --   --   --   HGB 12.4* 11.0* 10.0* 9.3*  HCT 39.4 34.5* 31.6* 28.6*  MCV 84.5 84.4 85.9 82.9  PLT 152 107* 108* 110*   Cardiac Enzymes: No results found for this basename: CKTOTAL, CKMB, CKMBINDEX, TROPONINI,  in the last 168 hours BNP (last 3 results)  Recent Labs  11/09/13 2247  PROBNP 1241.0*   CBG: No results found for this basename: GLUCAP,  in the last 168 hours  Recent Results (from the past 240 hour(s))  MRSA PCR SCREENING     Status: None   Collection Time    11/09/13  6:15 PM      Result Value Ref Range Status   MRSA by PCR NEGATIVE  NEGATIVE Final   Comment:  The GeneXpert MRSA Assay (FDA     approved for NASAL specimens     only), is one component of a     comprehensive MRSA colonization     surveillance program. It is not     intended to diagnose MRSA     infection nor to guide or     monitor treatment for     MRSA infections.  CULTURE, BLOOD (ROUTINE X 2)     Status: None   Collection Time    11/09/13 10:47 PM      Result Value Ref Range Status   Specimen Description BLOOD LEFT WRIST   Final   Special Requests     Final   Value: BOTTLES DRAWN AEROBIC AND ANAEROBIC AEB 6CC ANA 4CC   Culture NO GROWTH 1 DAY   Final   Report  Status PENDING   Incomplete  CULTURE, BLOOD (ROUTINE X 2)     Status: None   Collection Time    11/09/13 10:47 PM      Result Value Ref Range Status   Specimen Description BLOOD CENTRAL LINE   Final   Special Requests BOTTLES DRAWN AEROBIC AND ANAEROBIC 8CC EACH   Final   Culture NO GROWTH 1 DAY   Final   Report Status PENDING   Incomplete     Studies: Dg Chest Port 1 View  11/16/2013   CLINICAL DATA:  Respiratory failure, history hypertension, history prostate cancer  EXAM: PORTABLE CHEST - 1 VIEW  COMPARISON:  Portable exam 3810 hr compared to 11/10/2013  FINDINGS: Tip of endotracheal tube projects 4.5 cm above carinal.  Nasogastric tube extends into stomach.  RIGHT subclavian central venous catheter with tip projecting over mid SVC.  Normal heart size and mediastinal contours.  Atherosclerotic calcification aorta.  Diffuse pulmonary infiltrates bilaterally question edema or infection, slightly increased since previous exam.  No pleural effusion or pneumothorax.  Bones unremarkable.  IMPRESSION: BILATERAL pulmonary infiltrates slightly increased since previous study, question pulmonary edema versus infection.   Electronically Signed   By: Lavonia Dana M.D.   On: Nov 16, 2013 08:22   Dg Chest Port 1 View  11/10/2013   CLINICAL DATA:  Hypertension  EXAM: PORTABLE CHEST - 1 VIEW  COMPARISON:  11/09/2013  FINDINGS: Cardiomediastinal silhouette is stable. Stable NG tube and endotracheal tube position. Right subclavian central line is unchanged in position. Persistent bilateral hazy airspace disease suspicious for bilateral pneumonia rather than pulmonary edema. No pneumothorax.  IMPRESSION: Stable NG tube and endotracheal tube position. Right subclavian central line is unchanged in position. Persistent bilateral hazy airspace disease suspicious for bilateral pneumonia rather than pulmonary edema. No pneumothorax.   Electronically Signed   By: Lahoma Crocker M.D.   On: 11/10/2013 07:53   Portable Chest  Xray  11/09/2013   CLINICAL DATA:  Endotracheal tube placement  EXAM: PORTABLE CHEST - 1 VIEW  COMPARISON:  DG CHEST 1V PORT dated 07/29/2013  FINDINGS: There is an endotracheal tube with the tip 2.5 cm above the carina. There is a nasogastric to with the tip coiled in the left upper quadrant likely within the stomach. There are bilateral interstitial and alveolar airspace opacities, right greater than left. There is no pneumothorax. Normal cardiomediastinal silhouette. Unremarkable osseous structures.  IMPRESSION: Bilateral interstitial and alveolar airspace opacities, right greater than left. This may reflect atypical pulmonary edema versus multi lobar pneumonia.   Electronically Signed   By: Kathreen Devoid   On: 11/09/2013 17:01   Dg Chest Port 1v  Same Day  11/10/2013   CLINICAL DATA:  Right central line placement.  EXAM: PORTABLE CHEST - 1 VIEW SAME DAY  COMPARISON:  DG CHEST 1V PORT dated 11/09/2013 at 1649 hr  FINDINGS: Interval placement of right subclavian central venous catheter with distal tip projecting in mid superior vena cava. No pneumothorax. Endotracheal tube tip projects 5.1 cm above the carina. Nasogastric tube looped in the stomach, distal tip projecting in proximal stomach. Multiple EKG lines overlie the patient and may obscure subtle underlying pathology.  Increasing alveolar airspace opacities with mild interstitial prominence. Cardiomediastinal silhouette is nonsuspicious, mildly calcified aortic knob. No pleural effusions.  IMPRESSION: Interval placement of right subclavian central venous catheter with distal tip projecting in mid superior vena cava. No pneumothorax. No apparent change in remaining life support lines.  Worsening alveolar airspace opacities concerning for multifocal pneumonia, less likely focal edema in a background of mild interstitial prominence   Electronically Signed   By: Elon Alas   On: 11/10/2013 00:13    Scheduled Meds: . antiseptic oral rinse  15 mL Mouth  Rinse QID  . chlorhexidine  15 mL Mouth Rinse BID  . enoxaparin (LOVENOX) injection  40 mg Subcutaneous Q24H  . famotidine (PEPCID) IV  20 mg Intravenous Q12H  . ipratropium-albuterol  3 mL Nebulization Q4H  . piperacillin-tazobactam (ZOSYN)  IV  3.375 g Intravenous Q8H  . sodium chloride  10-40 mL Intracatheter Q12H  . vancomycin  750 mg Intravenous Q24H   Continuous Infusions: . sodium chloride 100 mL/hr at 2013-12-01 0700  . fentaNYL infusion INTRAVENOUS 85 mcg/hr (12/01/13 0745)  . phenylephrine (NEO-SYNEPHRINE) Adult infusion 30 mcg/min (2013/12/01 0700)    Principal Problem:   Septic shock Active Problems:   SBO (small bowel obstruction)   HTN (hypertension), benign   Acute respiratory failure with hypoxia   Acute encephalopathy   Aspiration pneumonia   Small bowel obstruction    Time spent: critical care: 40mins    Marcelia Petersen  [facility] Pager [phone]. If 7PM-7AM, please contact night-coverage at [email or web address], password [facility] December 01, 2013, 10:12 AM  LOS: 3 days

## 2013-11-12 ENCOUNTER — Inpatient Hospital Stay (HOSPITAL_COMMUNITY): Payer: Medicare Other

## 2013-11-12 DIAGNOSIS — I369 Nonrheumatic tricuspid valve disorder, unspecified: Secondary | ICD-10-CM

## 2013-11-12 LAB — BASIC METABOLIC PANEL
BUN: 16 mg/dL (ref 6–23)
CHLORIDE: 114 meq/L — AB (ref 96–112)
CO2: 22 mEq/L (ref 19–32)
Calcium: 7.8 mg/dL — ABNORMAL LOW (ref 8.4–10.5)
Creatinine, Ser: 1.28 mg/dL (ref 0.50–1.35)
GFR calc non Af Amer: 52 mL/min — ABNORMAL LOW (ref >90)
GFR, EST AFRICAN AMERICAN: 60 mL/min — AB (ref >90)
Glucose, Bld: 79 mg/dL (ref 70–99)
Potassium: 3.6 mEq/L — ABNORMAL LOW (ref 3.7–5.3)
Sodium: 146 mEq/L (ref 137–147)

## 2013-11-12 LAB — BLOOD GAS, ARTERIAL
Acid-base deficit: 4.2 mmol/L — ABNORMAL HIGH (ref 0.0–2.0)
Bicarbonate: 20.1 mEq/L (ref 20.0–24.0)
DRAWN BY: 317771
FIO2: 0.4 %
O2 Saturation: 98.7 %
PEEP: 5 cmH2O
PH ART: 7.381 (ref 7.350–7.450)
PO2 ART: 130 mmHg — AB (ref 80.0–100.0)
Patient temperature: 37.2
RATE: 15 resp/min
TCO2: 18.7 mmol/L (ref 0–100)
VT: 500 mL
pCO2 arterial: 34.7 mmHg — ABNORMAL LOW (ref 35.0–45.0)

## 2013-11-12 LAB — CBC
HCT: 28.5 % — ABNORMAL LOW (ref 39.0–52.0)
Hemoglobin: 9.1 g/dL — ABNORMAL LOW (ref 13.0–17.0)
MCH: 26.8 pg (ref 26.0–34.0)
MCHC: 31.9 g/dL (ref 30.0–36.0)
MCV: 84.1 fL (ref 78.0–100.0)
PLATELETS: 97 10*3/uL — AB (ref 150–400)
RBC: 3.39 MIL/uL — ABNORMAL LOW (ref 4.22–5.81)
RDW: 13.8 % (ref 11.5–15.5)
WBC: 8.1 10*3/uL (ref 4.0–10.5)

## 2013-11-12 LAB — PRO B NATRIURETIC PEPTIDE: Pro B Natriuretic peptide (BNP): 529.2 pg/mL — ABNORMAL HIGH (ref 0–450)

## 2013-11-12 LAB — VANCOMYCIN, RANDOM: Vancomycin Rm: 5.5 ug/mL

## 2013-11-12 MED ORDER — POTASSIUM CHLORIDE 10 MEQ/100ML IV SOLN
10.0000 meq | INTRAVENOUS | Status: AC
  Administered 2013-11-12 (×4): 10 meq via INTRAVENOUS
  Filled 2013-11-12: qty 100

## 2013-11-12 NOTE — Progress Notes (Signed)
[facility] PROGRESS NOTE  Ryu Cerreta RWE:[id number] DOB: 10-18-1931 DOA: 12-06-2013 PCP: Marjo Bicker, MD  Assessment/Plan: 1. Septic shock. Likely related to aspiration pneumonia. Patient has been receiving aggressive hydration and continues to require vasopressors to maintain blood pressure. Attempts are being made to wean him off neosynephrine, but he has not tolerated this as of yet. He is on broad-spectrum antibiotics. Blood cultures have shown no growth to date.  He continues to have fevers. Continue current treatments. 2. Aspiration pneumonia. Likely related to bowel obstruction. He is on vancomycin and zosyn. Continue current treatments.  Chest xray shows bilateral infiltrates, ?component of underlying pulmonary edema.  Will decrease IV fluid infusion rate, recheck BNP and check echo. 3. Acute respiratory failure, likely due to pneumonia. Patient required intubation on 5/20 when he failed bipap treatment. Pulmonology following. Will try and wean from vent as tolerated. 4. Small bowel obstruction with right inguinal hernia. Surgery is following.  Continuing NG tube decompression. 5. Acute encephalopathy. Likely related to acute illness.  Will re evaluate once patient is able to extubate. 6. Hypokalemia, replace.   Code Status: full code Family Communication: discussed with wife at the bedside Disposition Plan: home vs. Snf, pending hospital course   Consultants:  Gen Surgery  Pulmonology  Procedures:  Intubation 5/20>>  Central line placement 5/20  Antibiotics:  Vancomycin 5/20>>  Zosyn 5/20>>  HPI/Subjective: Sedated and intubated  Objective: Filed Vitals:   11/12/13 0730  BP: 98/58  Pulse: 91  Temp: 100.6 F (38.1 C)  Resp: 15    Intake/Output Summary (Last 24 hours) at 11/12/13 1001 Last data filed at 11/12/13 0800  Gross per 24 hour  Intake 3082.95 ml  Output   2000 ml  Net 1082.95 ml   Filed Weights   11/10/13 0600 11/02/2013 0600  11/12/13 0430  Weight: 55 kg (121 lb 4.1 oz) 56.1 kg (123 lb 10.9 oz) 63.2 kg (139 lb 5.3 oz)    Exam:   General:  Sedated, intubated on vent  Cardiovascular: S1, S2 tachycardic  Respiratory: rhonchi bilaterally  Abdomen: soft, nt, nd, bs+  Musculoskeletal:  No edema b/l   Data Reviewed: Basic Metabolic Panel:  Recent Labs Lab 12/06/13 2222 11/09/13 2247 11/10/13 0443 10/29/2013 0401 11/12/13 0513  NA 148* 146 147 147 146  K 3.9 4.0 3.6* 3.9 3.6*  CL 105 110 112 115* 114*  CO2 28 23 21 21 22   GLUCOSE 204* 107* 100* 79 79  BUN 26* 23 22 19 16   CREATININE 1.19 1.28 1.20 1.26 1.28  CALCIUM 9.5 7.6* 7.1* 7.3* 7.8*   Liver Function Tests:  Recent Labs Lab 12/06/2013 2222  AST 22  ALT 16  ALKPHOS 65  BILITOT 0.4  PROT 7.5  ALBUMIN 4.3    Recent Labs Lab 12-06-13 2222  LIPASE 22   No results found for this basename: AMMONIA,  in the last 168 hours CBC:  Recent Labs Lab Dec 06, 2013 2222 11/09/13 2247 11/10/13 0443 11/01/2013 0401 11/12/13 0513  WBC 3.5* 2.0* 3.9* 8.0 8.1  NEUTROABS 3.1  --   --   --   --   HGB 12.4* 11.0* 10.0* 9.3* 9.1*  HCT 39.4 34.5* 31.6* 28.6* 28.5*  MCV 84.5 84.4 85.9 82.9 84.1  PLT 152 107* 108* 110* 97*   Cardiac Enzymes: No results found for this basename: CKTOTAL, CKMB, CKMBINDEX, TROPONINI,  in the last 168 hours BNP (last 3 results)  Recent Labs  11/09/13 2247  PROBNP 1241.0*   CBG: No results found  for this basename: GLUCAP,  in the last 168 hours  Recent Results (from the past 240 hour(s))  MRSA PCR SCREENING     Status: None   Collection Time    11/09/13  6:15 PM      Result Value Ref Range Status   MRSA by PCR NEGATIVE  NEGATIVE Final   Comment:            The GeneXpert MRSA Assay (FDA     approved for NASAL specimens     only), is one component of a     comprehensive MRSA colonization     surveillance program. It is not     intended to diagnose MRSA     infection nor to guide or     monitor treatment for      MRSA infections.  CULTURE, BLOOD (ROUTINE X 2)     Status: None   Collection Time    11/09/13 10:47 PM      Result Value Ref Range Status   Specimen Description BLOOD LEFT WRIST   Final   Special Requests     Final   Value: BOTTLES DRAWN AEROBIC AND ANAEROBIC AEB=6CC ANA=4CC   Culture NO GROWTH 3 DAYS   Final   Report Status PENDING   Incomplete  CULTURE, BLOOD (ROUTINE X 2)     Status: None   Collection Time    11/09/13 10:47 PM      Result Value Ref Range Status   Specimen Description BLOOD CENTRAL LINE DRAWN BY RN   Final   Special Requests BOTTLES DRAWN AEROBIC AND ANAEROBIC 8CC EACH   Final   Culture NO GROWTH 3 DAYS   Final   Report Status PENDING   Incomplete     Studies: Dg Chest Port 1 View  11/20/2013   CLINICAL DATA:  Respiratory failure, history hypertension, history prostate cancer  EXAM: PORTABLE CHEST - 1 VIEW  COMPARISON:  Portable exam 4034 hr compared to 11/10/2013  FINDINGS: Tip of endotracheal tube projects 4.5 cm above carinal.  Nasogastric tube extends into stomach.  RIGHT subclavian central venous catheter with tip projecting over mid SVC.  Normal heart size and mediastinal contours.  Atherosclerotic calcification aorta.  Diffuse pulmonary infiltrates bilaterally question edema or infection, slightly increased since previous exam.  No pleural effusion or pneumothorax.  Bones unremarkable.  IMPRESSION: BILATERAL pulmonary infiltrates slightly increased since previous study, question pulmonary edema versus infection.   Electronically Signed   By: Lavonia Dana M.D.   On: 11/05/2013 08:22    Scheduled Meds: . antiseptic oral rinse  15 mL Mouth Rinse QID  . chlorhexidine  15 mL Mouth Rinse BID  . enoxaparin (LOVENOX) injection  40 mg Subcutaneous Q24H  . famotidine (PEPCID) IV  20 mg Intravenous Q12H  . ipratropium-albuterol  3 mL Nebulization Q4H  . piperacillin-tazobactam (ZOSYN)  IV  3.375 g Intravenous Q8H  . potassium chloride  10 mEq Intravenous Q1 Hr x 4   . sodium chloride  10-40 mL Intracatheter Q12H  . vancomycin  750 mg Intravenous Q24H   Continuous Infusions: . sodium chloride 100 mL/hr at 11/12/13 0800  . fentaNYL infusion INTRAVENOUS 100 mcg/hr (11/12/13 0800)  . phenylephrine (NEO-SYNEPHRINE) Adult infusion 100 mcg/min (11/12/13 0956)    Principal Problem:   Septic shock Active Problems:   SBO (small bowel obstruction)   HTN (hypertension), benign   Acute respiratory failure with hypoxia   Acute encephalopathy   Aspiration pneumonia   Small bowel obstruction  Time spent: critical care: 93mins    [place] Mullett  [facility] Pager [phone]. If 7PM-7AM, please contact night-coverage at [email or web address], password [email or web address] 11/12/2013, 10:01 AM  LOS: 4 days

## 2013-11-12 NOTE — Plan of Care (Signed)
Problem: Phase I Progression Outcomes Goal: VTE prophylaxis Outcome: Completed/Met Date Met:  11/12/13 SCD's Goal: GIProphysixis Outcome: Completed/Met Date Met:  11/12/13 Pepsid IV Goal: Oral Care per Protocol Outcome: Completed/Met Date Met:  11/12/13 High risk oral care Q 4 hrs Goal: Sedation Protocol initiated if indicated Outcome: Progressing Continuing with fentanyl IV and ativan prn Goal: Pneumonia/flu vaccination screen completed Outcome: Completed/Met Date Met:  11/12/13 Pneumonia given after age 88, Goal: Code status addressed with pt/family Outcome: Completed/Met Date Met:  11/12/13 Patient remains a full code at present Goal: Pain controlled with appropriate interventions Outcome: Completed/Met Date Met:  11/12/13 Pain managed with fentanyl gtt Goal: Hemodynamically stable Outcome: Progressing Titrating off of neosynephrine gtt at present Goal: Patient tolerating nututrition at goal Outcome: Not Progressing Admitted with small bowel obstruction.  Patient remains NPO with OGT to low intermittent suction

## 2013-11-12 NOTE — Progress Notes (Signed)
Respiratory Therapy:  Patient assessed during day; vent checks done; neb treatments given.  Patient not qualified for weaning due to continuation of pressors for blood pressure, continuing fever, copious secretions, slow improvement displayed in lung infection.  Patient received suctioning, changes of HME and ballard circuit connector.  Patient exhibited some irritation when turned and when ETT was adjusted; otherwise was calm.   Henrine Screws RRT

## 2013-11-12 NOTE — Progress Notes (Signed)
Subjective: He remains intubated and on the ventilator. His pressor agents were markedly decreased yesterday but during the night he became more hypotensive and had to have been turned back up. No other new changes.  Objective: Vital signs in last 24 hours: Temp:  [98.4 F (36.9 C)-100.6 F (38.1 C)] 100.6 F (38.1 C) (05/23 0730) Pulse Rate:  [83-130] 91 (05/23 0730) Resp:  [13-31] 15 (05/23 0730) BP: (76-156)/(48-95) 98/58 mmHg (05/23 0730) SpO2:  [82 %-100 %] 95 % (05/23 0730) FiO2 (%):  [40 %] 40 % (05/23 0725) Weight:  [63.2 kg (139 lb 5.3 oz)] 63.2 kg (139 lb 5.3 oz) (05/23 0430) Weight change: 7.1 kg (15 lb 10.4 oz) Last BM Date: 11/05/13  Intake/Output from previous day: 05/22 0701 - 05/23 0700 In: 3486 [I.V.:2886; NG/GT:100; IV Piggyback:450] Out: 2000 [Urine:1750; Emesis/NG output:250]  PHYSICAL EXAM General appearance: alert, cooperative, no distress and Intubated and sedated Resp: rhonchi bilaterally Cardio: regular rate and rhythm, S1, S2 normal, no murmur, click, rub or gallop GI: soft, non-tender; bowel sounds normal; no masses,  no organomegaly Extremities: extremities normal, atraumatic, no cyanosis or edema  Lab Results:    Basic Metabolic Panel:  Recent Labs  11/17/2013 0401 11/12/13 0513  NA 147 146  K 3.9 3.6*  CL 115* 114*  CO2 21 22  GLUCOSE 79 79  BUN 19 16  CREATININE 1.26 1.28  CALCIUM 7.3* 7.8*   Liver Function Tests: No results found for this basename: AST, ALT, ALKPHOS, BILITOT, PROT, ALBUMIN,  in the last 72 hours No results found for this basename: LIPASE, AMYLASE,  in the last 72 hours No results found for this basename: AMMONIA,  in the last 72 hours CBC:  Recent Labs  11/10/2013 0401 11/12/13 0513  WBC 8.0 8.1  HGB 9.3* 9.1*  HCT 28.6* 28.5*  MCV 82.9 84.1  PLT 110* 97*   Cardiac Enzymes: No results found for this basename: CKTOTAL, CKMB, CKMBINDEX, TROPONINI,  in the last 72 hours BNP:  Recent Labs  11/09/13 2247   PROBNP 1241.0*   D-Dimer: No results found for this basename: DDIMER,  in the last 72 hours CBG: No results found for this basename: GLUCAP,  in the last 72 hours Hemoglobin A1C: No results found for this basename: HGBA1C,  in the last 72 hours Fasting Lipid Panel: No results found for this basename: CHOL, HDL, LDLCALC, TRIG, CHOLHDL, LDLDIRECT,  in the last 72 hours Thyroid Function Tests: No results found for this basename: TSH, T4TOTAL, FREET4, T3FREE, THYROIDAB,  in the last 72 hours Anemia Panel: No results found for this basename: VITAMINB12, FOLATE, FERRITIN, TIBC, IRON, RETICCTPCT,  in the last 72 hours Coagulation:  Recent Labs  11/09/13 2351  LABPROT 18.1*  INR 1.54*   Urine Drug Screen: Drugs of Abuse  No results found for this basename: labopia, cocainscrnur, labbenz, amphetmu, thcu, labbarb    Alcohol Level: No results found for this basename: ETH,  in the last 72 hours Urinalysis: No results found for this basename: COLORURINE, APPERANCEUR, LABSPEC, [place], GLUCOSEU, HGBUR, BILIRUBINUR, KETONESUR, PROTEINUR, UROBILINOGEN, NITRITE, LEUKOCYTESUR,  in the last 72 hours Misc. Labs:  ABGS  Recent Labs  11/12/13 0500  PHART 7.381  PO2ART 130.0*  TCO2 18.7  HCO3 20.1   CULTURES Recent Results (from the past 240 hour(s))  MRSA PCR SCREENING     Status: None   Collection Time    11/09/13  6:15 PM      Result Value Ref Range Status  MRSA by PCR NEGATIVE  NEGATIVE Final   Comment:            The GeneXpert MRSA Assay (FDA     approved for NASAL specimens     only), is one component of a     comprehensive MRSA colonization     surveillance program. It is not     intended to diagnose MRSA     infection nor to guide or     monitor treatment for     MRSA infections.  CULTURE, BLOOD (ROUTINE X 2)     Status: None   Collection Time    11/09/13 10:47 PM      Result Value Ref Range Status   Specimen Description BLOOD LEFT WRIST   Final   Special Requests      Final   Value: BOTTLES DRAWN AEROBIC AND ANAEROBIC AEB=6CC ANA=4CC   Culture NO GROWTH 3 DAYS   Final   Report Status PENDING   Incomplete  CULTURE, BLOOD (ROUTINE X 2)     Status: None   Collection Time    11/09/13 10:47 PM      Result Value Ref Range Status   Specimen Description BLOOD CENTRAL LINE DRAWN BY RN   Final   Special Requests BOTTLES DRAWN AEROBIC AND ANAEROBIC [place]   Final   Culture NO GROWTH 3 DAYS   Final   Report Status PENDING   Incomplete   Studies/Results: Dg Chest Port 1 View  10/25/2013   CLINICAL DATA:  Respiratory failure, history hypertension, history prostate cancer  EXAM: PORTABLE CHEST - 1 VIEW  COMPARISON:  Portable exam 2637 hr compared to 11/10/2013  FINDINGS: Tip of endotracheal tube projects 4.5 cm above carinal.  Nasogastric tube extends into stomach.  RIGHT subclavian central venous catheter with tip projecting over mid SVC.  Normal heart size and mediastinal contours.  Atherosclerotic calcification aorta.  Diffuse pulmonary infiltrates bilaterally question edema or infection, slightly increased since previous exam.  No pleural effusion or pneumothorax.  Bones unremarkable.  IMPRESSION: BILATERAL pulmonary infiltrates slightly increased since previous study, question pulmonary edema versus infection.   Electronically Signed   By: Lavonia Dana M.D.   On: 11/12/2013 08:22    Medications:  Prior to Admission:  Prescriptions prior to admission  Medication Sig Dispense Refill  . acetaminophen (TYLENOL) 325 MG tablet Take 2 tablets (650 mg total) by mouth every 6 (six) hours as needed for mild pain (or Fever >/= 101).      Marland Kitchen amLODipine-benazepril (LOTREL) 5-40 MG per capsule Take 1 capsule by mouth daily.      . carvedilol (COREG) 3.125 MG tablet Take 3.125 mg by mouth daily.      . simvastatin (ZOCOR) 20 MG tablet Take 20 mg by mouth every evening.       Scheduled: . antiseptic oral rinse  15 mL Mouth Rinse QID  . chlorhexidine  15 mL Mouth Rinse BID   . enoxaparin (LOVENOX) injection  40 mg Subcutaneous Q24H  . famotidine (PEPCID) IV  20 mg Intravenous Q12H  . ipratropium-albuterol  3 mL Nebulization Q4H  . piperacillin-tazobactam (ZOSYN)  IV  3.375 g Intravenous Q8H  . sodium chloride  10-40 mL Intracatheter Q12H  . vancomycin  750 mg Intravenous Q24H   Continuous: . sodium chloride 100 mL/hr at 11/12/13 0800  . fentaNYL infusion INTRAVENOUS 100 mcg/hr (11/12/13 0800)  . phenylephrine (NEO-SYNEPHRINE) Adult infusion 20 mcg/min (11/12/13 0800)   CHY:IFOYDXAJOINOM, acetaminophen, albuterol, fentaNYL, fentaNYL, fentaNYL,  LORazepam, naLOXone (NARCAN)  injection, ondansetron, sodium chloride, sodium chloride  Assesment: He had what I believe is acute aspiration with aspiration pneumonia respiratory failure and septic shock from that. He remains intubated on the ventilator and he continues to require pressor support. He had small bowel obstruction which appears to be recurrent. Although he has made some improvement he is not ready for weaning/extubation Principal Problem:   Septic shock Active Problems:   SBO (small bowel obstruction)   HTN (hypertension), benign   Acute respiratory failure with hypoxia   Acute encephalopathy   Aspiration pneumonia   Small bowel obstruction    Plan: Continue current treatments. Once she is able to come off of pressors we can look again at potential for weaning.    LOS: 4 days   Alonza Bogus 11/12/2013, 9:34 AM

## 2013-11-12 NOTE — Progress Notes (Signed)
Echocardiogram 2D Echocardiogram has been performed.  Doyle Askew 11/12/2013, 3:40 PM

## 2013-11-12 NOTE — Progress Notes (Addendum)
WHEN REVIEWING TODAYS CXR REPORT, IT SAID THAT PT HAD BEEN EXTUBATED. DR HASSEL,[facility], CALLED. NEW REPEAT CRX ORDERED TO BE SLIGHTLY HIGHER TO BETTER VIEW ETT. AND VERIFY PLACEMENT.

## 2013-11-13 ENCOUNTER — Inpatient Hospital Stay (HOSPITAL_COMMUNITY): Payer: Medicare Other

## 2013-11-13 DIAGNOSIS — E43 Unspecified severe protein-calorie malnutrition: Secondary | ICD-10-CM | POA: Diagnosis present

## 2013-11-13 LAB — CBC
HCT: 25.9 % — ABNORMAL LOW (ref 39.0–52.0)
HEMOGLOBIN: 8.4 g/dL — AB (ref 13.0–17.0)
MCH: 26.8 pg (ref 26.0–34.0)
MCHC: 32.4 g/dL (ref 30.0–36.0)
MCV: 82.5 fL (ref 78.0–100.0)
Platelets: 102 10*3/uL — ABNORMAL LOW (ref 150–400)
RBC: 3.14 MIL/uL — ABNORMAL LOW (ref 4.22–5.81)
RDW: 14.7 % (ref 11.5–15.5)
WBC: 6.3 10*3/uL (ref 4.0–10.5)

## 2013-11-13 LAB — COMPREHENSIVE METABOLIC PANEL
ALBUMIN: 1.9 g/dL — AB (ref 3.5–5.2)
ALK PHOS: 44 U/L (ref 39–117)
ALT: 8 U/L (ref 0–53)
AST: 13 U/L (ref 0–37)
BUN: 14 mg/dL (ref 6–23)
CO2: 23 mEq/L (ref 19–32)
Calcium: 7.9 mg/dL — ABNORMAL LOW (ref 8.4–10.5)
Chloride: 113 mEq/L — ABNORMAL HIGH (ref 96–112)
Creatinine, Ser: 1.13 mg/dL (ref 0.50–1.35)
GFR calc Af Amer: 69 mL/min — ABNORMAL LOW (ref >90)
GFR calc non Af Amer: 60 mL/min — ABNORMAL LOW (ref >90)
GLUCOSE: 86 mg/dL (ref 70–99)
POTASSIUM: 3.5 meq/L — AB (ref 3.7–5.3)
Sodium: 145 mEq/L (ref 137–147)
Total Bilirubin: 0.4 mg/dL (ref 0.3–1.2)
Total Protein: 4.8 g/dL — ABNORMAL LOW (ref 6.0–8.3)

## 2013-11-13 LAB — BLOOD GAS, ARTERIAL
Acid-base deficit: 6.1 mmol/L — ABNORMAL HIGH (ref 0.0–2.0)
BICARBONATE: 18.2 meq/L — AB (ref 20.0–24.0)
Drawn by: 317771
FIO2: 0.4 %
MECHVT: 500 mL
O2 Saturation: 95.4 %
PATIENT TEMPERATURE: 37
PEEP: 5 cmH2O
PH ART: 7.366 (ref 7.350–7.450)
PO2 ART: 75.4 mmHg — AB (ref 80.0–100.0)
RATE: 15 resp/min
TCO2: 16.6 mmol/L (ref 0–100)
pCO2 arterial: 32.6 mmHg — ABNORMAL LOW (ref 35.0–45.0)

## 2013-11-13 LAB — HEMOGLOBIN AND HEMATOCRIT, BLOOD
HCT: 27.8 % — ABNORMAL LOW (ref 39.0–52.0)
Hemoglobin: 9 g/dL — ABNORMAL LOW (ref 13.0–17.0)

## 2013-11-13 MED ORDER — POTASSIUM CHLORIDE 10 MEQ/100ML IV SOLN
10.0000 meq | INTRAVENOUS | Status: AC
  Administered 2013-11-13 (×5): 10 meq via INTRAVENOUS
  Filled 2013-11-13 (×2): qty 100

## 2013-11-13 MED ORDER — VANCOMYCIN HCL IN DEXTROSE 750-5 MG/150ML-% IV SOLN
750.0000 mg | Freq: Two times a day (BID) | INTRAVENOUS | Status: DC
  Administered 2013-11-13 – 2013-11-18 (×10): 750 mg via INTRAVENOUS
  Filled 2013-11-13 (×17): qty 150

## 2013-11-13 MED ORDER — FENTANYL CITRATE 0.05 MG/ML IJ SOLN
INTRAMUSCULAR | Status: AC
  Filled 2013-11-13: qty 50

## 2013-11-13 NOTE — Progress Notes (Signed)
Pressors have been to off since 2345.

## 2013-11-13 NOTE — Progress Notes (Signed)
Subjective: He has successfully been taken off pressors. However his hemoglobin level has dropped almost 1 g. His secretions are becoming more problematic and he is having to be suctioned more frequently and occasionally have bag and lavage treatment  Objective: Vital signs in last 24 hours: Temp:  [98.4 F (36.9 C)-100.6 F (38.1 C)] 98.4 F (36.9 C) (05/24 0300) Pulse Rate:  [71-120] 92 (05/24 0600) Resp:  [13-23] 15 (05/24 0600) BP: (82-138)/(54-94) 95/59 mmHg (05/24 0600) SpO2:  [91 %-100 %] 100 % (05/24 0718) FiO2 (%):  [40 %] 40 % (05/24 0719) Weight:  [69.1 kg (152 lb 5.4 oz)] 69.1 kg (152 lb 5.4 oz) (05/24 0432) Weight change: 5.9 kg (13 lb 0.1 oz) Last BM Date: 11/12/13  Intake/Output from previous day: 05/23 0701 - 05/24 0700 In: 2130.3 [I.V.:1330.3; IV Piggyback:800] Out: 1925 [Urine:1625; Emesis/NG output:300]  PHYSICAL EXAM General appearance: He remains intubated sedated and on mechanical ventilation. Resp: rhonchi bilaterally Cardio: regular rate and rhythm, S1, S2 normal, no murmur, click, rub or gallop GI: His abdomen is still firm with hypoactive bowel sounds Extremities: extremities normal, atraumatic, no cyanosis or edema  Lab Results:    Basic Metabolic Panel:  Recent Labs  11/12/13 0513 11/13/13 0518  NA 146 145  K 3.6* 3.5*  CL 114* 113*  CO2 22 23  GLUCOSE 79 86  BUN 16 14  CREATININE 1.28 1.13  CALCIUM 7.8* 7.9*   Liver Function Tests:  Recent Labs  11/13/13 0518  AST 13  ALT 8  ALKPHOS 44  BILITOT 0.4  PROT 4.8*  ALBUMIN 1.9*   No results found for this basename: LIPASE, AMYLASE,  in the last 72 hours No results found for this basename: AMMONIA,  in the last 72 hours CBC:  Recent Labs  11/12/13 0513 11/13/13 0518  WBC 8.1 6.3  HGB 9.1* 8.4*  HCT 28.5* 25.9*  MCV 84.1 82.5  PLT 97* 102*   Cardiac Enzymes: No results found for this basename: CKTOTAL, CKMB, CKMBINDEX, TROPONINI,  in the last 72 hours BNP:  Recent  Labs  11/12/13 0500  PROBNP 529.2*   D-Dimer: No results found for this basename: DDIMER,  in the last 72 hours CBG: No results found for this basename: GLUCAP,  in the last 72 hours Hemoglobin A1C: No results found for this basename: HGBA1C,  in the last 72 hours Fasting Lipid Panel: No results found for this basename: CHOL, HDL, LDLCALC, TRIG, CHOLHDL, LDLDIRECT,  in the last 72 hours Thyroid Function Tests: No results found for this basename: TSH, T4TOTAL, FREET4, T3FREE, THYROIDAB,  in the last 72 hours Anemia Panel: No results found for this basename: VITAMINB12, FOLATE, FERRITIN, TIBC, IRON, RETICCTPCT,  in the last 72 hours Coagulation: No results found for this basename: LABPROT, INR,  in the last 72 hours Urine Drug Screen: Drugs of Abuse  No results found for this basename: labopia, cocainscrnur, labbenz, amphetmu, thcu, labbarb    Alcohol Level: No results found for this basename: ETH,  in the last 72 hours Urinalysis: No results found for this basename: COLORURINE, APPERANCEUR, LABSPEC, PHURINE, GLUCOSEU, HGBUR, BILIRUBINUR, KETONESUR, PROTEINUR, UROBILINOGEN, NITRITE, LEUKOCYTESUR,  in the last 72 hours Misc. Labs:  ABGS  Recent Labs  11/13/13 0500  PHART 7.366  PO2ART 75.4*  TCO2 16.6  HCO3 18.2*   CULTURES Recent Results (from the past 240 hour(s))  MRSA PCR SCREENING     Status: None   Collection Time    11/09/13  6:15 PM  Result Value Ref Range Status   MRSA by PCR NEGATIVE  NEGATIVE Final   Comment:            The GeneXpert MRSA Assay (FDA     approved for NASAL specimens     only), is one component of a     comprehensive MRSA colonization     surveillance program. It is not     intended to diagnose MRSA     infection nor to guide or     monitor treatment for     MRSA infections.  CULTURE, BLOOD (ROUTINE X 2)     Status: None   Collection Time    11/09/13 10:47 PM      Result Value Ref Range Status   Specimen Description BLOOD LEFT  WRIST   Final   Special Requests     Final   Value: BOTTLES DRAWN AEROBIC AND ANAEROBIC AEB=6CC ANA=4CC   Culture NO GROWTH 3 DAYS   Final   Report Status PENDING   Incomplete  CULTURE, BLOOD (ROUTINE X 2)     Status: None   Collection Time    11/09/13 10:47 PM      Result Value Ref Range Status   Specimen Description BLOOD CENTRAL LINE DRAWN BY RN   Final   Special Requests BOTTLES DRAWN AEROBIC AND ANAEROBIC 8CC EACH   Final   Culture NO GROWTH 3 DAYS   Final   Report Status PENDING   Incomplete   Studies/Results: Dg Chest 1v Repeat Same Day  11/12/2013   CLINICAL DATA:  Endotracheal tube not visible on prior study  EXAM: CHEST - 1 VIEW SAME DAY  COMPARISON:  Earlier film of the same day  FINDINGS: Endotracheal tube is now more conspicuous, tip 5.3 cm above carina. Endotracheal tube and central line are stable in position. Perihilar interstitial and alveolar edema or infiltrates slightly improved. Heart size remains normal. Atheromatous aorta. . No effusion. Visualized skeletal structures are unremarkable.  IMPRESSION: 1. Endotracheal tube tip 5.3 cm above carina. 2. Continued  improvement in perihilar edema/infiltrates.   Electronically Signed   By: Arne Cleveland M.D.   On: 11/12/2013 14:05   Dg Chest Port 1 View  11/12/2013   CLINICAL DATA:  Respiratory failure  EXAM: PORTABLE CHEST - 1 VIEW  COMPARISON:  the previous day's study  FINDINGS: The patient has been extubated. The nasogastric tube and right central line are stable in position. Bilateral edema/ infiltrates in a predominantly perihilar distribution, slightly improved. No definite effusion. Heart size normal. Atheromatous aorta. .  Visualized skeletal structures are unremarkable.  IMPRESSION: 1. Interval extubation with improvement in bilateral edema/infiltrates.   Electronically Signed   By: Arne Cleveland M.D.   On: 11/12/2013 10:56    Medications:  Prior to Admission:  Prescriptions prior to admission  Medication Sig  Dispense Refill  . acetaminophen (TYLENOL) 325 MG tablet Take 2 tablets (650 mg total) by mouth every 6 (six) hours as needed for mild pain (or Fever >/= 101).      Marland Kitchen amLODipine-benazepril (LOTREL) 5-40 MG per capsule Take 1 capsule by mouth daily.      . carvedilol (COREG) 3.125 MG tablet Take 3.125 mg by mouth daily.      . simvastatin (ZOCOR) 20 MG tablet Take 20 mg by mouth every evening.       Scheduled: . antiseptic oral rinse  15 mL Mouth Rinse QID  . chlorhexidine  15 mL Mouth Rinse BID  . enoxaparin (  LOVENOX) injection  40 mg Subcutaneous Q24H  . famotidine (PEPCID) IV  20 mg Intravenous Q12H  . ipratropium-albuterol  3 mL Nebulization Q4H  . piperacillin-tazobactam (ZOSYN)  IV  3.375 g Intravenous Q8H  . sodium chloride  10-40 mL Intracatheter Q12H  . vancomycin  750 mg Intravenous Q24H   Continuous: . sodium chloride 50 mL/hr at 11/13/13 0600  . fentaNYL infusion INTRAVENOUS Stopped (11/13/13 2876)  . phenylephrine (NEO-SYNEPHRINE) Adult infusion Stopped (11/12/13 2335)   OTL:[id number], acetaminophen, albuterol, fentaNYL, fentaNYL, fentaNYL, LORazepam, naLOXone (NARCAN)  injection, ondansetron, sodium chloride, sodium chloride  Assesment: He was admitted with small bowel obstruction. He aspirated has developed aspiration pneumonia acute respiratory failure requiring ventilator support and septic shock. This morning his hemoglobin level has dropped. He is off pressors. He has more trouble with secretions today. This has been increasing over the last 2 or 3 days Principal Problem:   Septic shock Active Problems:   SBO (small bowel obstruction)   HTN (hypertension), benign   Acute respiratory failure with hypoxia   Acute encephalopathy   Aspiration pneumonia   Small bowel obstruction    Plan: Although is off pressors and could potentially be a candidate for weaning and extubation I'm hesitant to proceed because of the drop in hemoglobin level and the potential that he  may need blood transfusion EGD et Ronney Asters. He may not be able to be extubated simple because of his secretions as well. Therefore this time I'm not going to attempt weaning and extubation but will follow closely    LOS: 5 days   Alonza Bogus 11/13/2013, 7:26 AM

## 2013-11-13 NOTE — Progress Notes (Signed)
ANTIBIOTIC CONSULT NOTE - follow up  Pharmacy Consult for Zosyn & Vancomycin Indication: Aspiration Pneumonia  Allergies  Allergen Reactions  . Solu-Medrol [Methylprednisolone Acetate] Other (See Comments)    Severe encephalopathy/delirium.   Patient Measurements: Height: 5\' 1"  (154.9 cm) Weight: 152 lb 5.4 oz (69.1 kg) IBW/kg (Calculated) : 52.3  Vital Signs: Temp: 98.6 F (37 C) (05/24 0715) Temp src: Axillary (05/24 0715) BP: 117/67 mmHg (05/24 0730) Pulse Rate: 86 (05/24 0730) Intake/Output from previous day: 05/23 0701 - 05/24 0700 In: 2190.3 [I.V.:1390.3; IV Piggyback:800] Out: 1925 [Urine:1625; Emesis/NG output:300] Intake/Output from this shift: Total I/O In: 60 [I.V.:60] Out: -   Labs:  Recent Labs  11/15/2013 0401 11/12/13 0513 11/13/13 0518  WBC 8.0 8.1 6.3  HGB 9.3* 9.1* 8.4*  PLT 110* 97* 102*  CREATININE 1.26 1.28 1.13   Estimated Creatinine Clearance: 44.2 ml/min (by C-G formula based on Cr of 1.13).  Recent Labs  11/12/13 1700  VANCORANDOM 5.5    Microbiology: Recent Results (from the past 720 hour(s))  MRSA PCR SCREENING     Status: None   Collection Time    11/09/13  6:15 PM      Result Value Ref Range Status   MRSA by PCR NEGATIVE  NEGATIVE Final   Comment:            The GeneXpert MRSA Assay (FDA     approved for NASAL specimens     only), is one component of a     comprehensive MRSA colonization     surveillance program. It is not     intended to diagnose MRSA     infection nor to guide or     monitor treatment for     MRSA infections.  CULTURE, BLOOD (ROUTINE X 2)     Status: None   Collection Time    11/09/13 10:47 PM      Result Value Ref Range Status   Specimen Description BLOOD LEFT WRIST   Final   Special Requests     Final   Value: BOTTLES DRAWN AEROBIC AND ANAEROBIC AEB=6CC ANA=4CC   Culture NO GROWTH 4 DAYS   Final   Report Status PENDING   Incomplete  CULTURE, BLOOD (ROUTINE X 2)     Status: None   Collection  Time    11/09/13 10:47 PM      Result Value Ref Range Status   Specimen Description BLOOD CENTRAL LINE DRAWN BY RN   Final   Special Requests BOTTLES DRAWN AEROBIC AND ANAEROBIC 8CC EACH   Final   Culture NO GROWTH 4 DAYS   Final   Report Status PENDING   Incomplete   Medical History: Past Medical History  Diagnosis Date  . Hypertension   . Hypercholesterolemia   . Cancer     prostate   Medications:  Scheduled:  . antiseptic oral rinse  15 mL Mouth Rinse QID  . chlorhexidine  15 mL Mouth Rinse BID  . enoxaparin (LOVENOX) injection  40 mg Subcutaneous Q24H  . famotidine (PEPCID) IV  20 mg Intravenous Q12H  . ipratropium-albuterol  3 mL Nebulization Q4H  . piperacillin-tazobactam (ZOSYN)  IV  3.375 g Intravenous Q8H  . sodium chloride  10-40 mL Intracatheter Q12H  . vancomycin  750 mg Intravenous Q12H   Assessment: Zosyn & Vancomycin for aspiration pneumonia Estimated Creatinine Clearance: 44.2 ml/min (by C-G formula based on Cr of 1.13).    SCr stable.  Afebrile w/ normal WBC Trough level is below  goal.  Goal of Therapy:  Vancomycin trough level 15-20 mcg/ml  Plan: Increase Vancomycin to 750 mg IV every 12 hours Recheck Vancomycin trough at steady state  Zosyn 3.375 GM IV every 8 hours, each dose over 4 hours Monitor renal function Labs per protocol  [employer] 11/13/2013,8:55 AM

## 2013-11-13 NOTE — Progress Notes (Signed)
[facility] PROGRESS NOTE  Douglas Mckay YBW:[id number] DOB: 08/24/1931 DOA: 12/06/2013 PCP: Marjo Bicker, MD  Assessment/Plan: 1. Septic shock. Likely related to aspiration pneumonia. Patient was aggressively hydrated with IV fluids and required vasopressor support.  He has now been weaned off of phenylephrine. He is on broad-spectrum antibiotics. Blood cultures have shown no growth to date.  He has been afebrile over the last 24 hours. Continue current treatments. 2. Aspiration pneumonia. Likely related to bowel obstruction. He is on vancomycin and zosyn. Continue current treatments.  Chest xray shows improving bilateral infiltrates.  bnp trending down. EF in normal range on echo. 3. Acute respiratory failure, likely due to pneumonia. Patient required intubation on 5/20 when he failed bipap treatment. Pulmonology following. Will try and wean from vent as tolerated. Major barrier at this point is thick/copius secretions. 4. Small bowel obstruction with right inguinal hernia. Surgery is following.  Continuing NG tube decompression. 5. Acute encephalopathy. Likely related to acute illness.  Will re evaluate once patient is able to extubate. 6. Hypokalemia, replace. 7. Anemia.  No evidence of melena/hematochezia/hematemesis. Likely related to critical illness.  Will continue to follow hemoglobin.   Code Status: full code Family Communication: discussed with wife at the bedside Disposition Plan: home vs. Snf, pending hospital course   Consultants:  Gen Surgery  Pulmonology  Procedures:  Intubation 5/20>>  Central line placement 5/20 Echo: - Left ventricle: The cavity size was normal. Wall thickness was normal. Systolic function was vigorous. The estimated ejection fraction was in the range of 65% to 70%. Wall motion was normal; there were no regional wall motion abnormalities. - Right ventricle: The cavity size was mildly dilated. - Impressions: Technically limited study  due to poor sound wave transmission.    Antibiotics:  Vancomycin 5/20>>  Zosyn 5/20>>  HPI/Subjective: Sedated and intubated  Objective: Filed Vitals:   11/13/13 0730  BP: 117/67  Pulse: 86  Temp:   Resp: 16    Intake/Output Summary (Last 24 hours) at 11/13/13 0901 Last data filed at 11/13/13 0800  Gross per 24 hour  Intake 2017.16 ml  Output   1925 ml  Net  92.16 ml   Filed Weights   11/17/2013 0600 11/12/13 0430 11/13/13 0432  Weight: 56.1 kg (123 lb 10.9 oz) 63.2 kg (139 lb 5.3 oz) 69.1 kg (152 lb 5.4 oz)    Exam:   General:  Sedated, intubated on vent  Cardiovascular: S1, S2 RRR  Respiratory: rhonchi bilaterally  Abdomen: soft, nt, nd, bs+  Musculoskeletal:  No pedal edema b/l   Data Reviewed: Basic Metabolic Panel:  Recent Labs Lab 11/09/13 2247 11/10/13 0443 10/23/2013 0401 11/12/13 0513 11/13/13 0518  NA 146 147 147 146 145  K 4.0 3.6* 3.9 3.6* 3.5*  CL 110 112 115* 114* 113*  CO2 23 21 21 22 23   GLUCOSE 107* 100* 79 79 86  BUN 23 22 19 16 14   CREATININE 1.28 1.20 1.26 1.28 1.13  CALCIUM 7.6* 7.1* 7.3* 7.8* 7.9*   Liver Function Tests:  Recent Labs Lab 2013-12-06 2222 11/13/13 0518  AST 22 13  ALT 16 8  ALKPHOS 65 44  BILITOT 0.4 0.4  PROT 7.5 4.8*  ALBUMIN 4.3 1.9*    Recent Labs Lab 12-06-13 2222  LIPASE 22   No results found for this basename: AMMONIA,  in the last 168 hours CBC:  Recent Labs Lab 2013-12-06 2222 11/09/13 2247 11/10/13 0443 11/06/2013 0401 11/12/13 0513 11/13/13 0518  WBC 3.5* 2.0* 3.9* 8.0 8.1  6.3  NEUTROABS 3.1  --   --   --   --   --   HGB 12.4* 11.0* 10.0* 9.3* 9.1* 8.4*  HCT 39.4 34.5* 31.6* 28.6* 28.5* 25.9*  MCV 84.5 84.4 85.9 82.9 84.1 82.5  PLT 152 107* 108* 110* 97* 102*   Cardiac Enzymes: No results found for this basename: CKTOTAL, CKMB, CKMBINDEX, TROPONINI,  in the last 168 hours BNP (last 3 results)  Recent Labs  11/09/13 2247 11/12/13 0500  PROBNP 1241.0* 529.2*   CBG: No  results found for this basename: GLUCAP,  in the last 168 hours  Recent Results (from the past 240 hour(s))  MRSA PCR SCREENING     Status: None   Collection Time    11/09/13  6:15 PM      Result Value Ref Range Status   MRSA by PCR NEGATIVE  NEGATIVE Final   Comment:            The GeneXpert MRSA Assay (FDA     approved for NASAL specimens     only), is one component of a     comprehensive MRSA colonization     surveillance program. It is not     intended to diagnose MRSA     infection nor to guide or     monitor treatment for     MRSA infections.  CULTURE, BLOOD (ROUTINE X 2)     Status: None   Collection Time    11/09/13 10:47 PM      Result Value Ref Range Status   Specimen Description BLOOD LEFT WRIST   Final   Special Requests     Final   Value: BOTTLES DRAWN AEROBIC AND ANAEROBIC AEB=6CC ANA=4CC   Culture NO GROWTH 4 DAYS   Final   Report Status PENDING   Incomplete  CULTURE, BLOOD (ROUTINE X 2)     Status: None   Collection Time    11/09/13 10:47 PM      Result Value Ref Range Status   Specimen Description BLOOD CENTRAL LINE DRAWN BY RN   Final   Special Requests BOTTLES DRAWN AEROBIC AND ANAEROBIC 8CC EACH   Final   Culture NO GROWTH 4 DAYS   Final   Report Status PENDING   Incomplete     Studies: Dg Chest 1v Repeat Same Day  11/12/2013   CLINICAL DATA:  Endotracheal tube not visible on prior study  EXAM: CHEST - 1 VIEW SAME DAY  COMPARISON:  Earlier film of the same day  FINDINGS: Endotracheal tube is now more conspicuous, tip 5.3 cm above carina. Endotracheal tube and central line are stable in position. Perihilar interstitial and alveolar edema or infiltrates slightly improved. Heart size remains normal. Atheromatous aorta. . No effusion. Visualized skeletal structures are unremarkable.  IMPRESSION: 1. Endotracheal tube tip 5.3 cm above carina. 2. Continued  improvement in perihilar edema/infiltrates.   Electronically Signed   By: Arne Cleveland M.D.   On:  11/12/2013 14:05   Dg Chest Port 1 View  11/12/2013   CLINICAL DATA:  Respiratory failure  EXAM: PORTABLE CHEST - 1 VIEW  COMPARISON:  the previous day's study  FINDINGS: The patient has been extubated. The nasogastric tube and right central line are stable in position. Bilateral edema/ infiltrates in a predominantly perihilar distribution, slightly improved. No definite effusion. Heart size normal. Atheromatous aorta. .  Visualized skeletal structures are unremarkable.  IMPRESSION: 1. Interval extubation with improvement in bilateral edema/infiltrates.   Electronically Signed  By: Arne Cleveland M.D.   On: 11/12/2013 10:56    Scheduled Meds: . antiseptic oral rinse  15 mL Mouth Rinse QID  . chlorhexidine  15 mL Mouth Rinse BID  . enoxaparin (LOVENOX) injection  40 mg Subcutaneous Q24H  . famotidine (PEPCID) IV  20 mg Intravenous Q12H  . ipratropium-albuterol  3 mL Nebulization Q4H  . piperacillin-tazobactam (ZOSYN)  IV  3.375 g Intravenous Q8H  . sodium chloride  10-40 mL Intracatheter Q12H  . vancomycin  750 mg Intravenous Q12H   Continuous Infusions: . sodium chloride 50 mL/hr at 11/13/13 0800  . fentaNYL infusion INTRAVENOUS 100 mcg/hr (11/13/13 0800)  . phenylephrine (NEO-SYNEPHRINE) Adult infusion Stopped (11/12/13 2335)    Principal Problem:   Septic shock Active Problems:   SBO (small bowel obstruction)   HTN (hypertension), benign   Acute respiratory failure with hypoxia   Acute encephalopathy   Aspiration pneumonia   Small bowel obstruction    Time spent: critical care: 67mins    Trip Cavanagh  [facility] Pager [phone]. If 7PM-7AM, please contact night-coverage at [email or web address], password [facility] 11/13/2013, 9:01 AM  LOS: 5 days

## 2013-11-14 ENCOUNTER — Inpatient Hospital Stay (HOSPITAL_COMMUNITY): Payer: Medicare Other

## 2013-11-14 LAB — CBC
HEMATOCRIT: 26.8 % — AB (ref 39.0–52.0)
HEMOGLOBIN: 8.6 g/dL — AB (ref 13.0–17.0)
MCH: 26.7 pg (ref 26.0–34.0)
MCHC: 32.1 g/dL (ref 30.0–36.0)
MCV: 83.2 fL (ref 78.0–100.0)
Platelets: 112 10*3/uL — ABNORMAL LOW (ref 150–400)
RBC: 3.22 MIL/uL — AB (ref 4.22–5.81)
RDW: 14.7 % (ref 11.5–15.5)
WBC: 5.2 10*3/uL (ref 4.0–10.5)

## 2013-11-14 LAB — BLOOD GAS, ARTERIAL
ACID-BASE DEFICIT: 4 mmol/L — AB (ref 0.0–2.0)
Bicarbonate: 20.6 mEq/L (ref 20.0–24.0)
DRAWN BY: 317771
FIO2: 0.4 %
LHR: 15 {breaths}/min
O2 Saturation: 96 %
PATIENT TEMPERATURE: 37
PCO2 ART: 38.2 mmHg (ref 35.0–45.0)
PEEP: 5 cmH2O
TCO2: 19 mmol/L (ref 0–100)
VT: 500 mL
pH, Arterial: 7.351 (ref 7.350–7.450)
pO2, Arterial: 81.7 mmHg (ref 80.0–100.0)

## 2013-11-14 LAB — CULTURE, BLOOD (ROUTINE X 2)
CULTURE: NO GROWTH
CULTURE: NO GROWTH

## 2013-11-14 LAB — BASIC METABOLIC PANEL
BUN: 11 mg/dL (ref 6–23)
CHLORIDE: 109 meq/L (ref 96–112)
CO2: 22 meq/L (ref 19–32)
Calcium: 7.9 mg/dL — ABNORMAL LOW (ref 8.4–10.5)
Creatinine, Ser: 1 mg/dL (ref 0.50–1.35)
GFR calc Af Amer: 80 mL/min — ABNORMAL LOW (ref >90)
GFR calc non Af Amer: 69 mL/min — ABNORMAL LOW (ref >90)
GLUCOSE: 95 mg/dL (ref 70–99)
POTASSIUM: 3.7 meq/L (ref 3.7–5.3)
Sodium: 144 mEq/L (ref 137–147)

## 2013-11-14 LAB — MAGNESIUM: Magnesium: 1.7 mg/dL (ref 1.5–2.5)

## 2013-11-14 MED ORDER — FAT EMULSION 20 % IV EMUL
250.0000 mL | INTRAVENOUS | Status: AC
  Administered 2013-11-14: 250 mL via INTRAVENOUS
  Filled 2013-11-14: qty 250

## 2013-11-14 MED ORDER — TRACE MINERALS CR-CU-F-FE-I-MN-MO-SE-ZN IV SOLN
INTRAVENOUS | Status: AC
  Administered 2013-11-14: 18:00:00 via INTRAVENOUS
  Filled 2013-11-14: qty 2000

## 2013-11-14 MED ORDER — FUROSEMIDE 10 MG/ML IJ SOLN
20.0000 mg | Freq: Once | INTRAMUSCULAR | Status: AC
  Administered 2013-11-14: 20 mg via INTRAVENOUS
  Filled 2013-11-14: qty 2

## 2013-11-14 NOTE — Progress Notes (Signed)
Subjective: Please see note from respiratory therapy. I agree we should try again for weaning but give him Ativan prior to the attempt. He had been very agitated in the past when his sedation was reduced  Objective: Vital signs in last 24 hours: Temp:  [98.1 F (36.7 C)-99.1 F (37.3 C)] 98.1 F (36.7 C) (05/25 0741) Pulse Rate:  [25-134] 46 (05/25 0746) Resp:  [14-33] 16 (05/25 0815) BP: (98-170)/(55-105) 128/69 mmHg (05/25 0815) SpO2:  [79 %-100 %] 100 % (05/25 0730) FiO2 (%):  [40 %] 40 % (05/25 0747) Weight:  [70 kg (154 lb 5.2 oz)] 70 kg (154 lb 5.2 oz) (05/25 0330) Weight change: 0.9 kg (1 lb 15.7 oz) Last BM Date: 11/12/13  Intake/Output from previous day: 05/24 0701 - 05/25 0700 In: 2085 [I.V.:1035; IV Piggyback:1050] Out: 2100 [Urine:1850; Emesis/NG output:250]  PHYSICAL EXAM General appearance: Intubated sedated and on mechanical ventilation Resp: rhonchi bilaterally Cardio: regular rate and rhythm, S1, S2 normal, no murmur, click, rub or gallop GI: His abdomen is still distended with hypoactive bowel Extremities: extremities normal, atraumatic, no cyanosis or edema  Lab Results:    Basic Metabolic Panel:  Recent Labs  11/13/13 0518 11/14/13 0459  NA 145 144  K 3.5* 3.7  CL 113* 109  CO2 23 22  GLUCOSE 86 95  BUN 14 11  CREATININE 1.13 1.00  CALCIUM 7.9* 7.9*  MG  --  1.7   Liver Function Tests:  Recent Labs  11/13/13 0518  AST 13  ALT 8  ALKPHOS 44  BILITOT 0.4  PROT 4.8*  ALBUMIN 1.9*   No results found for this basename: LIPASE, AMYLASE,  in the last 72 hours No results found for this basename: AMMONIA,  in the last 72 hours CBC:  Recent Labs  11/13/13 0518 11/13/13 1450 11/14/13 0459  WBC 6.3  --  5.2  HGB 8.4* 9.0* 8.6*  HCT 25.9* 27.8* 26.8*  MCV 82.5  --  83.2  PLT 102*  --  112*   Cardiac Enzymes: No results found for this basename: CKTOTAL, CKMB, CKMBINDEX, TROPONINI,  in the last 72 hours BNP:  Recent Labs   11/12/13 0500  PROBNP 529.2*   D-Dimer: No results found for this basename: DDIMER,  in the last 72 hours CBG: No results found for this basename: GLUCAP,  in the last 72 hours Hemoglobin A1C: No results found for this basename: HGBA1C,  in the last 72 hours Fasting Lipid Panel: No results found for this basename: CHOL, HDL, LDLCALC, TRIG, CHOLHDL, LDLDIRECT,  in the last 72 hours Thyroid Function Tests: No results found for this basename: TSH, T4TOTAL, FREET4, T3FREE, THYROIDAB,  in the last 72 hours Anemia Panel: No results found for this basename: VITAMINB12, FOLATE, FERRITIN, TIBC, IRON, RETICCTPCT,  in the last 72 hours Coagulation: No results found for this basename: LABPROT, INR,  in the last 72 hours Urine Drug Screen: Drugs of Abuse  No results found for this basename: labopia, cocainscrnur, labbenz, amphetmu, thcu, labbarb    Alcohol Level: No results found for this basename: ETH,  in the last 72 hours Urinalysis: No results found for this basename: COLORURINE, APPERANCEUR, LABSPEC, PHURINE, GLUCOSEU, HGBUR, BILIRUBINUR, KETONESUR, PROTEINUR, UROBILINOGEN, NITRITE, LEUKOCYTESUR,  in the last 72 hours Misc. Labs:  ABGS  Recent Labs  11/14/13 0500  PHART 7.351  PO2ART 81.7  TCO2 19.0  HCO3 20.6   CULTURES Recent Results (from the past 240 hour(s))  MRSA PCR SCREENING     Status: None  Collection Time    11/09/13  6:15 PM      Result Value Ref Range Status   MRSA by PCR NEGATIVE  NEGATIVE Final   Comment:            The GeneXpert MRSA Assay (FDA     approved for NASAL specimens     only), is one component of a     comprehensive MRSA colonization     surveillance program. It is not     intended to diagnose MRSA     infection nor to guide or     monitor treatment for     MRSA infections.  CULTURE, BLOOD (ROUTINE X 2)     Status: None   Collection Time    11/09/13 10:47 PM      Result Value Ref Range Status   Specimen Description BLOOD LEFT WRIST    Final   Special Requests     Final   Value: BOTTLES DRAWN AEROBIC AND ANAEROBIC AEB=6CC ANA=4CC   Culture NO GROWTH 4 DAYS   Final   Report Status PENDING   Incomplete  CULTURE, BLOOD (ROUTINE X 2)     Status: None   Collection Time    11/09/13 10:47 PM      Result Value Ref Range Status   Specimen Description BLOOD CENTRAL LINE DRAWN BY RN   Final   Special Requests BOTTLES DRAWN AEROBIC AND ANAEROBIC 8CC EACH   Final   Culture NO GROWTH 4 DAYS   Final   Report Status PENDING   Incomplete   Studies/Results: Dg Chest 1v Repeat Same Day  11/12/2013   CLINICAL DATA:  Endotracheal tube not visible on prior study  EXAM: CHEST - 1 VIEW SAME DAY  COMPARISON:  Earlier film of the same day  FINDINGS: Endotracheal tube is now more conspicuous, tip 5.3 cm above carina. Endotracheal tube and central line are stable in position. Perihilar interstitial and alveolar edema or infiltrates slightly improved. Heart size remains normal. Atheromatous aorta. . No effusion. Visualized skeletal structures are unremarkable.  IMPRESSION: 1. Endotracheal tube tip 5.3 cm above carina. 2. Continued  improvement in perihilar edema/infiltrates.   Electronically Signed   By: Arne Cleveland M.D.   On: 11/12/2013 14:05   Dg Chest Port 1 View  11/13/2013   CLINICAL DATA:  Respiratory failure.  EXAM: PORTABLE CHEST - 1 VIEW  COMPARISON:  Nov 12, 2013.  FINDINGS: Stable cardiomediastinal silhouette. Endotracheal tube is in grossly good position with distal tip 5 cm above the Carina. Nasogastric tube is seen passing through the esophagus into the stomach. Right subclavian catheter line is unchanged in position with tip in expected position of the SVC. No pneumothorax or significant pleural effusion is noted. Stable bilateral perihilar opacities are noted concerning for edema or possibly pneumonia. Bony thorax is intact.  IMPRESSION: Stable support apparatus. Stable bilateral perihilar opacities are noted concerning for edema or  pneumonia.   Electronically Signed   By: Sabino Dick M.D.   On: 11/13/2013 09:29    Medications:  Prior to Admission:  Prescriptions prior to admission  Medication Sig Dispense Refill  . acetaminophen (TYLENOL) 325 MG tablet Take 2 tablets (650 mg total) by mouth every 6 (six) hours as needed for mild pain (or Fever >/= 101).      Marland Kitchen amLODipine-benazepril (LOTREL) 5-40 MG per capsule Take 1 capsule by mouth daily.      . carvedilol (COREG) 3.125 MG tablet Take 3.125 mg by mouth daily.      Marland Kitchen  simvastatin (ZOCOR) 20 MG tablet Take 20 mg by mouth every evening.       Scheduled: . antiseptic oral rinse  15 mL Mouth Rinse QID  . chlorhexidine  15 mL Mouth Rinse BID  . enoxaparin (LOVENOX) injection  40 mg Subcutaneous Q24H  . famotidine (PEPCID) IV  20 mg Intravenous Q12H  . ipratropium-albuterol  3 mL Nebulization Q4H  . piperacillin-tazobactam (ZOSYN)  IV  3.375 g Intravenous Q8H  . sodium chloride  10-40 mL Intracatheter Q12H  . vancomycin  750 mg Intravenous Q12H   Continuous: . sodium chloride 50 mL/hr at 11/14/13 0300  . fentaNYL infusion INTRAVENOUS 100 mcg/hr (11/14/13 0835)  . phenylephrine (NEO-SYNEPHRINE) Adult infusion Stopped (11/12/13 2335)   FAO:ZHYQMVHQIONGE, acetaminophen, albuterol, fentaNYL, fentaNYL, fentaNYL, LORazepam, naLOXone (NARCAN)  injection, ondansetron, sodium chloride, sodium chloride  Assesment: He was admitted with small bowel obstruction. He vomited and aspirated developing aspiration pneumonia and septic shock. He remains intubated and on ventilator. Weaning was attempted as noted he still has secretions as well.  He is anemic probably from his acute  He has severe protein calorie malnutrition  He has been encephalopathic and that may be some of the source of his Principal Problem:   Septic shock Active Problems:   SBO (small bowel obstruction)   HTN (hypertension), benign   Acute respiratory failure with hypoxia   Acute encephalopathy    Aspiration pneumonia   Small bowel obstruction   Protein-calorie malnutrition, severe    Plan: Continue treatments. Continue efforts at weaning.    LOS: 6 days   Alonza Bogus 11/14/2013, 8:48 AM

## 2013-11-14 NOTE — Progress Notes (Signed)
Weaned Pt per Protocol. The Pt wasn't on any sedation. The Pt's HR increased (115 to 133), BP 132/75 to 170/105, RR stayed in the upper 20's 22-29. Pt became very agitated. RT will wean tomorrow but will ask nurse to give ativan right before RT starts to wean.

## 2013-11-14 NOTE — Progress Notes (Signed)
PARENTERAL NUTRITION CONSULT NOTE - INITIAL  Pharmacy Consult for TPN Indication: SBO, malnutrition, hypoalbuminemia  Allergies  Allergen Reactions  . Solu-Medrol [Methylprednisolone Acetate] Other (See Comments)    Severe encephalopathy/delirium.   Patient Measurements: Height: 5\' 1"  (154.9 cm) Weight: 154 lb 5.2 oz (70 kg) IBW/kg (Calculated) : 52.3  Vital Signs: Temp: 98.1 F (36.7 C) (05/25 0741) Temp src: Axillary (05/25 0741) BP: 132/68 mmHg (05/25 0909) Pulse Rate: 92 (05/25 0900) Intake/Output from previous day: 05/24 0701 - 05/25 0700 In: 2285 [I.V.:1235; IV Piggyback:1050] Out: 2100 [Urine:1850; Emesis/NG output:250] Intake/Output from this shift: Total I/O In: 175 [I.V.:125; IV Piggyback:50] Out: -   Labs:  Recent Labs  11/12/13 0513 11/13/13 0518 11/13/13 1450 11/14/13 0459  WBC 8.1 6.3  --  5.2  HGB 9.1* 8.4* 9.0* 8.6*  HCT 28.5* 25.9* 27.8* 26.8*  PLT 97* 102*  --  112*     Recent Labs  11/12/13 0513 11/13/13 0518 11/14/13 0459  NA 146 145 144  K 3.6* 3.5* 3.7  CL 114* 113* 109  CO2 22 23 22   GLUCOSE 79 86 95  BUN 16 14 11   CREATININE 1.28 1.13 1.00  CALCIUM 7.8* 7.9* 7.9*  MG  --   --  1.7  PROT  --  4.8*  --   ALBUMIN  --  1.9*  --   AST  --  13  --   ALT  --  8  --   ALKPHOS  --  44  --   BILITOT  --  0.4  --    Estimated Creatinine Clearance: 50.3 ml/min (by C-G formula based on Cr of 1).   No results found for this basename: GLUCAP,  in the last 72 hours  Medical History: Past Medical History  Diagnosis Date  . Hypertension   . Hypercholesterolemia   . Cancer     prostate    Medications:  Scheduled:  . antiseptic oral rinse  15 mL Mouth Rinse QID  . chlorhexidine  15 mL Mouth Rinse BID  . enoxaparin (LOVENOX) injection  40 mg Subcutaneous Q24H  . famotidine (PEPCID) IV  20 mg Intravenous Q12H  . furosemide  20 mg Intravenous Once  . ipratropium-albuterol  3 mL Nebulization Q4H  . piperacillin-tazobactam (ZOSYN)   IV  3.375 g Intravenous Q8H  . sodium chloride  10-40 mL Intracatheter Q12H  . vancomycin  750 mg Intravenous Q12H   Insulin Requirements in the past 24 hours:  None  Current Nutrition:  None, initiating TPN today  Assessment: 80yo male on ventilator with septic shock most likely related to aspiration pneumonia possibly secondary to SBO.  Hypoalbuminemia noted.  Lytes and renal fxn OK.  Nutritional Goals:  1900-2100 kCal, 115-125 grams of protein per day Goal rate for TPN = 152ml/hr if tolerated  Plan:  Initiate Clinimix E 5/15 today at 26ml/hr, advance rate as tolerated Add MVI, Trace Elements to TPN daily Lipids daily with TPN Monitor lytes, renal fxn, glucose tolerance, and fluid status F/U prealbumin  Sireen Halk A Dagoberto Nealy 11/14/2013,10:13 AM

## 2013-11-14 NOTE — Progress Notes (Addendum)
NUTRITION FOLLOW UP  Intervention:   TPN per pharmacy  Nutrition Dx:   Inadequate oral intake related to inability to eat as evidenced by NPO, mechanical ventilation; ongoing  Goal:   Pt will meet >90% of estimated nutritional needs; goal not met  Monitor:   Respiratory status, initiation/tolerance of nutrition support measures, weight changes, labs, skin assessments, I/O's  Assessment:   Patient remains intubated on ventilator support MV: 11.8 L/min Temp (24hrs), Avg:98.6 F (37 C), Min:98.1 F (36.7 C), Max:98.9 F (37.2 C)  Propofol: n/a  Pt has been on vent since 11/09/13. Pt has been undergoing weaning trials, however, has been difficult to wean, due to need for pressors, hx of agitation when being extubated, and significant secretions. Pt is currently off pressors and RT will attempt to wean tomorrow.  Noted significant wt gain of 33# (27%) x 5 days, likely due to fluid retention. Calorie need adjusted.  Pt has been started on TPN- Clinimix E 5/15 is currently running at 50 ml/hr/hr (providing 852 kcals and 60 grams protein). Pt also receiving daily lipids (250 ml 20% lipid emulsion), MVI, and trace elements. Current nutrition support regimen provides 1352 kcals and 60 grams protein, which meets 85% of estimated calorie needs and 68% of estimated protein needs. Per pharmacy goal rate of TPN is 100 ml/hr. Complete regimen at goal rate will provide 2204 kcals and 120 grams protein dailt, which will meet 139% of estimated calorie needs and 114% of estimated protein needs.   Height: Ht Readings from Last 1 Encounters:  11/09/13 _0  (1.549 m)    Weight Status:   Wt Readings from Last 1 Encounters:  11/14/13 154 lb 5.2 oz (70 kg)    Re-estimated needs:  Kcal: 1586.6 daily Protein: 88-105 grams daily Fluid: 1.5-1.7 L daily  Skin: Intact  Diet Order: NPO   Intake/Output Summary (Last 24 hours) at 11/14/13 1236 Last data filed at 11/14/13 1149  Gross per 24 hour   Intake 2050.83 ml  Output   2925 ml  Net -874.17 ml    Last BM: 11/12/13   Labs:   Recent Labs Lab 11/12/13 0513 11/13/13 0518 11/14/13 0459  NA 146 145 144  K 3.6* 3.5* 3.7  CL 114* 113* 109  CO2 _1 BUN _2 CREATININE 1.28 1.13 1.00  CALCIUM 7.8* 7.9* 7.9*  MG  --   --  1.7  GLUCOSE 79 86 95    CBG (last 3)  No results found for this basename: GLUCAP,  in the last 72 hours  Scheduled Meds: . antiseptic oral rinse  15 mL Mouth Rinse QID  . chlorhexidine  15 mL Mouth Rinse BID  . enoxaparin (LOVENOX) injection  40 mg Subcutaneous Q24H  . famotidine (PEPCID) IV  20 mg Intravenous Q12H  . ipratropium-albuterol  3 mL Nebulization Q4H  . piperacillin-tazobactam (ZOSYN)  IV  3.375 g Intravenous Q8H  . sodium chloride  10-40 mL Intracatheter Q12H  . vancomycin  750 mg Intravenous Q12H    Continuous Infusions: . Marland KitchenTPN (CLINIMIX-E) Adult     And  . fat emulsion    . fentaNYL infusion INTRAVENOUS 125 mcg/hr (11/14/13 1158)  . phenylephrine (NEO-SYNEPHRINE) Adult infusion Stopped (11/12/13 2335)    Kimbery Harwood A. Jimmye Norman, RD, LDN Pager: [phone]

## 2013-11-14 NOTE — Progress Notes (Signed)
[facility] PROGRESS NOTE  Douglas Mckay KZS:[id number] DOB: 07/03/1931 DOA: 11/20/2013 PCP: Marjo Bicker, MD  Assessment/Plan: 1. Septic shock. Likely related to aspiration pneumonia. Patient was aggressively hydrated with IV fluids and required vasopressor support.  He has now been weaned off of phenylephrine. He is on broad-spectrum antibiotics. Blood cultures have shown no growth to date.  He has been afebrile. Continue current treatments. 2. Aspiration pneumonia. Likely related to bowel obstruction. He is on vancomycin and zosyn. Continue current treatments.  Chest xray shows bilateral infiltrates.  bnp trending down. EF in normal range on echo. 3. Acute respiratory failure, likely due to pneumonia. Patient required intubation on 5/20 when he failed bipap treatment. Pulmonology following. Will try and wean from vent as tolerated. Major barrier at this point is thick/copius secretions and severe agitation during weaning process. 4. Small bowel obstruction with right inguinal hernia. Surgery is following.  Continuing NG tube decompression. Will start the patient on TPN nutrition. 5. Acute encephalopathy. Likely related to acute illness.  Will re evaluate once patient is able to extubate. 6. Hypokalemia, replace. 7. Anemia.  No evidence of melena/hematochezia/hematemesis. Likely related to critical illness.  Will continue to follow hemoglobin.   Code Status: full code Family Communication: discussed with wife at the bedside Disposition Plan: home vs. Snf, pending hospital course   Consultants:  Gen Surgery  Pulmonology  Procedures:  Intubation 5/20>>  Central line placement 5/20 Echo: - Left ventricle: The cavity size was normal. Wall thickness was normal. Systolic function was vigorous. The estimated ejection fraction was in the range of 65% to 70%. Wall motion was normal; there were no regional wall motion abnormalities. - Right ventricle: The cavity size was mildly  dilated. - Impressions: Technically limited study due to poor sound wave transmission.    Antibiotics:  Vancomycin 5/20>>  Zosyn 5/20>>  HPI/Subjective: Sedated and intubated  Objective: Filed Vitals:   11/14/13 0909  BP: 132/68  Pulse:   Temp:   Resp: 16    Intake/Output Summary (Last 24 hours) at 11/14/13 0929 Last data filed at 11/14/13 0908  Gross per 24 hour  Intake   2340 ml  Output   2100 ml  Net    240 ml   Filed Weights   11/12/13 0430 11/13/13 0432 11/14/13 0330  Weight: 63.2 kg (139 lb 5.3 oz) 69.1 kg (152 lb 5.4 oz) 70 kg (154 lb 5.2 oz)    Exam:   General:  Sedated, intubated on vent  Cardiovascular: S1, S2 tachycardic  Respiratory: rhonchi bilaterally  Abdomen: soft, nt, nd, bs+  Musculoskeletal:  Trace pedal edema b/l   Data Reviewed: Basic Metabolic Panel:  Recent Labs Lab 11/10/13 0443 Dec 05, 2013 0401 11/12/13 0513 11/13/13 0518 11/14/13 0459  NA 147 147 146 145 144  K 3.6* 3.9 3.6* 3.5* 3.7  CL 112 115* 114* 113* 109  CO2 21 21 22 23 22   GLUCOSE 100* 79 79 86 95  BUN 22 19 16 14 11   CREATININE 1.20 1.26 1.28 1.13 1.00  CALCIUM 7.1* 7.3* 7.8* 7.9* 7.9*  MG  --   --   --   --  1.7   Liver Function Tests:  Recent Labs Lab 11/19/2013 2222 11/13/13 0518  AST 22 13  ALT 16 8  ALKPHOS 65 44  BILITOT 0.4 0.4  PROT 7.5 4.8*  ALBUMIN 4.3 1.9*    Recent Labs Lab 10/21/2013 2222  LIPASE 22   No results found for this basename: AMMONIA,  in the last  168 hours CBC:  Recent Labs Lab 11/17/2013 2222  11/10/13 0443 12/08/13 0401 11/12/13 0513 11/13/13 0518 11/13/13 1450 11/14/13 0459  WBC 3.5*  < > 3.9* 8.0 8.1 6.3  --  5.2  NEUTROABS 3.1  --   --   --   --   --   --   --   HGB 12.4*  < > 10.0* 9.3* 9.1* 8.4* 9.0* 8.6*  HCT 39.4  < > 31.6* 28.6* 28.5* 25.9* 27.8* 26.8*  MCV 84.5  < > 85.9 82.9 84.1 82.5  --  83.2  PLT 152  < > 108* 110* 97* 102*  --  112*  < > = values in this interval not displayed. Cardiac  Enzymes: No results found for this basename: CKTOTAL, CKMB, CKMBINDEX, TROPONINI,  in the last 168 hours BNP (last 3 results)  Recent Labs  11/09/13 2247 11/12/13 0500  PROBNP 1241.0* 529.2*   CBG: No results found for this basename: GLUCAP,  in the last 168 hours  Recent Results (from the past 240 hour(s))  MRSA PCR SCREENING     Status: None   Collection Time    11/09/13  6:15 PM      Result Value Ref Range Status   MRSA by PCR NEGATIVE  NEGATIVE Final   Comment:            The GeneXpert MRSA Assay (FDA     approved for NASAL specimens     only), is one component of a     comprehensive MRSA colonization     surveillance program. It is not     intended to diagnose MRSA     infection nor to guide or     monitor treatment for     MRSA infections.  CULTURE, BLOOD (ROUTINE X 2)     Status: None   Collection Time    11/09/13 10:47 PM      Result Value Ref Range Status   Specimen Description BLOOD LEFT WRIST   Final   Special Requests     Final   Value: BOTTLES DRAWN AEROBIC AND ANAEROBIC AEB=6CC ANA=4CC   Culture NO GROWTH 4 DAYS   Final   Report Status PENDING   Incomplete  CULTURE, BLOOD (ROUTINE X 2)     Status: None   Collection Time    11/09/13 10:47 PM      Result Value Ref Range Status   Specimen Description BLOOD CENTRAL LINE DRAWN BY RN   Final   Special Requests BOTTLES DRAWN AEROBIC AND ANAEROBIC 8CC EACH   Final   Culture NO GROWTH 4 DAYS   Final   Report Status PENDING   Incomplete     Studies: Dg Chest 1v Repeat Same Day  11/12/2013   CLINICAL DATA:  Endotracheal tube not visible on prior study  EXAM: CHEST - 1 VIEW SAME DAY  COMPARISON:  Earlier film of the same day  FINDINGS: Endotracheal tube is now more conspicuous, tip 5.3 cm above carina. Endotracheal tube and central line are stable in position. Perihilar interstitial and alveolar edema or infiltrates slightly improved. Heart size remains normal. Atheromatous aorta. . No effusion. Visualized skeletal  structures are unremarkable.  IMPRESSION: 1. Endotracheal tube tip 5.3 cm above carina. 2. Continued  improvement in perihilar edema/infiltrates.   Electronically Signed   By: Arne Cleveland M.D.   On: 11/12/2013 14:05   Dg Chest Port 1 View  11/14/2013   CLINICAL DATA:  Respiratory failure.  EXAM: PORTABLE  CHEST - 1 VIEW  COMPARISON:  Nov 13, 2013.  FINDINGS: Stable cardiomediastinal silhouette. Endotracheal and nasogastric tubes are unchanged in position. Right subclavian catheter line is unchanged in position. Stable bilateral perihilar opacities are noted concerning for edema or pneumonia. Slightly increased left basilar opacity is noted concerning for edema or atelectasis.  IMPRESSION: Stable support apparatus. Stable bilateral perihilar opacities concerning for edema or pneumonia. Slightly increased left basilar opacity is noted concerning for edema or atelectasis.   Electronically Signed   By: Sabino Dick M.D.   On: 11/14/2013 09:18   Dg Chest Port 1 View  11/13/2013   CLINICAL DATA:  Respiratory failure.  EXAM: PORTABLE CHEST - 1 VIEW  COMPARISON:  Nov 12, 2013.  FINDINGS: Stable cardiomediastinal silhouette. Endotracheal tube is in grossly good position with distal tip 5 cm above the Carina. Nasogastric tube is seen passing through the esophagus into the stomach. Right subclavian catheter line is unchanged in position with tip in expected position of the SVC. No pneumothorax or significant pleural effusion is noted. Stable bilateral perihilar opacities are noted concerning for edema or possibly pneumonia. Bony thorax is intact.  IMPRESSION: Stable support apparatus. Stable bilateral perihilar opacities are noted concerning for edema or pneumonia.   Electronically Signed   By: Sabino Dick M.D.   On: 11/13/2013 09:29    Scheduled Meds: . antiseptic oral rinse  15 mL Mouth Rinse QID  . chlorhexidine  15 mL Mouth Rinse BID  . enoxaparin (LOVENOX) injection  40 mg Subcutaneous Q24H  . famotidine  (PEPCID) IV  20 mg Intravenous Q12H  . ipratropium-albuterol  3 mL Nebulization Q4H  . piperacillin-tazobactam (ZOSYN)  IV  3.375 g Intravenous Q8H  . sodium chloride  10-40 mL Intracatheter Q12H  . vancomycin  750 mg Intravenous Q12H   Continuous Infusions: . sodium chloride 50 mL/hr at 11/14/13 0900  . fentaNYL infusion INTRAVENOUS 125 mcg/hr (11/14/13 0859)  . phenylephrine (NEO-SYNEPHRINE) Adult infusion Stopped (11/12/13 2335)    Principal Problem:   Septic shock Active Problems:   SBO (small bowel obstruction)   HTN (hypertension), benign   Acute respiratory failure with hypoxia   Acute encephalopathy   Aspiration pneumonia   Small bowel obstruction   Protein-calorie malnutrition, severe    Time spent: critical care: 68mins    Malayshia All  [facility] Pager [phone]. If 7PM-7AM, please contact night-coverage at [email or web address], password [facility] 11/14/2013, 9:29 AM  LOS: 6 days

## 2013-11-15 ENCOUNTER — Inpatient Hospital Stay (HOSPITAL_COMMUNITY): Payer: Medicare Other

## 2013-11-15 DIAGNOSIS — E43 Unspecified severe protein-calorie malnutrition: Secondary | ICD-10-CM

## 2013-11-15 LAB — GLUCOSE, CAPILLARY
Glucose-Capillary: 146 mg/dL — ABNORMAL HIGH (ref 70–99)
Glucose-Capillary: 124 mg/dL — ABNORMAL HIGH (ref 70–99)
Glucose-Capillary: 133 mg/dL — ABNORMAL HIGH (ref 70–99)
Glucose-Capillary: 96 mg/dL (ref 70–99)

## 2013-11-15 LAB — EXPECTORATED SPUTUM ASSESSMENT W REFEX TO RESP CULTURE

## 2013-11-15 LAB — CBC
HCT: 25.7 % — ABNORMAL LOW (ref 39.0–52.0)
Hemoglobin: 8.3 g/dL — ABNORMAL LOW (ref 13.0–17.0)
MCH: 26.3 pg (ref 26.0–34.0)
MCHC: 32.3 g/dL (ref 30.0–36.0)
MCV: 81.6 fL (ref 78.0–100.0)
Platelets: 130 10*3/uL — ABNORMAL LOW (ref 150–400)
RBC: 3.15 MIL/uL — ABNORMAL LOW (ref 4.22–5.81)
RDW: 14.5 % (ref 11.5–15.5)
WBC: 5.1 10*3/uL (ref 4.0–10.5)

## 2013-11-15 LAB — COMPREHENSIVE METABOLIC PANEL
ALT: 10 U/L (ref 0–53)
AST: 16 U/L (ref 0–37)
Albumin: 1.9 g/dL — ABNORMAL LOW (ref 3.5–5.2)
Alkaline Phosphatase: 50 U/L (ref 39–117)
BUN: 10 mg/dL (ref 6–23)
CALCIUM: 8 mg/dL — AB (ref 8.4–10.5)
CO2: 29 mEq/L (ref 19–32)
Chloride: 107 mEq/L (ref 96–112)
Creatinine, Ser: 1.02 mg/dL (ref 0.50–1.35)
GFR calc non Af Amer: 68 mL/min — ABNORMAL LOW (ref >90)
GFR, EST AFRICAN AMERICAN: 79 mL/min — AB (ref >90)
GLUCOSE: 195 mg/dL — AB (ref 70–99)
Potassium: 3 mEq/L — ABNORMAL LOW (ref 3.7–5.3)
SODIUM: 145 meq/L (ref 137–147)
Total Bilirubin: 0.3 mg/dL (ref 0.3–1.2)
Total Protein: 4.9 g/dL — ABNORMAL LOW (ref 6.0–8.3)

## 2013-11-15 LAB — DIFFERENTIAL
BASOS PCT: 0 % (ref 0–1)
Basophils Absolute: 0 10*3/uL (ref 0.0–0.1)
EOS PCT: 2 % (ref 0–5)
Eosinophils Absolute: 0.1 10*3/uL (ref 0.0–0.7)
Lymphocytes Relative: 9 % — ABNORMAL LOW (ref 12–46)
Lymphs Abs: 0.4 10*3/uL — ABNORMAL LOW (ref 0.7–4.0)
Monocytes Absolute: 0.6 10*3/uL (ref 0.1–1.0)
Monocytes Relative: 11 % (ref 3–12)
Neutro Abs: 4 10*3/uL (ref 1.7–7.7)
Neutrophils Relative %: 78 % — ABNORMAL HIGH (ref 43–77)

## 2013-11-15 LAB — EXPECTORATED SPUTUM ASSESSMENT W GRAM STAIN, RFLX TO RESP C

## 2013-11-15 LAB — TRIGLYCERIDES: Triglycerides: 95 mg/dL (ref <150)

## 2013-11-15 LAB — PHOSPHORUS: Phosphorus: 2.4 mg/dL (ref 2.3–4.6)

## 2013-11-15 LAB — PREALBUMIN: Prealbumin: 5 mg/dL — ABNORMAL LOW (ref 17.0–34.0)

## 2013-11-15 LAB — MAGNESIUM: Magnesium: 1.6 mg/dL (ref 1.5–2.5)

## 2013-11-15 MED ORDER — INSULIN ASPART 100 UNIT/ML ~~LOC~~ SOLN
0.0000 [IU] | SUBCUTANEOUS | Status: DC
  Administered 2013-11-15 – 2013-11-16 (×6): 2 [IU] via SUBCUTANEOUS
  Administered 2013-11-17: 3 [IU] via SUBCUTANEOUS
  Administered 2013-11-17: 5 [IU] via SUBCUTANEOUS
  Administered 2013-11-18: 3 [IU] via SUBCUTANEOUS
  Administered 2013-11-18: 2 [IU] via SUBCUTANEOUS

## 2013-11-15 MED ORDER — POTASSIUM CHLORIDE 10 MEQ/100ML IV SOLN
10.0000 meq | INTRAVENOUS | Status: AC
  Administered 2013-11-15 (×6): 10 meq via INTRAVENOUS
  Filled 2013-11-15: qty 100

## 2013-11-15 MED ORDER — FAT EMULSION 20 % IV EMUL
250.0000 mL | INTRAVENOUS | Status: AC
  Administered 2013-11-15: 250 mL via INTRAVENOUS
  Filled 2013-11-15 (×3): qty 250

## 2013-11-15 MED ORDER — TRACE MINERALS CR-CU-F-FE-I-MN-MO-SE-ZN IV SOLN
INTRAVENOUS | Status: AC
  Administered 2013-11-15: 17:00:00 via INTRAVENOUS
  Filled 2013-11-15: qty 2000

## 2013-11-15 NOTE — Progress Notes (Signed)
First dose of ct liquid contrast via via og tube.

## 2013-11-15 NOTE — Progress Notes (Signed)
[place] NOTE  Pharmacy Consult for TPN Indication: SBO, malnutrition, hypoalbuminemia  Allergies  Allergen Reactions  . Solu-Medrol [Methylprednisolone Acetate] Other (See Comments)    Severe encephalopathy/delirium.   Patient Measurements: Height: 5\' 1"  (154.9 cm) Weight: 132 lb 0.9 oz (59.9 kg) IBW/kg (Calculated) : 52.3  Vital Signs: Temp: 99.5 F (37.5 C) (05/26 0400) Temp src: Axillary (05/26 0400) BP: 113/64 mmHg (05/26 0755) Pulse Rate: 105 (05/26 0755) Intake/Output from previous day: 05/25 0701 - 05/26 0700 In: 1695.2 [I.V.:365.8; IV Piggyback:550; TPN:779.3] Out: 5650 [Urine:5650] Intake/Output from this shift:    Labs:  Recent Labs  11/13/13 0518 11/13/13 1450 11/14/13 0459 11/15/13 0402  WBC 6.3  --  5.2 5.1  HGB 8.4* 9.0* 8.6* 8.3*  HCT 25.9* 27.8* 26.8* 25.7*  PLT 102*  --  112* 130*     Recent Labs  11/13/13 0518 11/14/13 0459 11/15/13 0402  NA 145 144 145  K 3.5* 3.7 3.0*  CL 113* 109 107  CO2 23 22 29   GLUCOSE 86 95 195*  BUN 14 11 10   CREATININE 1.13 1.00 1.02  CALCIUM 7.9* 7.9* 8.0*  MG  --  1.7 1.6  PHOS  --   --  2.4  PROT 4.8*  --  4.9*  ALBUMIN 1.9*  --  1.9*  AST 13  --  16  ALT 8  --  10  ALKPHOS 44  --  50  BILITOT 0.4  --  0.3  TRIG  --   --  95   Estimated Creatinine Clearance: 43.4 ml/min (by C-G formula based on Cr of 1.02).   No results found for this basename: GLUCAP,  in the last 72 hours  Medical History: Past Medical History  Diagnosis Date  . Hypertension   . Hypercholesterolemia   . Cancer     prostate    Medications:  Scheduled:  . antiseptic oral rinse  15 mL Mouth Rinse QID  . chlorhexidine  15 mL Mouth Rinse BID  . enoxaparin (LOVENOX) injection  40 mg Subcutaneous Q24H  . famotidine (PEPCID) IV  20 mg Intravenous Q12H  . ipratropium-albuterol  3 mL Nebulization Q4H  . piperacillin-tazobactam (ZOSYN)  IV  3.375 g Intravenous Q8H  . sodium chloride  10-40 mL  Intracatheter Q12H  . vancomycin  750 mg Intravenous Q12H   Insulin Requirements in the past 24 hours:  None  Current Nutrition:  None, initiating TPN today  Assessment: 80yo M on ventilator with septic shock most likely related to aspiration pneumonia possibly secondary to SBO.   Severe malnutrition, hypoalbuminemia noted.   Renal function is stable.  Potassium is low today.  No hx of DM, but blood glucose is elevated on morning Bmet.  Nutritional Goals:  Kcal: 1586.6 daily  Protein: 88-105 grams daily  Fluid: 1.5-1.7 L daily  Plan:  Initiate Clinimix E 5/15 today at 40ml/hr.  Will not advance rate today given elevated blood sugar. Add MVI, Trace Elements to TPN daily Lipids daily with TPN Check CBGs q4h & treat with moderate SSI as needed.   KCl 47mEq IV x6 runs Monitor lytes, renal fxn, CBGs, and fluid status F/U prealbumin  Lavonia Drafts Steffie Waggoner 11/15/2013,8:17 AM

## 2013-11-15 NOTE — Progress Notes (Signed)
[facility] PROGRESS NOTE  Douglas Mckay ERD:[id number] DOB: [birth year] DOA: 11/19/2013 PCP: Marjo Bicker, MD  Summary:  This is a 80 year old gentleman who was admitted to the hospital with abdominal pain, distention, nausea and vomiting and found to have a small bowel obstruction with a right inguinal hernia. He was initially admitted to to a medical bed under the surgical service, NG tube was placed for decompression. Shortly after admission, patient became increasingly tachypneic, hypoxic and less responsive. He was placed on BiPAP therapy, but was not appearing to tolerate this. Ultimately, he required transfer to the ICU, with intubation for airway management and severe hypoxia. Hospitalist service assumed care as his primary attending. Chest x-ray indicated aspiration pneumonia. He was started on empiric antibiotics. He subsequently went into septic shock requiring IV fluids and vasopressors. His blood pressure has since stabilized and he has been weaned off of vasopressors. He is continued on antibiotics. Weaning from the ventilator has been difficult due to thick secretions and agitation during the weaning process. He is undergoing daily weaning trials. Surgery continues to follow for small bowel obstruction, but this will have to be further addressed once he is stabilized from a pulmonary standpoint.   Assessment/Plan: 1. Septic shock. Likely related to aspiration pneumonia. Patient was aggressively hydrated with IV fluids and initially required vasopressor support.  He was gradually able to wean off pressors and has not required pressors in 48 hours. He is on broad-spectrum antibiotics. Blood cultures have shown no growth to date.  Continue current treatments. 2. Aspiration pneumonia. Likely related to bowel obstruction. He is on vancomycin and zosyn. Continue current treatments.  Chest xray shows bilateral infiltrates.  EF in normal range on echo. He is still having low grade  temps.  Will check sputum culture. 3. Acute respiratory failure, likely due to pneumonia. Patient required intubation on 5/20 when he failed bipap treatment. Pulmonology following. Will try and wean from vent as tolerated.  4. Small bowel obstruction with right inguinal hernia. Surgery is following.  Continuing NG tube decompression.  5. Severe protein calorie malnutrition and hypoalbuminemia. Related to small bowel obstruction and prolonged NPO.  Started on TPN on 5/25 6. Acute encephalopathy. Likely related to acute illness.  Will re evaluate once patient is able to extubate. 7. Hypokalemia, replace. 8. Anemia.  No evidence of melena/hematochezia/hematemesis. Likely related to critical illness.  Will continue to follow hemoglobin.   Code Status: full code Family Communication: discussed with wife at the bedside Disposition Plan: home vs. Snf, pending hospital course   Consultants:  Gen Surgery  Pulmonology  Procedures:  Intubation 5/20>>  Central line placement 5/20  Echo: - Left ventricle: The cavity size was normal. Wall thickness was normal. Systolic function was vigorous. The estimated ejection fraction was in the range of 65% to 70%. Wall motion was normal; there were no regional wall motion abnormalities. - Right ventricle: The cavity size was mildly dilated. - Impressions: Technically limited study due to poor sound wave transmission.    Antibiotics:  Vancomycin 5/20>>  Zosyn 5/20>>  HPI/Subjective: Sedated and intubated  Objective: Filed Vitals:   11/15/13 0800  BP: 114/64  Pulse: 110  Temp: 99.2 F (37.3 C)  Resp: 16    Intake/Output Summary (Last 24 hours) at 11/15/13 0858 Last data filed at 11/15/13 0700  Gross per 24 hour  Intake 1620.16 ml  Output   5650 ml  Net -4029.84 ml   Filed Weights   11/13/13 0432 11/14/13 0330 11/15/13 0500  Weight: 69.1  kg (152 lb 5.4 oz) 70 kg (154 lb 5.2 oz) 59.9 kg (132 lb 0.9 oz)    Exam:   General:   Sedated, intubated on vent  Cardiovascular: S1, S2 tachycardic  Respiratory: rhonchi bilaterally  Abdomen: soft, nt, nd, bs+  Musculoskeletal:  Trace pedal edema b/l   Data Reviewed: Basic Metabolic Panel:  Recent Labs Lab 11/14/2013 0401 11/12/13 0513 11/13/13 0518 11/14/13 0459 11/15/13 0402  NA 147 146 145 144 145  K 3.9 3.6* 3.5* 3.7 3.0*  CL 115* 114* 113* 109 107  CO2 21 22 23 22 29   GLUCOSE 79 79 86 95 195*  BUN 19 16 14 11 10   CREATININE 1.26 1.28 1.13 1.00 1.02  CALCIUM 7.3* 7.8* 7.9* 7.9* 8.0*  MG  --   --   --  1.7 1.6  PHOS  --   --   --   --  2.4   Liver Function Tests:  Recent Labs Lab 2013-12-02 2222 11/13/13 0518 11/15/13 0402  AST 22 13 16   ALT 16 8 10   ALKPHOS 65 44 50  BILITOT 0.4 0.4 0.3  PROT 7.5 4.8* 4.9*  ALBUMIN 4.3 1.9* 1.9*    Recent Labs Lab 12/02/13 2222  LIPASE 22   No results found for this basename: AMMONIA,  in the last 168 hours CBC:  Recent Labs Lab 2013/12/02 2222  11/04/2013 0401 11/12/13 0513 11/13/13 0518 11/13/13 1450 11/14/13 0459 11/15/13 0402  WBC 3.5*  < > 8.0 8.1 6.3  --  5.2 5.1  NEUTROABS 3.1  --   --   --   --   --   --  4.0  HGB 12.4*  < > 9.3* 9.1* 8.4* 9.0* 8.6* 8.3*  HCT 39.4  < > 28.6* 28.5* 25.9* 27.8* 26.8* 25.7*  MCV 84.5  < > 82.9 84.1 82.5  --  83.2 81.6  PLT 152  < > 110* 97* 102*  --  112* 130*  < > = values in this interval not displayed. Cardiac Enzymes: No results found for this basename: CKTOTAL, CKMB, CKMBINDEX, TROPONINI,  in the last 168 hours BNP (last 3 results)  Recent Labs  11/09/13 2247 11/12/13 0500  PROBNP 1241.0* 529.2*   CBG: No results found for this basename: GLUCAP,  in the last 168 hours  Recent Results (from the past 240 hour(s))  MRSA PCR SCREENING     Status: None   Collection Time    11/09/13  6:15 PM      Result Value Ref Range Status   MRSA by PCR NEGATIVE  NEGATIVE Final   Comment:            The GeneXpert MRSA Assay (FDA     approved for NASAL  specimens     only), is one component of a     comprehensive MRSA colonization     surveillance program. It is not     intended to diagnose MRSA     infection nor to guide or     monitor treatment for     MRSA infections.  CULTURE, BLOOD (ROUTINE X 2)     Status: None   Collection Time    11/09/13 10:47 PM      Result Value Ref Range Status   Specimen Description BLOOD LEFT WRIST   Final   Special Requests     Final   Value: BOTTLES DRAWN AEROBIC AND ANAEROBIC AEB=6CC ANA=4CC   Culture NO GROWTH 5 DAYS   Final  Report Status 11/14/2013 FINAL   Final  CULTURE, BLOOD (ROUTINE X 2)     Status: None   Collection Time    11/09/13 10:47 PM      Result Value Ref Range Status   Specimen Description BLOOD CENTRAL LINE DRAWN BY RN   Final   Special Requests BOTTLES DRAWN AEROBIC AND ANAEROBIC [place]   Final   Culture NO GROWTH 5 DAYS   Final   Report Status 11/14/2013 FINAL   Final     Studies: Dg Chest Port 1 View  11/15/2013   CLINICAL DATA:  Respiratory failure  EXAM: PORTABLE CHEST - 1 VIEW  COMPARISON:  11/14/2013  FINDINGS: Cardiac shadow is stable. A right-sided central venous line, nasogastric catheter and endotracheal tube are identified. The endotracheal tube is approximately 5.7 cm above the carina. Nasogastric catheter is withdrawn and now lies within the distal stomach. This could be advanced somewhat. Bilateral patchy infiltrates are again identified with slight increase on the left. No bony abnormality is noted. Nipple shadows are noted bilaterally.  IMPRESSION: Slight increase in left perihilar infiltrate. The remainder of the infiltrative densities are stable.  Tubes and lines as described. The nasogastric catheter should be advanced into the stomach.   Electronically Signed   By: Inez Catalina M.D.   On: 11/15/2013 08:07   Dg Chest Port 1 View  11/14/2013   CLINICAL DATA:  Respiratory failure.  EXAM: PORTABLE CHEST - 1 VIEW  COMPARISON:  Nov 13, 2013.  FINDINGS: Stable  cardiomediastinal silhouette. Endotracheal and nasogastric tubes are unchanged in position. Right subclavian catheter line is unchanged in position. Stable bilateral perihilar opacities are noted concerning for edema or pneumonia. Slightly increased left basilar opacity is noted concerning for edema or atelectasis.  IMPRESSION: Stable support apparatus. Stable bilateral perihilar opacities concerning for edema or pneumonia. Slightly increased left basilar opacity is noted concerning for edema or atelectasis.   Electronically Signed   By: Sabino Dick M.D.   On: 11/14/2013 09:18    Scheduled Meds: . antiseptic oral rinse  15 mL Mouth Rinse QID  . chlorhexidine  15 mL Mouth Rinse BID  . enoxaparin (LOVENOX) injection  40 mg Subcutaneous Q24H  . famotidine (PEPCID) IV  20 mg Intravenous Q12H  . insulin aspart  0-15 Units Subcutaneous 6 times per day  . ipratropium-albuterol  3 mL Nebulization Q4H  . piperacillin-tazobactam (ZOSYN)  IV  3.375 g Intravenous Q8H  . potassium chloride  10 mEq Intravenous Q1 Hr x 6  . sodium chloride  10-40 mL Intracatheter Q12H  . vancomycin  750 mg Intravenous Q12H   Continuous Infusions: . Marland KitchenTPN (CLINIMIX-E) Adult 50 mL/hr at 11/15/13 0700   And  . fat emulsion 500 kcal (11/15/13 0700)  . Marland KitchenTPN (CLINIMIX-E) Adult     And  . fat emulsion    . fentaNYL infusion INTRAVENOUS 150 mcg/hr (11/15/13 0700)  . phenylephrine (NEO-SYNEPHRINE) Adult infusion Stopped (11/12/13 2335)    Principal Problem:   Septic shock Active Problems:   SBO (small bowel obstruction)   HTN (hypertension), benign   Acute respiratory failure with hypoxia   Acute encephalopathy   Aspiration pneumonia   Small bowel obstruction   Protein-calorie malnutrition, severe    Time spent: critical care: 74mins    Shatonia Hoots  [facility] Pager [phone]. If 7PM-7AM, please contact night-coverage at [email or web address], password [facility] 11/15/2013, 8:58 AM  LOS: 7 days

## 2013-11-15 NOTE — Progress Notes (Signed)
Subjective: He remains off pressors. He has been started on TPN.  Objective: Vital signs in last 24 hours: Temp:  [98.6 F (37 C)-100.9 F (38.3 C)] 99.2 F (37.3 C) (05/26 0800) Pulse Rate:  [25-126] 110 (05/26 0800) Resp:  [14-40] 16 (05/26 0800) BP: (107-212)/(58-140) 114/64 mmHg (05/26 0800) SpO2:  [91 %-100 %] 100 % (05/26 0600) FiO2 (%):  [40 %] 40 % (05/26 0755) Weight:  [59.9 kg (132 lb 0.9 oz)] 59.9 kg (132 lb 0.9 oz) (05/26 0500) Weight change: -10.1 kg (-22 lb 4.3 oz) Last BM Date: 11/12/13  Intake/Output from previous day: 05/25 0701 - 05/26 0700 In: 1695.2 [I.V.:365.8; IV Piggyback:550; TPN:779.3] Out: 5650 [Urine:5650]  PHYSICAL EXAM General appearance: Intubated sedated and on mechanical ventilation Resp: rhonchi bilaterally Cardio: regular rate and rhythm, S1, S2 normal, no murmur, click, rub or gallop GI: soft, non-tender; bowel sounds normal; no masses,  no organomegaly Extremities: extremities normal, atraumatic, no cyanosis or edema  Lab Results:    Basic Metabolic Panel:  Recent Labs  11/14/13 0459 11/15/13 0402  NA 144 145  K 3.7 3.0*  CL 109 107  CO2 22 29  GLUCOSE 95 195*  BUN 11 10  CREATININE 1.00 1.02  CALCIUM 7.9* 8.0*  MG 1.7 1.6  PHOS  --  2.4   Liver Function Tests:  Recent Labs  11/13/13 0518 11/15/13 0402  AST 13 16  ALT 8 10  ALKPHOS 44 50  BILITOT 0.4 0.3  PROT 4.8* 4.9*  ALBUMIN 1.9* 1.9*   No results found for this basename: LIPASE, AMYLASE,  in the last 72 hours No results found for this basename: AMMONIA,  in the last 72 hours CBC:  Recent Labs  11/14/13 0459 11/15/13 0402  WBC 5.2 5.1  NEUTROABS  --  4.0  HGB 8.6* 8.3*  HCT 26.8* 25.7*  MCV 83.2 81.6  PLT 112* 130*   Cardiac Enzymes: No results found for this basename: CKTOTAL, CKMB, CKMBINDEX, TROPONINI,  in the last 72 hours BNP: No results found for this basename: PROBNP,  in the last 72 hours D-Dimer: No results found for this basename:  DDIMER,  in the last 72 hours CBG: No results found for this basename: GLUCAP,  in the last 72 hours Hemoglobin A1C: No results found for this basename: HGBA1C,  in the last 72 hours Fasting Lipid Panel:  Recent Labs  11/15/13 0402  TRIG 95   Thyroid Function Tests: No results found for this basename: TSH, T4TOTAL, FREET4, T3FREE, THYROIDAB,  in the last 72 hours Anemia Panel: No results found for this basename: VITAMINB12, FOLATE, FERRITIN, TIBC, IRON, RETICCTPCT,  in the last 72 hours Coagulation: No results found for this basename: LABPROT, INR,  in the last 72 hours Urine Drug Screen: Drugs of Abuse  No results found for this basename: labopia, cocainscrnur, labbenz, amphetmu, thcu, labbarb    Alcohol Level: No results found for this basename: ETH,  in the last 72 hours Urinalysis: No results found for this basename: COLORURINE, APPERANCEUR, LABSPEC, PHURINE, GLUCOSEU, HGBUR, BILIRUBINUR, KETONESUR, PROTEINUR, UROBILINOGEN, NITRITE, LEUKOCYTESUR,  in the last 72 hours Misc. Labs:  ABGS  Recent Labs  11/14/13 0500  PHART 7.351  PO2ART 81.7  TCO2 19.0  HCO3 20.6   CULTURES Recent Results (from the past 240 hour(s))  MRSA PCR SCREENING     Status: None   Collection Time    11/09/13  6:15 PM      Result Value Ref Range Status   MRSA  by PCR NEGATIVE  NEGATIVE Final   Comment:            The GeneXpert MRSA Assay (FDA     approved for NASAL specimens     only), is one component of a     comprehensive MRSA colonization     surveillance program. It is not     intended to diagnose MRSA     infection nor to guide or     monitor treatment for     MRSA infections.  CULTURE, BLOOD (ROUTINE X 2)     Status: None   Collection Time    11/09/13 10:47 PM      Result Value Ref Range Status   Specimen Description BLOOD LEFT WRIST   Final   Special Requests     Final   Value: BOTTLES DRAWN AEROBIC AND ANAEROBIC AEB=6CC ANA=4CC   Culture NO GROWTH 5 DAYS   Final   Report  Status 11/14/2013 FINAL   Final  CULTURE, BLOOD (ROUTINE X 2)     Status: None   Collection Time    11/09/13 10:47 PM      Result Value Ref Range Status   Specimen Description BLOOD CENTRAL LINE DRAWN BY RN   Final   Special Requests BOTTLES DRAWN AEROBIC AND ANAEROBIC [place]   Final   Culture NO GROWTH 5 DAYS   Final   Report Status 11/14/2013 FINAL   Final   Studies/Results: Dg Chest Port 1 View  11/15/2013   CLINICAL DATA:  Respiratory failure  EXAM: PORTABLE CHEST - 1 VIEW  COMPARISON:  11/14/2013  FINDINGS: Cardiac shadow is stable. A right-sided central venous line, nasogastric catheter and endotracheal tube are identified. The endotracheal tube is approximately 5.7 cm above the carina. Nasogastric catheter is withdrawn and now lies within the distal stomach. This could be advanced somewhat. Bilateral patchy infiltrates are again identified with slight increase on the left. No bony abnormality is noted. Nipple shadows are noted bilaterally.  IMPRESSION: Slight increase in left perihilar infiltrate. The remainder of the infiltrative densities are stable.  Tubes and lines as described. The nasogastric catheter should be advanced into the stomach.   Electronically Signed   By: Inez Catalina M.D.   On: 11/15/2013 08:07   Dg Chest Port 1 View  11/14/2013   CLINICAL DATA:  Respiratory failure.  EXAM: PORTABLE CHEST - 1 VIEW  COMPARISON:  Nov 13, 2013.  FINDINGS: Stable cardiomediastinal silhouette. Endotracheal and nasogastric tubes are unchanged in position. Right subclavian catheter line is unchanged in position. Stable bilateral perihilar opacities are noted concerning for edema or pneumonia. Slightly increased left basilar opacity is noted concerning for edema or atelectasis.  IMPRESSION: Stable support apparatus. Stable bilateral perihilar opacities concerning for edema or pneumonia. Slightly increased left basilar opacity is noted concerning for edema or atelectasis.   Electronically Signed    By: Sabino Dick M.D.   On: 11/14/2013 09:18    Medications:  Prior to Admission:  Prescriptions prior to admission  Medication Sig Dispense Refill  . acetaminophen (TYLENOL) 325 MG tablet Take 2 tablets (650 mg total) by mouth every 6 (six) hours as needed for mild pain (or Fever >/= 101).      Marland Kitchen amLODipine-benazepril (LOTREL) 5-40 MG per capsule Take 1 capsule by mouth daily.      . carvedilol (COREG) 3.125 MG tablet Take 3.125 mg by mouth daily.      . simvastatin (ZOCOR) 20 MG tablet Take 20 mg  by mouth every evening.       Scheduled: . antiseptic oral rinse  15 mL Mouth Rinse QID  . chlorhexidine  15 mL Mouth Rinse BID  . enoxaparin (LOVENOX) injection  40 mg Subcutaneous Q24H  . famotidine (PEPCID) IV  20 mg Intravenous Q12H  . ipratropium-albuterol  3 mL Nebulization Q4H  . piperacillin-tazobactam (ZOSYN)  IV  3.375 g Intravenous Q8H  . sodium chloride  10-40 mL Intracatheter Q12H  . vancomycin  750 mg Intravenous Q12H   Continuous: . Marland KitchenTPN (CLINIMIX-E) Adult 50 mL/hr at 11/15/13 0700   And  . fat emulsion 500 kcal (11/15/13 0700)  . fentaNYL infusion INTRAVENOUS 150 mcg/hr (11/15/13 0700)  . phenylephrine (NEO-SYNEPHRINE) Adult infusion Stopped (11/12/13 2335)   HTX:[id number], acetaminophen, albuterol, fentaNYL, fentaNYL, fentaNYL, LORazepam, naLOXone (NARCAN)  injection, ondansetron, sodium chloride, sodium chloride  Assesment: He was admitted with small bowel obstruction. After admission he aspirated developed aspiration pneumonia and septic shock. He has protein calorie malnutrition and is now on parenteral nutrition. He had been on pressors and is now off in his secretions are more manageable Principal Problem:   Septic shock Active Problems:   SBO (small bowel obstruction)   HTN (hypertension), benign   Acute respiratory failure with hypoxia   Acute encephalopathy   Aspiration pneumonia   Small bowel obstruction   Protein-calorie malnutrition,  severe    Plan: Attempt weaning today    LOS: 7 days   Alonza Bogus 11/15/2013, 8:25 AM

## 2013-11-15 NOTE — Progress Notes (Signed)
RT concerned about the PT's neurological state. He is still not responding. Per wife said before 11/10/13 Pt was able to walk, talk and respond appropriately. RT has'nt seen any appropriate responses from the PT. RT started wean at 0750 and the PT didn't wake up until about 1240. The Pt's BP 189/101, HR 135 and RR 46 increased within 5 minutes of the PT waking up. The nurse gave him ativan but he was still very agitated and RT placed him back on full support. Pt may need a CT and EEG to check  On his neurological state. We need to talk to the wife and see if she wants him re-intubated if we extubate and he doesn't respond well to being extubated.

## 2013-11-15 NOTE — Progress Notes (Signed)
360 cc ct liquid contrast gien via og tube

## 2013-11-15 NOTE — Progress Notes (Signed)
Started weaning Pt at 66. The Pt isnt alert today. He was very agitated yesterday during wean and today RT is unable to get any response from the Pt. RT is worried about his mental capacity. RT will continue to wean. The Pt responds to suctioning with a grimace and a cough.

## 2013-11-16 ENCOUNTER — Inpatient Hospital Stay (HOSPITAL_COMMUNITY): Payer: Medicare Other

## 2013-11-16 DIAGNOSIS — D696 Thrombocytopenia, unspecified: Secondary | ICD-10-CM

## 2013-11-16 DIAGNOSIS — A0472 Enterocolitis due to Clostridium difficile, not specified as recurrent: Secondary | ICD-10-CM | POA: Diagnosis not present

## 2013-11-16 LAB — COMPREHENSIVE METABOLIC PANEL
ALBUMIN: 1.7 g/dL — AB (ref 3.5–5.2)
ALT: 11 U/L (ref 0–53)
AST: 19 U/L (ref 0–37)
Alkaline Phosphatase: 38 U/L — ABNORMAL LOW (ref 39–117)
BILIRUBIN TOTAL: 0.3 mg/dL (ref 0.3–1.2)
BUN: 12 mg/dL (ref 6–23)
CHLORIDE: 102 meq/L (ref 96–112)
CO2: 29 mEq/L (ref 19–32)
CREATININE: 0.97 mg/dL (ref 0.50–1.35)
Calcium: 7.6 mg/dL — ABNORMAL LOW (ref 8.4–10.5)
GFR calc Af Amer: 89 mL/min — ABNORMAL LOW (ref >90)
GFR calc non Af Amer: 76 mL/min — ABNORMAL LOW (ref >90)
Glucose, Bld: 147 mg/dL — ABNORMAL HIGH (ref 70–99)
Potassium: 3.1 mEq/L — ABNORMAL LOW (ref 3.7–5.3)
SODIUM: 139 meq/L (ref 137–147)
Total Protein: 4.5 g/dL — ABNORMAL LOW (ref 6.0–8.3)

## 2013-11-16 LAB — GLUCOSE, CAPILLARY
GLUCOSE-CAPILLARY: 126 mg/dL — AB (ref 70–99)
GLUCOSE-CAPILLARY: 131 mg/dL — AB (ref 70–99)
GLUCOSE-CAPILLARY: 88 mg/dL (ref 70–99)
GLUCOSE-CAPILLARY: 79 mg/dL (ref 70–99)
Glucose-Capillary: 146 mg/dL — ABNORMAL HIGH (ref 70–99)
Glucose-Capillary: 126 mg/dL — ABNORMAL HIGH (ref 70–99)
Glucose-Capillary: 44 mg/dL — CL (ref 70–99)

## 2013-11-16 LAB — MAGNESIUM: Magnesium: 1.5 mg/dL (ref 1.5–2.5)

## 2013-11-16 LAB — PREPARE RBC (CROSSMATCH)

## 2013-11-16 LAB — CBC
HCT: 23.8 % — ABNORMAL LOW (ref 39.0–52.0)
HEMOGLOBIN: 7.7 g/dL — AB (ref 13.0–17.0)
MCH: 26.4 pg (ref 26.0–34.0)
MCHC: 32.4 g/dL (ref 30.0–36.0)
MCV: 81.5 fL (ref 78.0–100.0)
PLATELETS: 131 10*3/uL — AB (ref 150–400)
RBC: 2.92 MIL/uL — ABNORMAL LOW (ref 4.22–5.81)
RDW: 14.5 % (ref 11.5–15.5)
WBC: 8 10*3/uL (ref 4.0–10.5)

## 2013-11-16 LAB — HEMOGLOBIN AND HEMATOCRIT, BLOOD
HCT: 26.7 % — ABNORMAL LOW (ref 39.0–52.0)
Hemoglobin: 8.7 g/dL — ABNORMAL LOW (ref 13.0–17.0)

## 2013-11-16 MED ORDER — DEXTROSE 50 % IV SOLN: 25.0000 mL | Freq: Once | INTRAVENOUS | Status: AC | PRN

## 2013-11-16 MED ORDER — FUROSEMIDE 10 MG/ML IJ SOLN
20.0000 mg | Freq: Two times a day (BID) | INTRAMUSCULAR | Status: DC
  Administered 2013-11-16: 20 mg via INTRAVENOUS
  Filled 2013-11-16: qty 2

## 2013-11-16 MED ORDER — PHENYLEPHRINE HCL 10 MG/ML IJ SOLN: 30.0000 ug/min | INTRAVENOUS | Status: DC

## 2013-11-16 MED ORDER — FUROSEMIDE 10 MG/ML IJ SOLN
20.0000 mg | Freq: Every day | INTRAMUSCULAR | Status: DC
  Administered 2013-11-17 – 2013-11-18 (×2): 20 mg via INTRAVENOUS
  Filled 2013-11-16: qty 2

## 2013-11-16 MED ORDER — FAT EMULSION 20 % IV EMUL
250.0000 mL | INTRAVENOUS | Status: AC
  Administered 2013-11-16: 250 mL via INTRAVENOUS
  Filled 2013-11-16: qty 250

## 2013-11-16 MED ORDER — DEXTROSE 50 % IV SOLN
INTRAVENOUS | Status: AC
  Administered 2013-11-16: 25 mL
  Filled 2013-11-16: qty 50

## 2013-11-16 MED ORDER — FUROSEMIDE 10 MG/ML IJ SOLN
20.0000 mg | Freq: Once | INTRAMUSCULAR | Status: AC
  Administered 2013-11-16: 20 mg via INTRAVENOUS
  Filled 2013-11-16: qty 2

## 2013-11-16 MED ORDER — TRACE MINERALS CR-CU-F-FE-I-MN-MO-SE-ZN IV SOLN
INTRAVENOUS | Status: AC
  Administered 2013-11-16: 18:00:00 via INTRAVENOUS
  Filled 2013-11-16: qty 2000

## 2013-11-16 MED ORDER — POTASSIUM CHLORIDE 10 MEQ/100ML IV SOLN
10.0000 meq | INTRAVENOUS | Status: AC
  Administered 2013-11-16 (×6): 10 meq via INTRAVENOUS
  Filled 2013-11-16: qty 100

## 2013-11-16 MED ORDER — METRONIDAZOLE IN NACL 5-0.79 MG/ML-% IV SOLN
500.0000 mg | Freq: Three times a day (TID) | INTRAVENOUS | Status: DC
  Administered 2013-11-16 – 2013-11-18 (×7): 500 mg via INTRAVENOUS
  Filled 2013-11-16 (×7): qty 100

## 2013-11-16 NOTE — Progress Notes (Signed)
RT getting more secretion today. Pt has had 2 mucus plug at 1115 and 1 mucus plug at 805.

## 2013-11-16 NOTE — Progress Notes (Addendum)
[facility] NOTE  Pharmacy Consult for TPN Indication: SBO, malnutrition, hypoalbuminemia  Allergies  Allergen Reactions  . Solu-Medrol [Methylprednisolone Acetate] Other (See Comments)    Severe encephalopathy/delirium.   Patient Measurements: Height: 5\' 1"  (154.9 cm) Weight: 132 lb 4.4 oz (60 kg) IBW/kg (Calculated) : 52.3  Vital Signs: Temp: 99.7 F (37.6 C) (05/27 0400) Temp src: Axillary (05/27 0400) BP: 90/56 mmHg (05/27 0400) Pulse Rate: 89 (05/27 0400) Intake/Output from previous day: 05/26 0701 - 05/27 0700 In: 3292.5 [I.V.:72.5; IV Piggyback:1150; TPN:1350] Out: 2150 [Urine:2100; Emesis/NG output:50] Intake/Output from this shift:    Labs:  Recent Labs  11/14/13 0459 11/15/13 0402 11/16/13 0400  WBC 5.2 5.1 8.0  HGB 8.6* 8.3* 7.7*  HCT 26.8* 25.7* 23.8*  PLT 112* 130* 131*    Recent Labs  11/14/13 0459 11/15/13 0402 11/16/13 0400 11/16/13 0721  NA 144 145 139  --   K 3.7 3.0* 3.1*  --   CL 109 107 102  --   CO2 22 29 29   --   GLUCOSE 95 195* 147*  --   BUN 11 10 12   --   CREATININE 1.00 1.02 0.97  --   CALCIUM 7.9* 8.0* 7.6*  --   MG 1.7 1.6  --  1.5  PHOS  --  2.4  --   --   PROT  --  4.9* 4.5*  --   ALBUMIN  --  1.9* 1.7*  --   AST  --  16 19  --   ALT  --  10 11  --   ALKPHOS  --  50 38*  --   BILITOT  --  0.3 0.3  --   PREALBUMIN  --  5.0*  --   --   TRIG  --  95  --   --    Estimated Creatinine Clearance: 45.7 ml/min (by C-G formula based on Cr of 0.97).    Recent Labs  11/15/13 1926 11/15/13 2256 11/16/13 0313  GLUCAP 133* 96 146*   Medical History: Past Medical History  Diagnosis Date  . Hypertension   . Hypercholesterolemia   . Cancer     prostate   Medications:  Scheduled:  . antiseptic oral rinse  15 mL Mouth Rinse QID  . chlorhexidine  15 mL Mouth Rinse BID  . enoxaparin (LOVENOX) injection  40 mg Subcutaneous Q24H  . famotidine (PEPCID) IV  20 mg Intravenous Q12H  . insulin aspart  0-15  Units Subcutaneous 6 times per day  . ipratropium-albuterol  3 mL Nebulization Q4H  . piperacillin-tazobactam (ZOSYN)  IV  3.375 g Intravenous Q8H  . potassium chloride  10 mEq Intravenous Q1 Hr x 6  . sodium chloride  10-40 mL Intracatheter Q12H  . vancomycin  750 mg Intravenous Q12H   Insulin Requirements in the past 24 hours:  8 units  Current Nutrition:  titrating TPN  Assessment: 80yo M on ventilator with septic shock most likely related to aspiration pneumonia possibly secondary to SBO.   Severe malnutrition, hypoalbuminemia noted.  Prealbumin = 5 Renal function is stable.  I/O = + 1217 Potassium is low today & will be replenished.  No hx of DM, but blood glucose is elevated on morning Bmet. Dietician note reviewed and appreciated.  Per notes, currently pt is unresponsive.     Nutritional Goals:  (based on wt of 60Kg, in ICU on ventilator) Kcal: 1550-1700 daily  Protein: 90-105 grams daily  Fluid: 1.5-1.7 L daily Goal rate  for TPN = 83-90 ml/hrs based on tolerance and progress  Plan:  Increase Clinimix E 5/15 today to 52ml/hr.   Add regular insulin 15 units to TPN bag today Add MVI, Trace Elements to TPN daily Lipids daily with TPN Continue SSI with CBGs q4h    KCl 28mEq IV x6 runs today Monitor lytes, renal fxn, CBGs, Prealbumin and fluid status  Mikinzie Maciejewski A Thane Age 11/16/2013,7:46 AM

## 2013-11-16 NOTE — Progress Notes (Signed)
[facility] PROGRESS NOTE  Douglas Mckay BJS:[id number] DOB: [birth year] DOA: 21-Nov-2013 PCP: Marjo Bicker, MD  Summary:  This is a 80 year old gentleman who was admitted to the hospital with abdominal pain, distention, nausea and vomiting and found to have a small bowel obstruction with a right inguinal hernia. He was initially admitted to to a medical bed under the surgical service, NG tube was placed for decompression. Shortly after admission, patient became increasingly tachypneic, hypoxic and less responsive. He was placed on BiPAP therapy, but was not appearing to tolerate this. Ultimately, he required transfer to the ICU, with intubation for airway management and severe hypoxia. Hospitalist service assumed care as his primary attending. Chest x-ray indicated aspiration pneumonia. He was started on empiric antibiotics. He subsequently went into septic shock requiring IV fluids and vasopressors. His blood pressure has since stabilized and he has been weaned off of vasopressors. He is continued on antibiotics. Weaning from the ventilator has been difficult due to thick secretions and agitation during the weaning process. He is undergoing daily weaning trials. Surgery continues to follow for small bowel obstruction, but this will have to be further addressed once he is stabilized from a pulmonary standpoint.   Assessment/Plan: 1. Septic shock. Likely associated with aspiration pneumonia and now new-onset severe rectosigmoid colitis, possibly pseudomembranous colitis. Patient was aggressively hydrated with IV fluids and initially required vasopressor support.  He was gradually able to wean off pressors and has not required pressors in 72 hours. He has been on Zosyn and vancomycin for 7 days, but given the new finding of pseudomembranous colitis, will discontinue Zosyn and add metronidazole which will give anaerobic coverage for aspiration pneumonia. antibiotics. Blood cultures have shown no  growth to date.  2. New severe pseudomembranous colitis, per followup CT scan of the abdomen and pelvis on 5/26. His ongoing fever could be associated with colitis. We'll start metronidazole and stop Zosyn. We'll check stool for C. difficile. 3. Aspiration pneumonia. Likely related to bowel obstruction. As above, will discontinue Zosyn, continue vancomycin, and add metronidazole.  Chest xray today shows worsening diffuse airspace disease, most consistent with edema. Will start gentle IV Lasix.  EF in normal range on echo.   Sputum culture pending. 4. Acute respiratory failure, likely due to pneumonia. Patient required intubation on 5/20 when he failed bipap treatment. Pulmonology following. Will try and wean from vent as tolerated.  5. Small bowel obstruction with right inguinal hernia. Followup CT scan of the abdomen and pelvis on 5/26 revealed persistent right inguinal hernia with associated small bowel distention and gastric distention. Surgery is following.  Continuing NG tube decompression.  6. Severe protein calorie malnutrition and hypoalbuminemia. Related to small bowel obstruction and prolonged NPO.  Started on TPN on 5/25 7. Acute encephalopathy. CT scan of the head without contrast on 5/26 revealed no acute intracranial abnormality; specifically no evidence of acute ischemia. His ongoing encephalopathy is likely secondary to acute illness, medications, ICU setting. He is moving all of his extremities with provocation. Will re- evaluate once patient is able to extubate. 8. Hypokalemia. Replacement/repletion discussed with pharmacy. Potassium runs have been ordered per pharmacy and adjustments will be made in the TPN. We'll also order a magnesium level to rule out deficiency. 9. Anemia. The patient's hemoglobin continues to drift downward despite no obvious evidence of melena/hematochezia/hematemesis. This is likely secondary to venipunctures and possibly volume overload from resuscitation and  related to critical illness.  Will continue to follow hemoglobin and consider transfusing if his hemoglobin falls  below 7.0.  10. Thrombocytopenia. His platelet count is trending upward. The thrombocytopenia is likely associated with infection/sepsis. His vitamin B12 and TSH were within normal limits in February 2015.  11. Anasarca. Likely secondary to volume resuscitation. 2-D echocardiogram revealed a normal left ventricular ejection fraction. We'll start IV Lasix at 20 mg IV every 12 hours x24 hours and then adjust as needed.    Code Status: full code Family Communication: discussed with wife at the bedside Disposition Plan: home vs. Snf, pending hospital course   Consultants:  Gen Surgery  Pulmonology  Procedures:  Intubation 5/20>>  Central line placement 5/20>>  Echo: - Left ventricle: The cavity size was normal. Wall thickness was normal. Systolic function was vigorous. The estimated ejection fraction was in the range of 65% to 70%. Wall motion was normal; there were no regional wall motion abnormalities. - Right ventricle: The cavity size was mildly dilated. - Impressions: Technically limited study due to poor sound wave transmission.    Antibiotics:  Metronidazole 5/27>>  Vancomycin 5/20>>  Zosyn 5/20>> 5/27  HPI/Subjective: 80 year old African-American man sedated and ventilated. His wife is in the room, questions answered.  Objective: Filed Vitals:   11/16/13 0752  BP:   Pulse:   Temp: 99.9 F (37.7 C)  Resp:    MAXIMUM TEMPERATURE 101.2. Current temperature 99.9. Pulse 101. Respiratory rate 13. Blood pressure 143/82. Oxygen saturation 99% on room air.   Intake/Output Summary (Last 24 hours) at 11/16/13 0820 Last data filed at 11/16/13 0700  Gross per 24 hour  Intake   3380 ml  Output   2150 ml  Net   1230 ml   Filed Weights   11/14/13 0330 11/15/13 0500 11/16/13 0500  Weight: 70 kg (154 lb 5.2 oz) 59.9 kg (132 lb 0.9 oz) 60 kg (132 lb 4.4 oz)     Exam:   General:  Sedated and ventilated, but he grimaces at times.  Cardiovascular: S1, S2 with tachycardia.  Respiratory: rhonchi bilaterally  Abdomen: Hypoactive bowel sounds, mildly to moderately distended and tympanic, grimaces with deep palpation, no hepatosplenomegaly. Right inguinal hernia palpated with grimaces from the patient.  Musculoskeletal:  Trace pedal edema bilaterally.  Neurologic: He is sedated, but he grimaces and moves his arms and his legs symmetrically with provocation.   Data Reviewed: Basic Metabolic Panel:  Recent Labs Lab 11/12/13 0513 11/13/13 0518 11/14/13 0459 11/15/13 0402 11/16/13 0400 11/16/13 0721  NA 146 145 144 145 139  --   K 3.6* 3.5* 3.7 3.0* 3.1*  --   CL 114* 113* 109 107 102  --   CO2 22 23 22 29 29   --   GLUCOSE 79 86 95 195* 147*  --   BUN 16 14 11 10 12   --   CREATININE 1.28 1.13 1.00 1.02 0.97  --   CALCIUM 7.8* 7.9* 7.9* 8.0* 7.6*  --   MG  --   --  1.7 1.6  --  1.5  PHOS  --   --   --  2.4  --   --    Liver Function Tests:  Recent Labs Lab 11/13/13 0518 11/15/13 0402 11/16/13 0400  AST 13 16 19   ALT 8 10 11   ALKPHOS 44 50 38*  BILITOT 0.4 0.3 0.3  PROT 4.8* 4.9* 4.5*  ALBUMIN 1.9* 1.9* 1.7*   No results found for this basename: LIPASE, AMYLASE,  in the last 168 hours No results found for this basename: AMMONIA,  in the last 168 hours  CBC:  Recent Labs Lab 11/12/13 0513 11/13/13 0518 11/13/13 1450 11/14/13 0459 11/15/13 0402 11/16/13 0400  WBC 8.1 6.3  --  5.2 5.1 8.0  NEUTROABS  --   --   --   --  4.0  --   HGB 9.1* 8.4* 9.0* 8.6* 8.3* 7.7*  HCT 28.5* 25.9* 27.8* 26.8* 25.7* 23.8*  MCV 84.1 82.5  --  83.2 81.6 81.5  PLT 97* 102*  --  112* 130* 131*   Cardiac Enzymes: No results found for this basename: CKTOTAL, CKMB, CKMBINDEX, TROPONINI,  in the last 168 hours BNP (last 3 results)  Recent Labs  11/09/13 2247 11/12/13 0500  PROBNP 1241.0* 529.2*   CBG:  Recent Labs Lab  11/15/13 1603 11/15/13 1926 11/15/13 2256 11/16/13 0313 11/16/13 0726  GLUCAP 124* 133* 96 146* 126*    Recent Results (from the past 240 hour(s))  MRSA PCR SCREENING     Status: None   Collection Time    11/09/13  6:15 PM      Result Value Ref Range Status   MRSA by PCR NEGATIVE  NEGATIVE Final   Comment:            The GeneXpert MRSA Assay (FDA     approved for NASAL specimens     only), is one component of a     comprehensive MRSA colonization     surveillance program. It is not     intended to diagnose MRSA     infection nor to guide or     monitor treatment for     MRSA infections.  CULTURE, BLOOD (ROUTINE X 2)     Status: None   Collection Time    11/09/13 10:47 PM      Result Value Ref Range Status   Specimen Description BLOOD LEFT WRIST   Final   Special Requests     Final   Value: BOTTLES DRAWN AEROBIC AND ANAEROBIC AEB=6CC ANA=4CC   Culture NO GROWTH 5 DAYS   Final   Report Status 11/14/2013 FINAL   Final  CULTURE, BLOOD (ROUTINE X 2)     Status: None   Collection Time    11/09/13 10:47 PM      Result Value Ref Range Status   Specimen Description BLOOD CENTRAL LINE DRAWN BY RN   Final   Special Requests BOTTLES DRAWN AEROBIC AND ANAEROBIC Bear   Final   Culture NO GROWTH 5 DAYS   Final   Report Status 11/14/2013 FINAL   Final  CULTURE, EXPECTORATED SPUTUM-ASSESSMENT     Status: None   Collection Time    11/15/13  8:38 PM      Result Value Ref Range Status   Specimen Description SPUTUM   Final   Special Requests NONE   Final   Sputum evaluation     Final   Value: THIS SPECIMEN IS ACCEPTABLE. RESPIRATORY CULTURE REPORT TO FOLLOW.     PERFORMED AT [facility]   Report Status 11/15/2013 FINAL   Final     Studies: Ct Abdomen Pelvis Wo Contrast  11/15/2013   CLINICAL DATA:  Small-bowel obstruction.  EXAM: CT ABDOMEN AND PELVIS WITHOUT CONTRAST  TECHNIQUE: Multidetector CT imaging of the abdomen and pelvis was performed following the standard protocol without IV  contrast.  COMPARISON:  10/27/2013.  FINDINGS: Tiny cysts noted in the liver. Liver otherwise unremarkable. Spleen unremarkable. Pancreas unremarkable. No biliary distention. Gallbladder is nondistended.  Adrenals unremarkable. No focal significant renal abnormality. Sandlike stone right  renal collecting system. No evidence of obstructing ureteral stone. Foley catheter is in the bladder. Prostate is unremarkable .  No significant adenopathy.  No aneurysm.  Right inguinal hernia is present with dilated loops of small bowel and stomach. Prominent hiatal hernia. Prominent thickening of the rectosigmoid wall is noted. This could be from colitis. This is a new finding. Pseudomembranous colitis is not polyp is a concern. Other etiologies of bowel wall edema cannot be excluded. No free air is identified.  No other significant abdominal wall hernias are noted. Diffuse anasarca. Coronary artery disease. Bibasilar atelectasis small pleural effusions. Degenerative changes noted of the lumbar spine and both hips.  IMPRESSION: 1. Persistent right inguinal hernia with associated small bowel distention and gastric distention. Suggest the presence of small-bowel obstruction. 2. Severe new onset of thickening of the rectosigmoid colonic wall. This suggests colitis possibly pseudomembranous colitis. 3. Best bibasilar atelectasis and/or pneumonia with bilateral pleural effusions. 4. Anasarca.   Electronically Signed   By: Marcello Moores  Register   On: 11/15/2013 17:07   Ct Head Wo Contrast  11/15/2013   CLINICAL DATA:  Ventilator dependence with mental status changes.  EXAM: CT HEAD WITHOUT CONTRAST  TECHNIQUE: Contiguous axial images were obtained from the base of the skull through the vertex without intravenous contrast.  COMPARISON:  08/01/2013  FINDINGS: There is no evidence for acute hemorrhage, hydrocephalus, mass lesion, or abnormal extra-axial fluid collection. No definite CT evidence for acute infarction. Diffuse loss of  parenchymal volume is consistent with atrophy. The visualized paranasal sinuses and mastoid air cells are clear.  IMPRESSION: Stable. No acute intracranial abnormality. Specifically, no evidence for acute ischemia on the current exam.   Electronically Signed   By: Misty Stanley M.D.   On: 11/15/2013 16:55   Dg Chest Port 1 View  11/15/2013   CLINICAL DATA:  Respiratory failure  EXAM: PORTABLE CHEST - 1 VIEW  COMPARISON:  11/14/2013  FINDINGS: Cardiac shadow is stable. A right-sided central venous line, nasogastric catheter and endotracheal tube are identified. The endotracheal tube is approximately 5.7 cm above the carina. Nasogastric catheter is withdrawn and now lies within the distal stomach. This could be advanced somewhat. Bilateral patchy infiltrates are again identified with slight increase on the left. No bony abnormality is noted. Nipple shadows are noted bilaterally.  IMPRESSION: Slight increase in left perihilar infiltrate. The remainder of the infiltrative densities are stable.  Tubes and lines as described. The nasogastric catheter should be advanced into the stomach.   Electronically Signed   By: Inez Catalina M.D.   On: 11/15/2013 08:07    Scheduled Meds: . antiseptic oral rinse  15 mL Mouth Rinse QID  . chlorhexidine  15 mL Mouth Rinse BID  . enoxaparin (LOVENOX) injection  40 mg Subcutaneous Q24H  . famotidine (PEPCID) IV  20 mg Intravenous Q12H  . insulin aspart  0-15 Units Subcutaneous 6 times per day  . ipratropium-albuterol  3 mL Nebulization Q4H  . piperacillin-tazobactam (ZOSYN)  IV  3.375 g Intravenous Q8H  . potassium chloride  10 mEq Intravenous Q1 Hr x 6  . sodium chloride  10-40 mL Intracatheter Q12H  . vancomycin  750 mg Intravenous Q12H   Continuous Infusions: . Marland KitchenTPN (CLINIMIX-E) Adult 50 mL/hr at 11/16/13 0700   And  . fat emulsion 500 kcal (11/16/13 0300)  . fentaNYL infusion INTRAVENOUS 50 mcg/hr (11/16/13 0700)  . phenylephrine (NEO-SYNEPHRINE) Adult infusion  Stopped (11/12/13 2335)    Principal Problem:   Septic shock Active Problems:  Acute respiratory failure with hypoxia   Small bowel obstruction   Pseudomembranous colitis   SBO (small bowel obstruction)   HTN (hypertension), benign   Acute encephalopathy   Aspiration pneumonia   Protein-calorie malnutrition, severe   Thrombocytopenia, unspecified    Time spent: critical care: 58mins    Rexene Alberts M.D. [facility] Pager [phone]. If 7PM-7AM, please contact night-coverage at [email or web address], password [facility] 11/16/2013, 8:20 AM  LOS: 8 days

## 2013-11-16 NOTE — Progress Notes (Signed)
Subjective: He has done better with the weaning process this morning. However he has a new problem which is what appears to be probable C. difficile colitis. This is likely related to his antibiotics.  Objective: Vital signs in last 24 hours: Temp:  [98.2 F (36.8 C)-101.2 F (38.4 C)] 99.9 F (37.7 C) (05/27 0752) Pulse Rate:  [29-122] 103 (05/27 0830) Resp:  [11-21] 12 (05/27 0830) BP: (86-173)/(56-119) 111/66 mmHg (05/27 0830) SpO2:  [84 %-100 %] 97 % (05/27 0830) FiO2 (%):  [40 %] 40 % (05/27 0704) Weight:  [60 kg (132 lb 4.4 oz)] 60 kg (132 lb 4.4 oz) (05/27 0500) Weight change: 0.1 kg (3.5 oz) Last BM Date: 11/12/13  Intake/Output from previous day: 05/26 0701 - 05/27 0700 In: 3442.5 [I.V.:72.5; IV Piggyback:1250; TPN:1400] Out: 2150 [Urine:2100; Emesis/NG output:50]  PHYSICAL EXAM General appearance: Intubated sedated but weaning Resp: rhonchi bilaterally Cardio: regular rate and rhythm, S1, S2 normal, no murmur, click, rub or gallop GI: Still distended Extremities: Some third spacing of fluid  Lab Results:    Basic Metabolic Panel:  Recent Labs  11/15/13 0402 11/16/13 0400 11/16/13 0721  NA 145 139  --   K 3.0* 3.1*  --   CL 107 102  --   CO2 29 29  --   GLUCOSE 195* 147*  --   BUN 10 12  --   CREATININE 1.02 0.97  --   CALCIUM 8.0* 7.6*  --   MG 1.6  --  1.5  PHOS 2.4  --   --    Liver Function Tests:  Recent Labs  11/15/13 0402 11/16/13 0400  AST 16 19  ALT 10 11  ALKPHOS 50 38*  BILITOT 0.3 0.3  PROT 4.9* 4.5*  ALBUMIN 1.9* 1.7*   No results found for this basename: LIPASE, AMYLASE,  in the last 72 hours No results found for this basename: AMMONIA,  in the last 72 hours CBC:  Recent Labs  11/15/13 0402 11/16/13 0400  WBC 5.1 8.0  NEUTROABS 4.0  --   HGB 8.3* 7.7*  HCT 25.7* 23.8*  MCV 81.6 81.5  PLT 130* 131*   Cardiac Enzymes: No results found for this basename: CKTOTAL, CKMB, CKMBINDEX, TROPONINI,  in the last 72  hours BNP: No results found for this basename: PROBNP,  in the last 72 hours D-Dimer: No results found for this basename: DDIMER,  in the last 72 hours CBG:  Recent Labs  11/15/13 1132 11/15/13 1603 11/15/13 1926 11/15/13 2256 11/16/13 0313 11/16/13 0726  GLUCAP 146* 124* 133* 96 146* 126*   Hemoglobin A1C: No results found for this basename: HGBA1C,  in the last 72 hours Fasting Lipid Panel:  Recent Labs  11/15/13 0402  TRIG 95   Thyroid Function Tests: No results found for this basename: TSH, T4TOTAL, FREET4, T3FREE, THYROIDAB,  in the last 72 hours Anemia Panel: No results found for this basename: VITAMINB12, FOLATE, FERRITIN, TIBC, IRON, RETICCTPCT,  in the last 72 hours Coagulation: No results found for this basename: LABPROT, INR,  in the last 72 hours Urine Drug Screen: Drugs of Abuse  No results found for this basename: labopia, cocainscrnur, labbenz, amphetmu, thcu, labbarb    Alcohol Level: No results found for this basename: ETH,  in the last 72 hours Urinalysis: No results found for this basename: COLORURINE, APPERANCEUR, LABSPEC, PHURINE, GLUCOSEU, HGBUR, BILIRUBINUR, KETONESUR, PROTEINUR, UROBILINOGEN, NITRITE, LEUKOCYTESUR,  in the last 72 hours Misc. Labs:  ABGS  Recent Labs  11/14/13 0500  PHART 7.351  PO2ART 81.7  TCO2 19.0  HCO3 20.6   CULTURES Recent Results (from the past 240 hour(s))  MRSA PCR SCREENING     Status: None   Collection Time    11/09/13  6:15 PM      Result Value Ref Range Status   MRSA by PCR NEGATIVE  NEGATIVE Final   Comment:            The GeneXpert MRSA Assay (FDA     approved for NASAL specimens     only), is one component of a     comprehensive MRSA colonization     surveillance program. It is not     intended to diagnose MRSA     infection nor to guide or     monitor treatment for     MRSA infections.  CULTURE, BLOOD (ROUTINE X 2)     Status: None   Collection Time    11/09/13 10:47 PM      Result Value  Ref Range Status   Specimen Description BLOOD LEFT WRIST   Final   Special Requests     Final   Value: BOTTLES DRAWN AEROBIC AND ANAEROBIC AEB=6CC ANA=4CC   Culture NO GROWTH 5 DAYS   Final   Report Status 11/14/2013 FINAL   Final  CULTURE, BLOOD (ROUTINE X 2)     Status: None   Collection Time    11/09/13 10:47 PM      Result Value Ref Range Status   Specimen Description BLOOD CENTRAL LINE DRAWN BY RN   Final   Special Requests BOTTLES DRAWN AEROBIC AND ANAEROBIC [place]   Final   Culture NO GROWTH 5 DAYS   Final   Report Status 11/14/2013 FINAL   Final  CULTURE, EXPECTORATED SPUTUM-ASSESSMENT     Status: None   Collection Time    11/15/13  8:38 PM      Result Value Ref Range Status   Specimen Description SPUTUM   Final   Special Requests NONE   Final   Sputum evaluation     Final   Value: THIS SPECIMEN IS ACCEPTABLE. RESPIRATORY CULTURE REPORT TO FOLLOW.     PERFORMED AT [facility]   Report Status 11/15/2013 FINAL   Final   Studies/Results: Ct Abdomen Pelvis Wo Contrast  11/15/2013   CLINICAL DATA:  Small-bowel obstruction.  EXAM: CT ABDOMEN AND PELVIS WITHOUT CONTRAST  TECHNIQUE: Multidetector CT imaging of the abdomen and pelvis was performed following the standard protocol without IV contrast.  COMPARISON:  11/01/2013.  FINDINGS: Tiny cysts noted in the liver. Liver otherwise unremarkable. Spleen unremarkable. Pancreas unremarkable. No biliary distention. Gallbladder is nondistended.  Adrenals unremarkable. No focal significant renal abnormality. Sandlike stone right renal collecting system. No evidence of obstructing ureteral stone. Foley catheter is in the bladder. Prostate is unremarkable .  No significant adenopathy.  No aneurysm.  Right inguinal hernia is present with dilated loops of small bowel and stomach. Prominent hiatal hernia. Prominent thickening of the rectosigmoid wall is noted. This could be from colitis. This is a new finding. Pseudomembranous colitis is not polyp is a  concern. Other etiologies of bowel wall edema cannot be excluded. No free air is identified.  No other significant abdominal wall hernias are noted. Diffuse anasarca. Coronary artery disease. Bibasilar atelectasis small pleural effusions. Degenerative changes noted of the lumbar spine and both hips.  IMPRESSION: 1. Persistent right inguinal hernia with associated small bowel distention and gastric distention. Suggest the presence of small-bowel  obstruction. 2. Severe new onset of thickening of the rectosigmoid colonic wall. This suggests colitis possibly pseudomembranous colitis. 3. Best bibasilar atelectasis and/or pneumonia with bilateral pleural effusions. 4. Anasarca.   Electronically Signed   By: Marcello Moores  Register   On: 11/15/2013 17:07   Ct Head Wo Contrast  11/15/2013   CLINICAL DATA:  Ventilator dependence with mental status changes.  EXAM: CT HEAD WITHOUT CONTRAST  TECHNIQUE: Contiguous axial images were obtained from the base of the skull through the vertex without intravenous contrast.  COMPARISON:  08/01/2013  FINDINGS: There is no evidence for acute hemorrhage, hydrocephalus, mass lesion, or abnormal extra-axial fluid collection. No definite CT evidence for acute infarction. Diffuse loss of parenchymal volume is consistent with atrophy. The visualized paranasal sinuses and mastoid air cells are clear.  IMPRESSION: Stable. No acute intracranial abnormality. Specifically, no evidence for acute ischemia on the current exam.   Electronically Signed   By: Misty Stanley M.D.   On: 11/15/2013 16:55   Dg Chest Port 1 View  11/16/2013   CLINICAL DATA:  Respiratory failure l  EXAM: PORTABLE CHEST - 1 VIEW  COMPARISON:  Portable chest x-ray of 11/15/2013  FINDINGS: There has been worsening of diffuse airspace disease bilaterally most consistent with edema. Pneumonia cannot be excluded. No effusion is seen. The endotracheal tube tip is difficult to visualize, approximately 3.2 cm above the carina. Right  central venous line tip overlies the lower SVC. The heart is unchanged in size.  IMPRESSION: 1. Worsening of diffuse airspace disease most consistent with edema. 2. No change in position of endotracheal tube and right central venous line.   Electronically Signed   By: Ivar Drape M.D.   On: 11/16/2013 08:21   Dg Chest Port 1 View  11/15/2013   CLINICAL DATA:  Respiratory failure  EXAM: PORTABLE CHEST - 1 VIEW  COMPARISON:  11/14/2013  FINDINGS: Cardiac shadow is stable. A right-sided central venous line, nasogastric catheter and endotracheal tube are identified. The endotracheal tube is approximately 5.7 cm above the carina. Nasogastric catheter is withdrawn and now lies within the distal stomach. This could be advanced somewhat. Bilateral patchy infiltrates are again identified with slight increase on the left. No bony abnormality is noted. Nipple shadows are noted bilaterally.  IMPRESSION: Slight increase in left perihilar infiltrate. The remainder of the infiltrative densities are stable.  Tubes and lines as described. The nasogastric catheter should be advanced into the stomach.   Electronically Signed   By: Inez Catalina M.D.   On: 11/15/2013 08:07    Medications:  Prior to Admission:  Prescriptions prior to admission  Medication Sig Dispense Refill  . acetaminophen (TYLENOL) 325 MG tablet Take 2 tablets (650 mg total) by mouth every 6 (six) hours as needed for mild pain (or Fever >/= 101).      Marland Kitchen amLODipine-benazepril (LOTREL) 5-40 MG per capsule Take 1 capsule by mouth daily.      . carvedilol (COREG) 3.125 MG tablet Take 3.125 mg by mouth daily.      . simvastatin (ZOCOR) 20 MG tablet Take 20 mg by mouth every evening.       Scheduled: . antiseptic oral rinse  15 mL Mouth Rinse QID  . chlorhexidine  15 mL Mouth Rinse BID  . enoxaparin (LOVENOX) injection  40 mg Subcutaneous Q24H  . famotidine (PEPCID) IV  20 mg Intravenous Q12H  . furosemide  20 mg Intravenous Q12H  . insulin aspart  0-15  Units Subcutaneous 6 times per  day  . ipratropium-albuterol  3 mL Nebulization Q4H  . metronidazole  500 mg Intravenous Q8H  . potassium chloride  10 mEq Intravenous Q1 Hr x 6  . sodium chloride  10-40 mL Intracatheter Q12H  . vancomycin  750 mg Intravenous Q12H   Continuous: . Marland KitchenTPN (CLINIMIX-E) Adult 50 mL/hr at 11/16/13 0700   And  . fat emulsion 500 kcal (11/16/13 0300)  . fentaNYL infusion INTRAVENOUS 50 mcg/hr (11/16/13 0700)  . phenylephrine (NEO-SYNEPHRINE) Adult infusion Stopped (11/12/13 2335)   RXV:[id number], acetaminophen, albuterol, fentaNYL, fentaNYL, fentaNYL, LORazepam, naLOXone (NARCAN)  injection, ondansetron, sodium chloride, sodium chloride  Assesment: He was admitted with small bowel obstruction. He aspirated after admission developed aspiration pneumonia and sepsis. He is improving from lung point of view but has a new problem which appears to be pseudomembranous colitis. He has been off  Of vaso pressors and is doing much better with weaning but I'm concerned about trying to extubate him because of the new problem with colitis Principal Problem:   Septic shock Active Problems:   SBO (small bowel obstruction)   HTN (hypertension), benign   Acute respiratory failure with hypoxia   Acute encephalopathy   Aspiration pneumonia   Small bowel obstruction   Protein-calorie malnutrition, severe   Thrombocytopenia, unspecified   Pseudomembranous colitis    Plan: I will let him breathe some on his own. We'll continue with current treatments. Agree with giving him some Lasix. No extubation today    LOS: 8 days   Alonza Bogus 11/16/2013, 9:03 AM

## 2013-11-16 NOTE — Progress Notes (Signed)
CT scan of abdomen and pelvis results noted. Abdominal examination unchanged. Right inguinal hernia easily reducible. Patient has other more pressing issues at this time. We'll continue to follow with you as needed.

## 2013-11-17 DIAGNOSIS — E161 Other hypoglycemia: Secondary | ICD-10-CM

## 2013-11-17 DIAGNOSIS — T383X5A Adverse effect of insulin and oral hypoglycemic [antidiabetic] drugs, initial encounter: Secondary | ICD-10-CM

## 2013-11-17 DIAGNOSIS — E160 Drug-induced hypoglycemia without coma: Secondary | ICD-10-CM | POA: Diagnosis not present

## 2013-11-17 DIAGNOSIS — D649 Anemia, unspecified: Secondary | ICD-10-CM

## 2013-11-17 LAB — PHOSPHORUS: Phosphorus: 3.1 mg/dL (ref 2.3–4.6)

## 2013-11-17 LAB — COMPREHENSIVE METABOLIC PANEL
ALT: 22 U/L (ref 0–53)
AST: 37 U/L (ref 0–37)
Albumin: 2.2 g/dL — ABNORMAL LOW (ref 3.5–5.2)
Alkaline Phosphatase: 53 U/L (ref 39–117)
BUN: 14 mg/dL (ref 6–23)
CALCIUM: 8 mg/dL — AB (ref 8.4–10.5)
CO2: 31 mEq/L (ref 19–32)
CREATININE: 1.02 mg/dL (ref 0.50–1.35)
Chloride: 97 mEq/L (ref 96–112)
GFR calc Af Amer: 79 mL/min — ABNORMAL LOW (ref >90)
GFR calc non Af Amer: 68 mL/min — ABNORMAL LOW (ref >90)
GLUCOSE: 123 mg/dL — AB (ref 70–99)
Potassium: 3.3 mEq/L — ABNORMAL LOW (ref 3.7–5.3)
Sodium: 140 mEq/L (ref 137–147)
TOTAL PROTEIN: 5.5 g/dL — AB (ref 6.0–8.3)
Total Bilirubin: 0.6 mg/dL (ref 0.3–1.2)

## 2013-11-17 LAB — CBC
HCT: 33.4 % — ABNORMAL LOW (ref 39.0–52.0)
Hemoglobin: 11 g/dL — ABNORMAL LOW (ref 13.0–17.0)
MCH: 27 pg (ref 26.0–34.0)
MCHC: 32.9 g/dL (ref 30.0–36.0)
MCV: 81.9 fL (ref 78.0–100.0)
PLATELETS: 186 10*3/uL (ref 150–400)
RBC: 4.08 MIL/uL — ABNORMAL LOW (ref 4.22–5.81)
RDW: 14.4 % (ref 11.5–15.5)
WBC: 9 10*3/uL (ref 4.0–10.5)

## 2013-11-17 LAB — GLUCOSE, CAPILLARY
GLUCOSE-CAPILLARY: 149 mg/dL — AB (ref 70–99)
GLUCOSE-CAPILLARY: 100 mg/dL — AB (ref 70–99)
Glucose-Capillary: 104 mg/dL — ABNORMAL HIGH (ref 70–99)
Glucose-Capillary: 115 mg/dL — ABNORMAL HIGH (ref 70–99)
Glucose-Capillary: 142 mg/dL — ABNORMAL HIGH (ref 70–99)
Glucose-Capillary: 167 mg/dL — ABNORMAL HIGH (ref 70–99)
Glucose-Capillary: 171 mg/dL — ABNORMAL HIGH (ref 70–99)

## 2013-11-17 LAB — BLOOD GAS, ARTERIAL
Acid-Base Excess: 6.8 mmol/L — ABNORMAL HIGH (ref 0.0–2.0)
BICARBONATE: 30.5 meq/L — AB (ref 20.0–24.0)
Drawn by: 21310
FIO2: 0.4 %
MECHVT: 500 mL
O2 SAT: 98.9 %
PEEP: 5 cmH2O
PO2 ART: 128 mmHg — AB (ref 80.0–100.0)
Patient temperature: 37
RATE: 15 resp/min
TCO2: 27.1 mmol/L (ref 0–100)
pCO2 arterial: 40.7 mmHg (ref 35.0–45.0)
pH, Arterial: 7.487 — ABNORMAL HIGH (ref 7.350–7.450)

## 2013-11-17 LAB — MAGNESIUM: MAGNESIUM: 1.6 mg/dL (ref 1.5–2.5)

## 2013-11-17 MED ORDER — LORAZEPAM BOLUS VIA INFUSION: 2.0000 mg | INTRAVENOUS | Status: DC | PRN

## 2013-11-17 MED ORDER — MORPHINE SULFATE 4 MG/ML IJ SOLN
5.0000 mg | INTRAMUSCULAR | Status: DC | PRN
  Filled 2013-11-17: qty 5

## 2013-11-17 MED ORDER — ALTEPLASE 2 MG IJ SOLR
INTRAMUSCULAR | Status: AC
  Filled 2013-11-17: qty 2

## 2013-11-17 MED ORDER — ALTEPLASE 100 MG IV SOLR
2.0000 mg | Freq: Once | INTRAVENOUS | Status: AC
  Administered 2013-11-17: 2 mg
  Filled 2013-11-17: qty 2

## 2013-11-17 MED ORDER — MORPHINE BOLUS VIA INFUSION: 5.0000 mg | INTRAVENOUS | Status: DC | PRN

## 2013-11-17 MED ORDER — FAT EMULSION 20 % IV EMUL
250.0000 mL | INTRAVENOUS | Status: AC
  Administered 2013-11-17: 250 mL via INTRAVENOUS
  Filled 2013-11-17: qty 250

## 2013-11-17 MED ORDER — POTASSIUM CHLORIDE 10 MEQ/100ML IV SOLN
10.0000 meq | INTRAVENOUS | Status: AC
  Administered 2013-11-17 (×6): 10 meq via INTRAVENOUS
  Filled 2013-11-17: qty 100

## 2013-11-17 MED ORDER — ARTIFICIAL TEARS OP OINT
TOPICAL_OINTMENT | Freq: Four times a day (QID) | OPHTHALMIC | Status: DC
  Administered 2013-11-17 – 2013-11-18 (×5): via OPHTHALMIC
  Filled 2013-11-17: qty 3.5

## 2013-11-17 MED ORDER — LORAZEPAM 2 MG/ML IJ SOLN
2.0000 mg | INTRAMUSCULAR | Status: DC | PRN
  Administered 2013-11-17: 2 mg via INTRAVENOUS

## 2013-11-17 MED ORDER — MORPHINE SULFATE 10 MG/ML IJ SOLN
5.0000 mg | INTRAMUSCULAR | Status: DC | PRN
  Administered 2013-11-17: 10 mg via INTRAVENOUS
  Administered 2013-11-18 (×4): 5 mg via INTRAVENOUS
  Filled 2013-11-17 (×5): qty 1

## 2013-11-17 MED ORDER — TRACE MINERALS CR-CU-F-FE-I-MN-MO-SE-ZN IV SOLN
INTRAVENOUS | Status: AC
  Administered 2013-11-17: 18:00:00 via INTRAVENOUS
  Filled 2013-11-17: qty 2000

## 2013-11-17 NOTE — Progress Notes (Addendum)
[facility] PROGRESS NOTE  Hildred Wicke [id number] DOB: [birth year] DOA: 11/02/2013 PCP: Marjo Bicker, MD  Summary:  This is a 80 year old gentleman who was admitted to the hospital with abdominal pain, distention, nausea and vomiting and found to have a small bowel obstruction with a right inguinal hernia. He was initially admitted to to a medical bed under the surgical service, NG tube was placed for decompression. Shortly after admission, patient became increasingly tachypneic, hypoxic and less responsive. He was placed on BiPAP therapy, but was not appearing to tolerate this. Ultimately, he required transfer to the ICU, with intubation for airway management and severe hypoxia. Hospitalist service assumed care as his primary attending. Chest x-ray indicated aspiration pneumonia. He was started on empiric antibiotics. He subsequently went into septic shock requiring IV fluids and vasopressors. His blood pressure stabilized and he was weaned off of vasopressors. He is continued on antibiotics. Weaning from the ventilator has been difficult due to thick secretions and agitation during the weaning process. He is undergoing daily weaning trials. Surgery continues to follow for small bowel obstruction, but this will have to be further addressed once he is stabilized from a pulmonary standpoint.   In the interim, the patient became hypotensive again and developed a low-grade fever. Followup CT of his abdomen/pelvis on 5/26 revealed severe new onset of thickening of the rectosigmoid colon suggesting pseudomembranous colitis. Due to this finding, Zosyn was discontinued in favor of metronidazole which would also cover aspiration pneumonia. He continues on vancomycin. He was restarted on Neo-Synephrine  and given 2 units of packed red blood cells for blood pressure support on 5/27. His hemoglobin fell to 7.7 without any obvious source of bleeding. The decrease in hemoglobin is felt to be secondary  to severe illness. The cause of his poor prognosis and not improving clinically, Dr. Luan Pulling and I had a conversation with the patient's family regarding his poor prognosis. Following the conversation, it was decided that the patient would be extubated today on 5/28 without reintubation. We will continue to provide supportive treatment and treatment of his acute illnesses, but if it looks as if he is not improving without ventilator support, then he will be transitioned to comfort care.    Assessment/Plan: 1. Septic shock. Likely associated with aspiration pneumonia and now new-onset severe rectosigmoid colitis, possibly pseudomembranous colitis. Patient was aggressively hydrated with IV fluids and initially required vasopressor support.  He was gradually able to wean off pressors, but Neo-Synephrine had to be restarted on 5/27.  He had been on Zosyn and vancomycin for 7 days, but given the new finding of pseudomembranous colitis, Zosyn was discontinued and metronidazole added which will give anaerobic coverage for aspiration pneumonia as well. Vancomycin is continued. Blood cultures have shown no growth to date.  2. New severe pseudomembranous colitis, per followup CT scan of the abdomen and pelvis on 5/26. His fever has resolved follow the addition of metronidazole.ing fever could be associated with colitis. C. difficile PCR pending.  3. Aspiration pneumonia. Likely related to bowel obstruction. As above, Zosyn was discontinued. Will continue vancomycin and metronidazole.  Chest xray on 5/27 revealed  shows worsening diffuse airspace disease, most consistent with edema. We'll continue gentle Lasix at 20 mg IV daily.   EF was normal range on echo.   Sputum culture pending. 4. Acute respiratory failure, likely due to pneumonia. Patient required intubation on 5/20 when he failed bipap treatment. The plan is to extubate the patient today with no plan to reintubate given his  poor prognosis and the probable need  for tracheostomy if mechanical ventilation would be desired by his wife, which is not. 5. Small bowel obstruction with right inguinal hernia. Followup CT scan of the abdomen and pelvis on 5/26 revealed persistent right inguinal hernia with associated small bowel distention and gastric distention. Surgery is following.  Continuing NG tube decompression.  6. Severe protein calorie malnutrition and hypoalbuminemia. Related to small bowel obstruction and prolonged NPO.  Started on TPN on 5/25 7. Acute encephalopathy. CT scan of the head without contrast on 5/26 revealed no acute intracranial abnormality; specifically no evidence of acute ischemia. His ongoing encephalopathy is likely secondary to acute illness, medications, ICU setting. He is moving all of his extremities with provocation. Will re- evaluate once patient is able to extubate. 8. Hypokalemia. Replacement/repletion discussed with pharmacy. Repletion/supplementation per pharmacy and adjustments will be made in the TPN. He is being given IV runs of potassium as needed. His magnesium level was borderline low, will therefore give him 1 g of magnesium sulfate. 9. Anemia. The patient's hemoglobin continues to drift downward despite no obvious evidence of melena/hematochezia/hematemesis. This is likely secondary to venipunctures and possibly volume overload from resuscitation and related to critical illness. On 5/27 he was transfused 2 units of packed red blood cells when his hemoglobin fell to 7.7 and he became more hypotensive. Again there were no obvious evidence of gross bleeding. Following the transfusions, his hemoglobin is at 11.0 today.   10. Thrombocytopenia. His platelet count has normalized. The thrombocytopenia was  likely associated with infection/sepsis. His vitamin B12 and TSH were within normal limits in February 2015.  11. Anasarca. Likely secondary to volume resuscitation. 2-D echocardiogram revealed a normal left ventricular ejection  fraction. continue IV Lasix at 20 mg IV every 24 hours and then adjust as needed.  12. Hypoglycemia associated with insulin in the TPN. Treated appropriately by the nursing staff. Will ask pharmacy to adjust insulin. 13. Poor prognosis. Dr. Luan Pulling and I discussed the patient's poor prognosis with his wife and other family members present. They were informed that he has not improve clinically despite vigorous treatment. We went on to say that if he were to remain intubated, he would require tracheostomy. His wife did not want this. She was in agreement to extubate him without any plans to reintubate him. She was in agreement with  comfort care if it appeared that he would not survive the extubation. In the meantime, she was informed that all other treatment would continue unless it appeared that his death was imminent and then he would be transitioned to complete comfort care. He is a DO NOT RESUSCITATE status.    Code Status: DO NOT RESUSCITATE  Family Communication: discussed with wife and other family members Disposition Plan: home vs. Snf, pending hospital course   Consultants:  Gen Surgery  Pulmonology  Procedures:  Intubation 5/20>>5/28  Central line placement 5/20>>  Echo: - Left ventricle: The cavity size was normal. Wall thickness was normal. Systolic function was vigorous. The estimated ejection fraction was in the range of 65% to 70%. Wall motion was normal; there were no regional wall motion abnormalities. - Right ventricle: The cavity size was mildly dilated. - Impressions: Technically limited study due to poor sound wave transmission.    Antibiotics:  Metronidazole 5/27>>  Vancomycin 5/20>>  Zosyn 5/20>> 5/27  HPI/Subjective: 80 year old African-American man sedated and ventilated. Essentially no change.   Objective: Filed Vitals:   11/17/13 0812  BP:   Pulse:  Temp: 99.5 F (37.5 C)  Resp:    MAXIMUM TEMPERATURE 99.9. Current temperature 99.5. Pulse  95. Respiratory rate 14. Blood pressure 143/82. Oxygen saturation 100% on the vent.   Intake/Output Summary (Last 24 hours) at 11/17/13 0847 Last data filed at 11/17/13 0600  Gross per 24 hour  Intake 4512.9 ml  Output   7075 ml  Net -2562.1 ml   Filed Weights   11/16/13 0500 11/16/13 2000 11/17/13 0500  Weight: 60 kg (132 lb 4.4 oz) 60 kg (132 lb 4.4 oz) 64.8 kg (142 lb 13.7 oz)    Exam:   General:  Sedated and ventilated, but he grimaces at times.  Cardiovascular: S1, S2 with tachycardia.  Respiratory: rhonchi bilaterally  Abdomen: Hypoactive bowel sounds, mildly to moderately distended and tympanic, grimaces with deep palpation, no hepatosplenomegaly. Right inguinal hernia palpated with grimaces from the patient.  Musculoskeletal:  Trace pedal edema bilaterally.  Neurologic: He is sedated, but he grimaces and moves his arms and his legs symmetrically with provocation.   Data Reviewed: Basic Metabolic Panel:  Recent Labs Lab 11/13/13 0518 11/14/13 0459 11/15/13 0402 11/16/13 0400 11/16/13 0721 11/17/13 0423  NA 145 144 145 139  --  140  K 3.5* 3.7 3.0* 3.1*  --  3.3*  CL 113* 109 107 102  --  97  CO2 23 22 29 29   --  31  GLUCOSE 86 95 195* 147*  --  123*  BUN 14 11 10 12   --  14  CREATININE 1.13 1.00 1.02 0.97  --  1.02  CALCIUM 7.9* 7.9* 8.0* 7.6*  --  8.0*  MG  --  1.7 1.6  --  1.5 1.6  PHOS  --   --  2.4  --   --  3.1   Liver Function Tests:  Recent Labs Lab 11/13/13 0518 11/15/13 0402 11/16/13 0400 11/17/13 0423  AST 13 16 19  37  ALT 8 10 11 22   ALKPHOS 44 50 38* 53  BILITOT 0.4 0.3 0.3 0.6  PROT 4.8* 4.9* 4.5* 5.5*  ALBUMIN 1.9* 1.9* 1.7* 2.2*   No results found for this basename: LIPASE, AMYLASE,  in the last 168 hours No results found for this basename: AMMONIA,  in the last 168 hours CBC:  Recent Labs Lab 11/12/13 0513 11/13/13 0518 11/13/13 1450 11/14/13 0459 11/15/13 0402 11/16/13 0400 11/16/13 1610  WBC 8.1 6.3  --  5.2 5.1  8.0  --   NEUTROABS  --   --   --   --  4.0  --   --   HGB 9.1* 8.4* 9.0* 8.6* 8.3* 7.7* 8.7*  HCT 28.5* 25.9* 27.8* 26.8* 25.7* 23.8* 26.7*  MCV 84.1 82.5  --  83.2 81.6 81.5  --   PLT 97* 102*  --  112* 130* 131*  --    Cardiac Enzymes: No results found for this basename: CKTOTAL, CKMB, CKMBINDEX, TROPONINI,  in the last 168 hours BNP (last 3 results)  Recent Labs  11/09/13 2247 11/12/13 0500  PROBNP 1241.0* 529.2*   CBG:  Recent Labs Lab 11/16/13 2043 11/16/13 2216 11/17/13 0002 11/17/13 0435 11/17/13 0734  GLUCAP 88 79 104* 115* 142*    Recent Results (from the past 240 hour(s))  MRSA PCR SCREENING     Status: None   Collection Time    11/09/13  6:15 PM      Result Value Ref Range Status   MRSA by PCR NEGATIVE  NEGATIVE Final   Comment:  The GeneXpert MRSA Assay (FDA     approved for NASAL specimens     only), is one component of a     comprehensive MRSA colonization     surveillance program. It is not     intended to diagnose MRSA     infection nor to guide or     monitor treatment for     MRSA infections.  CULTURE, BLOOD (ROUTINE X 2)     Status: None   Collection Time    11/09/13 10:47 PM      Result Value Ref Range Status   Specimen Description BLOOD LEFT WRIST   Final   Special Requests     Final   Value: BOTTLES DRAWN AEROBIC AND ANAEROBIC AEB=6CC ANA=4CC   Culture NO GROWTH 5 DAYS   Final   Report Status 11/14/2013 FINAL   Final  CULTURE, BLOOD (ROUTINE X 2)     Status: None   Collection Time    11/09/13 10:47 PM      Result Value Ref Range Status   Specimen Description BLOOD CENTRAL LINE DRAWN BY RN   Final   Special Requests BOTTLES DRAWN AEROBIC AND ANAEROBIC [place]   Final   Culture NO GROWTH 5 DAYS   Final   Report Status 11/14/2013 FINAL   Final  CULTURE, EXPECTORATED SPUTUM-ASSESSMENT     Status: None   Collection Time    11/15/13  8:38 PM      Result Value Ref Range Status   Specimen Description SPUTUM   Final   Special  Requests NONE   Final   Sputum evaluation     Final   Value: THIS SPECIMEN IS ACCEPTABLE. RESPIRATORY CULTURE REPORT TO FOLLOW.     PERFORMED AT [facility]   Report Status 11/15/2013 FINAL   Final  CULTURE, RESPIRATORY (NON-EXPECTORATED)     Status: None   Collection Time    11/15/13  8:38 PM      Result Value Ref Range Status   Specimen Description SPUTUM   Final   Special Requests NONE   Final   Gram Stain     Final   Value: ABUNDANT WBC PRESENT,BOTH PMN AND MONONUCLEAR     RARE SQUAMOUS EPITHELIAL CELLS PRESENT     RARE GRAM NEGATIVE RODS     Performed at [facility]   Culture PENDING   Incomplete   Report Status PENDING   Incomplete     Studies: Ct Abdomen Pelvis Wo Contrast  11/15/2013   CLINICAL DATA:  Small-bowel obstruction.  EXAM: CT ABDOMEN AND PELVIS WITHOUT CONTRAST  TECHNIQUE: Multidetector CT imaging of the abdomen and pelvis was performed following the standard protocol without IV contrast.  COMPARISON:  10/21/2013.  FINDINGS: Tiny cysts noted in the liver. Liver otherwise unremarkable. Spleen unremarkable. Pancreas unremarkable. No biliary distention. Gallbladder is nondistended.  Adrenals unremarkable. No focal significant renal abnormality. Sandlike stone right renal collecting system. No evidence of obstructing ureteral stone. Foley catheter is in the bladder. Prostate is unremarkable .  No significant adenopathy.  No aneurysm.  Right inguinal hernia is present with dilated loops of small bowel and stomach. Prominent hiatal hernia. Prominent thickening of the rectosigmoid wall is noted. This could be from colitis. This is a new finding. Pseudomembranous colitis is not polyp is a concern. Other etiologies of bowel wall edema cannot be excluded. No free air is identified.  No other significant abdominal wall hernias are noted. Diffuse anasarca. Coronary artery disease. Bibasilar atelectasis small  pleural effusions. Degenerative changes noted of the lumbar spine and both hips.   IMPRESSION: 1. Persistent right inguinal hernia with associated small bowel distention and gastric distention. Suggest the presence of small-bowel obstruction. 2. Severe new onset of thickening of the rectosigmoid colonic wall. This suggests colitis possibly pseudomembranous colitis. 3. Best bibasilar atelectasis and/or pneumonia with bilateral pleural effusions. 4. Anasarca.   Electronically Signed   By: Marcello Moores  Register   On: 11/15/2013 17:07   Ct Head Wo Contrast  11/15/2013   CLINICAL DATA:  Ventilator dependence with mental status changes.  EXAM: CT HEAD WITHOUT CONTRAST  TECHNIQUE: Contiguous axial images were obtained from the base of the skull through the vertex without intravenous contrast.  COMPARISON:  08/01/2013  FINDINGS: There is no evidence for acute hemorrhage, hydrocephalus, mass lesion, or abnormal extra-axial fluid collection. No definite CT evidence for acute infarction. Diffuse loss of parenchymal volume is consistent with atrophy. The visualized paranasal sinuses and mastoid air cells are clear.  IMPRESSION: Stable. No acute intracranial abnormality. Specifically, no evidence for acute ischemia on the current exam.   Electronically Signed   By: Misty Stanley M.D.   On: 11/15/2013 16:55   Dg Chest Port 1 View  11/16/2013   CLINICAL DATA:  Respiratory failure l  EXAM: PORTABLE CHEST - 1 VIEW  COMPARISON:  Portable chest x-ray of 11/15/2013  FINDINGS: There has been worsening of diffuse airspace disease bilaterally most consistent with edema. Pneumonia cannot be excluded. No effusion is seen. The endotracheal tube tip is difficult to visualize, approximately 3.2 cm above the carina. Right central venous line tip overlies the lower SVC. The heart is unchanged in size.  IMPRESSION: 1. Worsening of diffuse airspace disease most consistent with edema. 2. No change in position of endotracheal tube and right central venous line.   Electronically Signed   By: Ivar Drape M.D.   On: 11/16/2013  08:21    Scheduled Meds: . antiseptic oral rinse  15 mL Mouth Rinse QID  . chlorhexidine  15 mL Mouth Rinse BID  . enoxaparin (LOVENOX) injection  40 mg Subcutaneous Q24H  . famotidine (PEPCID) IV  20 mg Intravenous Q12H  . furosemide  20 mg Intravenous Daily  . insulin aspart  0-15 Units Subcutaneous 6 times per day  . ipratropium-albuterol  3 mL Nebulization Q4H  . metronidazole  500 mg Intravenous Q8H  . potassium chloride  10 mEq Intravenous Q1 Hr x 6  . sodium chloride  10-40 mL Intracatheter Q12H  . vancomycin  750 mg Intravenous Q12H   Continuous Infusions: . Marland KitchenTPN (CLINIMIX-E) Adult 60 mL/hr at 11/17/13 0700   And  . fat emulsion 250 mL (11/17/13 0700)  . fentaNYL infusion INTRAVENOUS 200 mcg/hr (11/17/13 0700)  . phenylephrine (NEO-SYNEPHRINE) Adult infusion 2.5 mcg/min (11/17/13 0700)    Principal Problem:   Septic shock Active Problems:   Acute respiratory failure with hypoxia   Small bowel obstruction   Pseudomembranous colitis   SBO (small bowel obstruction)   HTN (hypertension), benign   Acute encephalopathy   Aspiration pneumonia   Protein-calorie malnutrition, severe   Thrombocytopenia, unspecified   Hypoglycemia due to insulin    Time spent: critical care including discussion with family: 25mins    Rexene Alberts M.D. [facility] Pager [phone]. If 7PM-7AM, please contact night-coverage at [email or web address], password [facility] 11/17/2013, 8:47 AM  LOS: 9 days

## 2013-11-17 NOTE — Progress Notes (Signed)
Respiratory Therapy received orders to terminal wean patient from ventilator. RT extubated patient to 40% venturi mask, RN at bedside. Patient SATs post extubation on venturi mask 95%, HR 125 and RR 29. BBS coarse wheezes, RT gave breathing treatment with duoneb and placed back on venturi mask 40%.

## 2013-11-17 NOTE — Progress Notes (Signed)
Called to assess CVL, non-patent. Instilled 2 mg Cathflo activase with ease. Removed after thirty minutes with easy-flowing blood return noted. Flushed well with 10 cc's NS and RN notified.

## 2013-11-17 NOTE — Progress Notes (Signed)
Repeat blood sugar 88 after 90mls of D50 given iv; will continue to monitor and document any changes

## 2013-11-17 NOTE — Progress Notes (Signed)
Blood sugar of 44 at 2000, 62mls of d50 given per hypoglycemia protocol; TPN and lipids stopped, TPN currently contains 15 units of insulin; Dr Laverle Patter notified of change and updated on the current plan of recheck the blood sugar and continue to monitor for hypoglycemia

## 2013-11-17 NOTE — Progress Notes (Signed)
DO NOT RESUSCITATE status noted. Will follow peripherally as needed.

## 2013-11-17 NOTE — Progress Notes (Signed)
He remains intubated on the ventilator. He required some pressor support through the night. He is essentially unchanged. He is approaching the need for tracheostomy. Because of the approaching need for tracheostomy and active we don't seem to be making any progress with his multiple medical problems family conference was initiated with Dr. Caryn Section and myself and multiple family members including his wife and brother. I told him that I thought the best course of action at this point would be to extubate him without intention of re intubating him and him if he has respiratory distress week and start him on morphine continuous infusion

## 2013-11-17 NOTE — Progress Notes (Signed)
NUTRITION FOLLOW UP (Change in Status)  Intervention:   Follow for progression of healthcare decisions  Nutrition Dx:   Inadequate oral intake; ongoing.  Goal:   Pt to meet >/= 90% of their estimated nutrition needs    Monitor:   Respiratory status, nutrition support measures, wt changes, labs  Assessment:  Terminal wean today. Re-estimated current needs. TPN continues at this time and has been changed to Clinimix 5/20 @ 60 ml /hr with lipids, MVI and trace elements daily. Nutrition support providing: 1747 kcal, 72 gr protein, 1680 ml q 24 hr including lipids.   Height: Ht Readings from Last 1 Encounters:  11/16/13 5\' 1"  (1.549 m)    Weight Status:   Wt Readings from Last 1 Encounters:  11/17/13 142 lb 13.7 oz (64.8 kg)  Admit Wt.-135# on 10/31/2013.  Re-estimated needs:  Kcal: 1625-1950 Protein: 97 gr Fluid: 1.6-2.0 liters daily (normal needs)  Skin: no new issues  Diet Order: NPO   Intake/Output Summary (Last 24 hours) at 11/17/13 1613 Last data filed at 11/17/13 1011  Gross per 24 hour  Intake 3789.2 ml  Output   5650 ml  Net -1860.8 ml    Last BM: 11/12/13   Labs:   Recent Labs Lab 11/15/13 0402 11/16/13 0400 11/16/13 0721 11/17/13 0423  NA 145 139  --  140  K 3.0* 3.1*  --  3.3*  CL 107 102  --  97  CO2 29 29  --  31  BUN 10 12  --  14  CREATININE 1.02 0.97  --  1.02  CALCIUM 8.0* 7.6*  --  8.0*  MG 1.6  --  1.5 1.6  PHOS 2.4  --   --  3.1  GLUCOSE 195* 147*  --  123*    CBG (last 3)   Recent Labs  11/17/13 0435 11/17/13 0734 11/17/13 1152  GLUCAP 115* 142* 149*    Scheduled Meds: . antiseptic oral rinse  15 mL Mouth Rinse QID  . artificial tears   Both Eyes Q6H  . chlorhexidine  15 mL Mouth Rinse BID  . enoxaparin (LOVENOX) injection  40 mg Subcutaneous Q24H  . famotidine (PEPCID) IV  20 mg Intravenous Q12H  . furosemide  20 mg Intravenous Daily  . insulin aspart  0-15 Units Subcutaneous 6 times per day  . ipratropium-albuterol   3 mL Nebulization Q4H  . metronidazole  500 mg Intravenous Q8H  . sodium chloride  10-40 mL Intracatheter Q12H  . vancomycin  750 mg Intravenous Q12H    Continuous Infusions: . Marland KitchenTPN (CLINIMIX-E) Adult 60 mL/hr at 11/17/13 0700   And  . fat emulsion 250 mL (11/17/13 0700)  . Marland KitchenTPN (CLINIMIX-E) Adult     And  . fat emulsion    . fentaNYL infusion INTRAVENOUS 200 mcg/hr (11/17/13 0700)  . phenylephrine (NEO-SYNEPHRINE) Adult infusion 2.5 mcg/min (11/17/13 0700)    Colman Cater MS,RD,CSG,LDN Office: [phone] Pager: [phone]

## 2013-11-17 NOTE — Progress Notes (Signed)
[place] NOTE  Pharmacy Consult for TPN Indication: SBO, malnutrition, hypoalbuminemia  Allergies  Allergen Reactions  . Solu-Medrol [Methylprednisolone Acetate] Other (See Comments)    Severe encephalopathy/delirium.   Patient Measurements: Height: 5\' 1"  (154.9 cm) Weight: 142 lb 13.7 oz (64.8 kg) IBW/kg (Calculated) : 52.3  Vital Signs: Temp: 98.2 F (36.8 C) (05/28 1130) Temp src: Axillary (05/28 1130) BP: 116/63 mmHg (05/28 1230) Pulse Rate: 118 (05/28 1245) Intake/Output from previous day: 05/27 0701 - 05/28 0700 In: 4567.9 [I.V.:1434.9; Blood:700; IV Piggyback:1200; QIH:4742] Out: 5956 [LOVFI:4332; Emesis/NG output:350] Intake/Output from this shift: Total I/O In: 550 [IV Piggyback:550] Out: -   Labs:  Recent Labs  11/15/13 0402 11/16/13 0400 11/16/13 1610 11/17/13 0837  WBC 5.1 8.0  --  9.0  HGB 8.3* 7.7* 8.7* 11.0*  HCT 25.7* 23.8* 26.7* 33.4*  PLT 130* 131*  --  186    Recent Labs  11/15/13 0402 11/16/13 0400 11/16/13 0721 11/17/13 0423  NA 145 139  --  140  K 3.0* 3.1*  --  3.3*  CL 107 102  --  97  CO2 29 29  --  31  GLUCOSE 195* 147*  --  123*  BUN 10 12  --  14  CREATININE 1.02 0.97  --  1.02  CALCIUM 8.0* 7.6*  --  8.0*  MG 1.6  --  1.5 1.6  PHOS 2.4  --   --  3.1  PROT 4.9* 4.5*  --  5.5*  ALBUMIN 1.9* 1.7*  --  2.2*  AST 16 19  --  37  ALT 10 11  --  22  ALKPHOS 50 38*  --  53  BILITOT 0.3 0.3  --  0.6  PREALBUMIN 5.0*  --   --   --   TRIG 95  --   --   --    Estimated Creatinine Clearance: 47.6 ml/min (by C-G formula based on Cr of 1.02).    Recent Labs  11/17/13 0435 11/17/13 0734 11/17/13 1152  GLUCAP 115* 142* 149*   Medical History: Past Medical History  Diagnosis Date  . Hypertension   . Hypercholesterolemia   . Cancer     prostate   Medications:  Scheduled:  . antiseptic oral rinse  15 mL Mouth Rinse QID  . chlorhexidine  15 mL Mouth Rinse BID  . enoxaparin (LOVENOX) injection  40 mg  Subcutaneous Q24H  . famotidine (PEPCID) IV  20 mg Intravenous Q12H  . furosemide  20 mg Intravenous Daily  . insulin aspart  0-15 Units Subcutaneous 6 times per day  . ipratropium-albuterol  3 mL Nebulization Q4H  . metronidazole  500 mg Intravenous Q8H  . sodium chloride  10-40 mL Intracatheter Q12H  . vancomycin  750 mg Intravenous Q12H   Insulin Requirements in the past 24 hours:  7 units  Current Nutrition:  titrating TPN  Assessment: 80yo M on ventilator with septic shock most likely related to aspiration pneumonia possibly secondary to SBO.   Severe malnutrition, hypoalbuminemia noted.  Prealbumin = 5 Renal function is stable.  I/O = - 2352 Potassium is low today & will be replenished.  No hx of DM, but blood glucose became elevated which required SSI.  A small amount of insulin was added to TPN and pt had 2 episodes of hypoglycemia.  TPN was held for a short time then resumed.  Sugars have been fine since.  Dietician note reviewed and appreciated.  Pt is unresponsive and has been  extubated.  Pt is now a DNR but asked to continue TPN until evaluation can be made regarding progress off vent.      Nutritional Goals:  (based on wt of 64Kg in critically ill pt)  Kcal: 1550-1700 daily  Protein: 90-105 grams daily  Fluid: 1.5-1.7 L daily Goal rate for TPN = 83-90 ml/hrs based on tolerance and progress  Plan:   Change to Clinimix E 5/20 at 15ml/hr today    Reduce regular insulin to 10 units in TPN bag today  Add MVI, Trace Elements to TPN daily  Lipids daily with TPN  Continue SSI with CBGs q4h     KCl 78mEq IV x6 runs today  Monitor lytes, renal fxn, CBGs, Prealbumin and fluid status  D/C TPN if pt converts to comfort care only.    Eagle Pitta A Ender Rorke 11/17/2013,3:14 PM

## 2013-11-17 NOTE — Clinical Social Work Note (Signed)
CSW noted consult, reviewed chart and spoke w MD.  At this time, spiritual care has met w family to offer support.  CSW will defer to spiritual care.  CSW signing off but can be reconsulted if needed.  Edwyna Shell, LCSW Clinical Social Worker [phone])

## 2013-11-18 LAB — CULTURE, RESPIRATORY

## 2013-11-18 LAB — TYPE AND SCREEN
ABO/RH(D): O POS
Antibody Screen: NEGATIVE
Unit division: 0
Unit division: 0

## 2013-11-18 LAB — CBC
HCT: 41.4 % (ref 39.0–52.0)
Hemoglobin: 12 g/dL — ABNORMAL LOW (ref 13.0–17.0)
MCH: 26.6 pg (ref 26.0–34.0)
MCHC: 29 g/dL — AB (ref 30.0–36.0)
MCV: 91.8 fL (ref 78.0–100.0)
Platelets: 236 10*3/uL (ref 150–400)
RBC: 4.51 MIL/uL (ref 4.22–5.81)
RDW: 14.7 % (ref 11.5–15.5)
WBC: 11.8 10*3/uL — ABNORMAL HIGH (ref 4.0–10.5)

## 2013-11-18 LAB — BASIC METABOLIC PANEL
BUN: 22 mg/dL (ref 6–23)
BUN: 30 mg/dL — ABNORMAL HIGH (ref 6–23)
CALCIUM: 8.2 mg/dL — AB (ref 8.4–10.5)
CALCIUM: 8.2 mg/dL — AB (ref 8.4–10.5)
CHLORIDE: 100 meq/L (ref 96–112)
CO2: 34 mEq/L — ABNORMAL HIGH (ref 19–32)
CO2: 31 meq/L (ref 19–32)
CREATININE: 1.1 mg/dL (ref 0.50–1.35)
Chloride: 103 mEq/L (ref 96–112)
Creatinine, Ser: 1.25 mg/dL (ref 0.50–1.35)
GFR calc Af Amer: 72 mL/min — ABNORMAL LOW (ref >90)
GFR calc Af Amer: 61 mL/min — ABNORMAL LOW (ref >90)
GFR calc non Af Amer: 53 mL/min — ABNORMAL LOW (ref >90)
GFR, EST NON AFRICAN AMERICAN: 62 mL/min — AB (ref >90)
Glucose, Bld: 171 mg/dL — ABNORMAL HIGH (ref 70–99)
Glucose, Bld: 131 mg/dL — ABNORMAL HIGH (ref 70–99)
Potassium: 6.1 mEq/L — ABNORMAL HIGH (ref 3.7–5.3)
Potassium: 5 mEq/L (ref 3.7–5.3)
SODIUM: 142 meq/L (ref 137–147)
Sodium: 143 mEq/L (ref 137–147)

## 2013-11-18 LAB — GLUCOSE, CAPILLARY
GLUCOSE-CAPILLARY: 159 mg/dL — AB (ref 70–99)
Glucose-Capillary: 131 mg/dL — ABNORMAL HIGH (ref 70–99)
Glucose-Capillary: 107 mg/dL — ABNORMAL HIGH (ref 70–99)
Glucose-Capillary: 102 mg/dL — ABNORMAL HIGH (ref 70–99)

## 2013-11-18 LAB — CULTURE, RESPIRATORY W GRAM STAIN

## 2013-11-18 LAB — POTASSIUM: POTASSIUM: 6 meq/L — AB (ref 3.7–5.3)

## 2013-11-18 MED ORDER — SODIUM BICARBONATE 8.4 % IV SOLN
50.0000 meq | Freq: Once | INTRAVENOUS | Status: AC
  Administered 2013-11-18: 50 meq via INTRAVENOUS
  Filled 2013-11-18: qty 50

## 2013-11-18 MED ORDER — FAT EMULSION 20 % IV EMUL
250.0000 mL | INTRAVENOUS | Status: DC
  Administered 2013-11-18: 250 mL via INTRAVENOUS
  Filled 2013-11-18 (×3): qty 250

## 2013-11-18 MED ORDER — TRACE MINERALS CR-CU-F-FE-I-MN-MO-SE-ZN IV SOLN
INTRAVENOUS | Status: DC
  Administered 2013-11-18: 18:00:00 via INTRAVENOUS
  Filled 2013-11-18: qty 2000

## 2013-11-18 MED ORDER — LEVOFLOXACIN IN D5W 500 MG/100ML IV SOLN
500.0000 mg | INTRAVENOUS | Status: DC
  Administered 2013-11-18: 500 mg via INTRAVENOUS
  Filled 2013-11-18 (×4): qty 100

## 2013-11-18 MED ORDER — MORPHINE SULFATE 10 MG/ML IJ SOLN
2.0000 mg/h | INTRAVENOUS | Status: DC
  Filled 2013-11-18: qty 10

## 2013-11-18 MED ORDER — ALBUTEROL SULFATE (2.5 MG/3ML) 0.083% IN NEBU
2.5000 mg | INHALATION_SOLUTION | Freq: Once | RESPIRATORY_TRACT | Status: AC
  Administered 2013-11-18: 2.5 mg via RESPIRATORY_TRACT
  Filled 2013-11-18: qty 3

## 2013-11-18 MED ORDER — MORPHINE SULFATE 10 MG/ML IJ SOLN
2.0000 mg/h | INTRAVENOUS | Status: DC
  Administered 2013-11-18: 2 mg/h via INTRAVENOUS
  Filled 2013-11-18: qty 10

## 2013-11-21 NOTE — Progress Notes (Signed)
Patient found unresponsive and pulseless.  Verified expiration with another nurse.  Family present and notified of situation.  md notified of patient's expiration.

## 2013-11-21 NOTE — Progress Notes (Signed)
Wasted 210 ml of Fentanly gtt with L.Schonewits,RN.

## 2013-11-21 NOTE — Progress Notes (Addendum)
[facility] PROGRESS NOTE  Douglas Mckay DGL:[id number] DOB: [birth year] DOA: 11/17/2013 PCP: Marjo Bicker, MD  Summary:  This is a 80 year old gentleman who was admitted to the hospital with abdominal pain, distention, nausea and vomiting and found to have a small bowel obstruction with a right inguinal hernia. He was initially admitted to to a medical bed under the surgical service, NG tube was placed for decompression. Shortly after admission, patient became increasingly tachypneic, hypoxic and less responsive. He was placed on BiPAP therapy, but was not appearing to tolerate this. Ultimately, he required transfer to the ICU, with intubation for airway management and severe hypoxia. Hospitalist service assumed care as his primary attending. Chest x-ray indicated aspiration pneumonia. He was started on empiric antibiotics. He subsequently went into septic shock requiring IV fluids and vasopressors. His blood pressure stabilized and he was weaned off of vasopressors. He is continued on antibiotics. Weaning from the ventilator has been difficult due to thick secretions and agitation during the weaning process. He is undergoing daily weaning trials. Surgery continues to follow for small bowel obstruction, but this will have to be further addressed once he is stabilized from a pulmonary standpoint.   In the interim, the patient became hypotensive again and developed a low-grade fever. Followup CT of his abdomen/pelvis on 5/26 revealed severe new onset of thickening of the rectosigmoid colon suggesting pseudomembranous colitis. Due to this finding, Zosyn was discontinued in favor of metronidazole which would also cover aspiration pneumonia. He continues on vancomycin. He was restarted on Neo-Synephrine  and given 2 units of packed red blood cells for blood pressure support on 5/27. His hemoglobin fell to 7.7 without any obvious source of bleeding. The decrease in hemoglobin was felt to be secondary  to severe illness. Because of his poor prognosis and clinical decline, Dr. Luan Pulling and I had a conversation with the patient's family regarding his poor prognosis on 5/28.. Following the conversation, it was decided that the patient would be extubated on 5/28 without reintubation. We will continue to provide supportive treatment and treatment of his acute illnesses, but if it looks as if he is not improving without ventilator support, then he will be transitioned to comfort care.    Assessment/Plan: 1. Septic shock. Likely associated with aspiration pneumonia and now new-onset severe rectosigmoid colitis, possibly pseudomembranous colitis. Patient was aggressively hydrated with IV fluids and initially required vasopressor support.  He was gradually able to wean off pressors, but Neo-Synephrine had to be restarted on 5/27.  He had been on Zosyn and vancomycin for 7 days, but given the new finding of pseudomembranous colitis, Zosyn was discontinued and metronidazole was added which will give anaerobic coverage for aspiration pneumonia as well. Vancomycin is being continued. Blood cultures have shown no growth to date.  2. New severe pseudomembranous colitis, per followup CT scan of the abdomen and pelvis on 5/26. His fever has resolved follow the addition of metronidazole. C. difficile PCR pending.  3. Aspiration pneumonia. Likely related to bowel obstruction. As above, Zosyn was discontinued. Will continue vancomycin and metronidazole.  Chest xray on 5/27 revealed  shows worsening diffuse airspace disease, most consistent with edema. We'll continue gentle Lasix at 20 mg IV daily.   EF was normal range on echo.   Sputum culture reveals abundant stenotrophomonas sensitive to Levaquin and Bactrim. Will add IV Levaquin. 4. Acute respiratory failure, likely due to pneumonia. Patient required intubation on 5/20 when he failed bipap treatment. The plan is to extubate the patient today with  no plan to reintubate  given his poor prognosis and the probable need for tracheostomy if mechanical ventilation would be desired by his wife, which is not. 5. Small bowel obstruction with right inguinal hernia. Followup CT scan of the abdomen and pelvis on 5/26 revealed persistent right inguinal hernia with associated small bowel distention and gastric distention. Surgery is following.  Continuing NG tube decompression.  6. Severe protein calorie malnutrition and hypoalbuminemia. Related to small bowel obstruction and prolonged NPO.  Started on TPN on 5/25 7. Acute encephalopathy. CT scan of the head without contrast on 5/26 revealed no acute intracranial abnormality; specifically no evidence of acute ischemia. His ongoing encephalopathy is likely secondary to acute illness, medications, ICU setting. 8. Hyperkalemia with recent history of hypokalemia. Replacement/repletion for hypokalemia had been discussed with pharmacy. He was given potassium in IV fluids and potassium chloride runs previously. He was given 1 g of magnesium sulfate for borderline hypomagnesemia. Now that he is hyperkalemic, pharmacy will adjust the TPN. Will treat the elevation with 1 amp of bicarbonate, IV Lasix, and albuterol nebulizer.  9. Anemia. The patient's hemoglobin continues to drift downward despite no obvious evidence of melena/hematochezia/hematemesis. This is likely secondary to venipunctures and possibly volume overload from resuscitation and related to critical illness. On 5/27 he was transfused 2 units of packed red blood cells when his hemoglobin fell to 7.7 and he became more hypotensive. Again there were no obvious evidence of gross bleeding. Following the transfusions, his hemoglobin is at 11.0 today.   10. Thrombocytopenia. His platelet count has normalized. The thrombocytopenia was  likely associated with infection/sepsis. His vitamin B12 and TSH were within normal limits in February 2015.  11. Anasarca. Likely secondary to volume  resuscitation. 2-D echocardiogram revealed a normal left ventricular ejection fraction. continue IV Lasix at 20 mg IV every 24 hours and then adjust as needed.  12. Hypoglycemia associated with insulin in the TPN. Treated appropriately by the nursing staff. Will ask pharmacy to adjust insulin. 13. Poor prognosis. Dr. Luan Pulling and I discussed the patient's poor prognosis with his wife and other family members present on 5/28. They were informed that he has not improved clinically despite vigorous treatment. We went on to say that if he were to remain intubated, he would require tracheostomy. His wife did not want this. She was in agreement to extubate him without any plans to reintubate him. She was in agreement with comfort care if it appeared that he would not survive the extubation. In the meantime, she was informed that all other treatments would continue unless it appeared that his death was imminent and then he would be transitioned to complete comfort care. He is a DO NOT RESUSCITATE status. The Fetanyl drip was inadvertently continued yesterday. It will be discontinued today. However, morphine every 15 minutes when necessary has been ordered.   Code Status: DO NOT RESUSCITATE  Family Communication: discussed with wife. Disposition Plan: home vs. Snf, pending hospital course   Consultants:  Gen Surgery  Pulmonology  Procedures:  Intubation 5/20>>5/28  Central line placement 5/20>>  Echo: - Left ventricle: The cavity size was normal. Wall thickness was normal. Systolic function was vigorous. The estimated ejection fraction was in the range of 65% to 70%. Wall motion was normal; there were no regional wall motion abnormalities. - Right ventricle: The cavity size was mildly dilated. - Impressions: Technically limited study due to poor sound wave transmission.    Antibiotics:  Levaquin 5/29>>  Metronidazole 5/27>>  Vancomycin 5/20>>  Zosyn 5/20>>  5/27  HPI/Subjective: 79 year old African-American man sedated on the fentanyl drip. He does not respond to stimuli. He is status post extubation on 5/28.   Objective: Filed Vitals:   2013/11/22 0800  BP: 109/52  Pulse: 91  Temp: 97.9 F (36.6 C)  Resp: 14   oxygen saturation 99% on supplemental oxygen.   Intake/Output Summary (Last 24 hours) at 11-22-2013 0918 Last data filed at 22-Nov-2013 0600  Gross per 24 hour  Intake   2300 ml  Output   1125 ml  Net   1175 ml   Filed Weights   11/16/13 2000 11/17/13 0500 11/22/2013 0500  Weight: 60 kg (132 lb 4.4 oz) 64.8 kg (142 lb 13.7 oz) 64.3 kg (141 lb 12.1 oz)    Exam:   General:  80 year old African-American man sedated and nearly obtunded.  Cardiovascular: S1, S2 with no murmurs rubs or gallops.  Respiratory: rhonchi bilaterally  Abdomen: Rare bowel sounds, mildly to moderately distended and tympanic.  Musculoskeletal:  Trace pedal edema bilaterally.  Neurologic: He is sedated/obtunded.   Data Reviewed: Basic Metabolic Panel:  Recent Labs Lab 11/14/13 0459 11/15/13 0402 11/16/13 0400 11/16/13 0721 11/17/13 0423 Nov 22, 2013 0436 11-22-2013 0745  NA 144 145 139  --  140 143  --   K 3.7 3.0* 3.1*  --  3.3* 6.1* 6.0*  CL 109 107 102  --  97 103  --   CO2 22 29 29   --  31 34*  --   GLUCOSE 95 195* 147*  --  123* 171*  --   BUN 11 10 12   --  14 22  --   CREATININE 1.00 1.02 0.97  --  1.02 1.10  --   CALCIUM 7.9* 8.0* 7.6*  --  8.0* 8.2*  --   MG 1.7 1.6  --  1.5 1.6  --   --   PHOS  --  2.4  --   --  3.1  --   --    Liver Function Tests:  Recent Labs Lab 11/13/13 0518 11/15/13 0402 11/16/13 0400 11/17/13 0423  AST 13 16 19  37  ALT 8 10 11 22   ALKPHOS 44 50 38* 53  BILITOT 0.4 0.3 0.3 0.6  PROT 4.8* 4.9* 4.5* 5.5*  ALBUMIN 1.9* 1.9* 1.7* 2.2*   No results found for this basename: LIPASE, AMYLASE,  in the last 168 hours No results found for this basename: AMMONIA,  in the last 168 hours CBC:  Recent  Labs Lab 11/14/13 0459 11/15/13 0402 11/16/13 0400 11/16/13 1610 11/17/13 0837 11/22/2013 0436  WBC 5.2 5.1 8.0  --  9.0 11.8*  NEUTROABS  --  4.0  --   --   --   --   HGB 8.6* 8.3* 7.7* 8.7* 11.0* 12.0*  HCT 26.8* 25.7* 23.8* 26.7* 33.4* 41.4  MCV 83.2 81.6 81.5  --  81.9 91.8  PLT 112* 130* 131*  --  186 236   Cardiac Enzymes: No results found for this basename: CKTOTAL, CKMB, CKMBINDEX, TROPONINI,  in the last 168 hours BNP (last 3 results)  Recent Labs  11/09/13 2247 11/12/13 0500  PROBNP 1241.0* 529.2*   CBG:  Recent Labs Lab 11/17/13 1634 11/17/13 2010 11/17/13 2332 2013/11/22 0432 2013-11-22 0726  GLUCAP 100* 167* 171* 131* 107*    Recent Results (from the past 240 hour(s))  MRSA PCR SCREENING     Status: None   Collection Time    11/09/13  6:15 PM  Result Value Ref Range Status   MRSA by PCR NEGATIVE  NEGATIVE Final   Comment:            The GeneXpert MRSA Assay (FDA     approved for NASAL specimens     only), is one component of a     comprehensive MRSA colonization     surveillance program. It is not     intended to diagnose MRSA     infection nor to guide or     monitor treatment for     MRSA infections.  CULTURE, BLOOD (ROUTINE X 2)     Status: None   Collection Time    11/09/13 10:47 PM      Result Value Ref Range Status   Specimen Description BLOOD LEFT WRIST   Final   Special Requests     Final   Value: BOTTLES DRAWN AEROBIC AND ANAEROBIC AEB=6CC ANA=4CC   Culture NO GROWTH 5 DAYS   Final   Report Status 11/14/2013 FINAL   Final  CULTURE, BLOOD (ROUTINE X 2)     Status: None   Collection Time    11/09/13 10:47 PM      Result Value Ref Range Status   Specimen Description BLOOD CENTRAL LINE DRAWN BY RN   Final   Special Requests BOTTLES DRAWN AEROBIC AND ANAEROBIC [place]   Final   Culture NO GROWTH 5 DAYS   Final   Report Status 11/14/2013 FINAL   Final  CULTURE, EXPECTORATED SPUTUM-ASSESSMENT     Status: None   Collection Time     11/15/13  8:38 PM      Result Value Ref Range Status   Specimen Description SPUTUM   Final   Special Requests NONE   Final   Sputum evaluation     Final   Value: THIS SPECIMEN IS ACCEPTABLE. RESPIRATORY CULTURE REPORT TO FOLLOW.     PERFORMED AT [facility]   Report Status 11/15/2013 FINAL   Final  CULTURE, RESPIRATORY (NON-EXPECTORATED)     Status: None   Collection Time    11/15/13  8:38 PM      Result Value Ref Range Status   Specimen Description TRACHEAL ASPIRATE   Final   Special Requests NONE   Final   Gram Stain     Final   Value: ABUNDANT WBC PRESENT,BOTH PMN AND MONONUCLEAR     RARE SQUAMOUS EPITHELIAL CELLS PRESENT     RARE GRAM NEGATIVE RODS     Performed at [facility]   Culture     Final   Value: ABUNDANT STENOTROPHOMONAS MALTOPHILIA     Performed at [facility]   Report Status 11/19/2013 FINAL   Final   Organism ID, Bacteria STENOTROPHOMONAS MALTOPHILIA   Final     Studies: No results found.  Scheduled Meds: . antiseptic oral rinse  15 mL Mouth Rinse QID  . artificial tears   Both Eyes Q6H  . chlorhexidine  15 mL Mouth Rinse BID  . enoxaparin (LOVENOX) injection  40 mg Subcutaneous Q24H  . famotidine (PEPCID) IV  20 mg Intravenous Q12H  . furosemide  20 mg Intravenous Daily  . insulin aspart  0-15 Units Subcutaneous 6 times per day  . metronidazole  500 mg Intravenous Q8H  . sodium chloride  10-40 mL Intracatheter Q12H  . vancomycin  750 mg Intravenous Q12H   Continuous Infusions: . Marland KitchenTPN (CLINIMIX-E) Adult 60 mL/hr at 10/21/2013 0700   And  . fat emulsion 250 mL (  2013/12/10 0700)    Principal Problem:   Septic shock Active Problems:   Acute respiratory failure with hypoxia   Small bowel obstruction   Pseudomembranous colitis   SBO (small bowel obstruction)   HTN (hypertension), benign   Acute encephalopathy   Aspiration pneumonia   Protein-calorie malnutrition, severe   Thrombocytopenia, unspecified   Hypoglycemia due to  insulin    Time spent: 35 minutes.    Rexene Alberts M.D. [facility] Pager [phone]. If 7PM-7AM, please contact night-coverage at [email or web address], password [facility] 2013/12/10, 9:18 AM  LOS: 10 days

## 2013-11-21 NOTE — Progress Notes (Signed)
Subjective: He was able to be extubated yesterday and has done fairly well. He is on a nonrebreather mask but looks comfortable.  Objective: Vital signs in last 24 hours: Temp:  [97.5 F (36.4 C)-99.5 F (37.5 C)] 97.5 F (36.4 C) (05/29 0000) Pulse Rate:  [28-125] 98 (05/29 0700) Resp:  [8-30] 19 (05/29 0700) BP: (83-195)/(49-123) 135/71 mmHg (05/29 0700) SpO2:  [67 %-100 %] 91 % (05/29 0700) FiO2 (%):  [40 %-100 %] 100 % (05/29 0600) Weight:  [64.3 kg (141 lb 12.1 oz)] 64.3 kg (141 lb 12.1 oz) (05/29 0500) Weight change: 4.3 kg (9 lb 7.7 oz) Last BM Date: 11/12/13  Intake/Output from previous day: 05/28 0701 - 05/29 0700 In: 2300 [I.V.:460; IV Piggyback:650; TPN:1190] Out: 1125 [Urine:1125]  PHYSICAL EXAM General appearance: He looks comfortable. He doesn't respond very much. Resp: rhonchi bilaterally Cardio: regular rate and rhythm, S1, S2 normal, no murmur, click, rub or gallop GI: soft, non-tender; bowel sounds normal; no masses,  no organomegaly Extremities: extremities normal, atraumatic, no cyanosis or edema  Lab Results:    Basic Metabolic Panel:  Recent Labs  11/16/13 0721 11/17/13 0423 2013-12-07 0436  NA  --  140 143  K  --  3.3* 6.1*  CL  --  97 103  CO2  --  31 34*  GLUCOSE  --  123* 171*  BUN  --  14 22  CREATININE  --  1.02 1.10  CALCIUM  --  8.0* 8.2*  MG 1.5 1.6  --   PHOS  --  3.1  --    Liver Function Tests:  Recent Labs  11/16/13 0400 11/17/13 0423  AST 19 37  ALT 11 22  ALKPHOS 38* 53  BILITOT 0.3 0.6  PROT 4.5* 5.5*  ALBUMIN 1.7* 2.2*   No results found for this basename: LIPASE, AMYLASE,  in the last 72 hours No results found for this basename: AMMONIA,  in the last 72 hours CBC:  Recent Labs  11/17/13 0837 07-Dec-2013 0436  WBC 9.0 11.8*  HGB 11.0* 12.0*  HCT 33.4* 41.4  MCV 81.9 91.8  PLT 186 236   Cardiac Enzymes: No results found for this basename: CKTOTAL, CKMB, CKMBINDEX, TROPONINI,  in the last 72  hours BNP: No results found for this basename: PROBNP,  in the last 72 hours D-Dimer: No results found for this basename: DDIMER,  in the last 72 hours CBG:  Recent Labs  11/17/13 1152 11/17/13 1634 11/17/13 2010 11/17/13 2332 07-Dec-2013 0432 2013-12-07 0726  GLUCAP 149* 100* 167* 171* 131* 107*   Hemoglobin A1C: No results found for this basename: HGBA1C,  in the last 72 hours Fasting Lipid Panel: No results found for this basename: CHOL, HDL, LDLCALC, TRIG, CHOLHDL, LDLDIRECT,  in the last 72 hours Thyroid Function Tests: No results found for this basename: TSH, T4TOTAL, FREET4, T3FREE, THYROIDAB,  in the last 72 hours Anemia Panel: No results found for this basename: VITAMINB12, FOLATE, FERRITIN, TIBC, IRON, RETICCTPCT,  in the last 72 hours Coagulation: No results found for this basename: LABPROT, INR,  in the last 72 hours Urine Drug Screen: Drugs of Abuse  No results found for this basename: labopia, cocainscrnur, labbenz, amphetmu, thcu, labbarb    Alcohol Level: No results found for this basename: ETH,  in the last 72 hours Urinalysis: No results found for this basename: COLORURINE, APPERANCEUR, LABSPEC, PHURINE, GLUCOSEU, HGBUR, BILIRUBINUR, KETONESUR, PROTEINUR, UROBILINOGEN, NITRITE, LEUKOCYTESUR,  in the last 72 hours Misc. Labs:  ABGS  Recent  Labs  11/17/13 0515  PHART 7.487*  PO2ART 128.0*  TCO2 27.1  HCO3 30.5*   CULTURES Recent Results (from the past 240 hour(s))  MRSA PCR SCREENING     Status: None   Collection Time    11/09/13  6:15 PM      Result Value Ref Range Status   MRSA by PCR NEGATIVE  NEGATIVE Final   Comment:            The GeneXpert MRSA Assay (FDA     approved for NASAL specimens     only), is one component of a     comprehensive MRSA colonization     surveillance program. It is not     intended to diagnose MRSA     infection nor to guide or     monitor treatment for     MRSA infections.  CULTURE, BLOOD (ROUTINE X 2)      Status: None   Collection Time    11/09/13 10:47 PM      Result Value Ref Range Status   Specimen Description BLOOD LEFT WRIST   Final   Special Requests     Final   Value: BOTTLES DRAWN AEROBIC AND ANAEROBIC AEB=6CC ANA=4CC   Culture NO GROWTH 5 DAYS   Final   Report Status 11/14/2013 FINAL   Final  CULTURE, BLOOD (ROUTINE X 2)     Status: None   Collection Time    11/09/13 10:47 PM      Result Value Ref Range Status   Specimen Description BLOOD CENTRAL LINE DRAWN BY RN   Final   Special Requests BOTTLES DRAWN AEROBIC AND ANAEROBIC [place]   Final   Culture NO GROWTH 5 DAYS   Final   Report Status 11/14/2013 FINAL   Final  CULTURE, EXPECTORATED SPUTUM-ASSESSMENT     Status: None   Collection Time    11/15/13  8:38 PM      Result Value Ref Range Status   Specimen Description SPUTUM   Final   Special Requests NONE   Final   Sputum evaluation     Final   Value: THIS SPECIMEN IS ACCEPTABLE. RESPIRATORY CULTURE REPORT TO FOLLOW.     PERFORMED AT [facility]   Report Status 11/15/2013 FINAL   Final  CULTURE, RESPIRATORY (NON-EXPECTORATED)     Status: None   Collection Time    11/15/13  8:38 PM      Result Value Ref Range Status   Specimen Description TRACHEAL ASPIRATE   Final   Special Requests NONE   Final   Gram Stain     Final   Value: ABUNDANT WBC PRESENT,BOTH PMN AND MONONUCLEAR     RARE SQUAMOUS EPITHELIAL CELLS PRESENT     RARE GRAM NEGATIVE RODS     Performed at [facility]   Culture     Final   Value: ABUNDANT STENOTROPHOMONAS MALTOPHILIA     Performed at [facility]   Report Status November 26, 2013 FINAL   Final   Organism ID, Bacteria STENOTROPHOMONAS MALTOPHILIA   Final   Studies/Results: Dg Chest Port 1 View  11/16/2013   CLINICAL DATA:  Respiratory failure l  EXAM: PORTABLE CHEST - 1 VIEW  COMPARISON:  Portable chest x-ray of 11/15/2013  FINDINGS: There has been worsening of diffuse airspace disease bilaterally most consistent with edema. Pneumonia cannot  be excluded. No effusion is seen. The endotracheal tube tip is difficult to visualize, approximately 3.2 cm above the carina. Right central venous  line tip overlies the lower SVC. The heart is unchanged in size.  IMPRESSION: 1. Worsening of diffuse airspace disease most consistent with edema. 2. No change in position of endotracheal tube and right central venous line.   Electronically Signed   By: Ivar Drape M.D.   On: 11/16/2013 08:21    Medications:  Prior to Admission:  Prescriptions prior to admission  Medication Sig Dispense Refill  . acetaminophen (TYLENOL) 325 MG tablet Take 2 tablets (650 mg total) by mouth every 6 (six) hours as needed for mild pain (or Fever >/= 101).      Marland Kitchen amLODipine-benazepril (LOTREL) 5-40 MG per capsule Take 1 capsule by mouth daily.      . carvedilol (COREG) 3.125 MG tablet Take 3.125 mg by mouth daily.      . simvastatin (ZOCOR) 20 MG tablet Take 20 mg by mouth every evening.       Scheduled: . antiseptic oral rinse  15 mL Mouth Rinse QID  . artificial tears   Both Eyes Q6H  . chlorhexidine  15 mL Mouth Rinse BID  . enoxaparin (LOVENOX) injection  40 mg Subcutaneous Q24H  . famotidine (PEPCID) IV  20 mg Intravenous Q12H  . furosemide  20 mg Intravenous Daily  . insulin aspart  0-15 Units Subcutaneous 6 times per day  . metronidazole  500 mg Intravenous Q8H  . sodium chloride  10-40 mL Intracatheter Q12H  . vancomycin  750 mg Intravenous Q12H   Continuous: . Marland KitchenTPN (CLINIMIX-E) Adult 60 mL/hr at 10/25/2013 0700   And  . fat emulsion 250 mL (11/04/2013 0700)  . fentaNYL infusion INTRAVENOUS 100 mcg/hr (10/27/2013 0700)  . phenylephrine (NEO-SYNEPHRINE) Adult infusion 2.5 mcg/min (11/17/13 0700)   WCB:[id number], acetaminophen, albuterol, fentaNYL, fentaNYL, fentaNYL, LORazepam, LORazepam, morphine injection, naLOXone (NARCAN)  injection, ondansetron, sodium chloride, sodium chloride  Assesment: He was admitted with small bowel obstruction and after  admission aspirated developed aspiration pneumonia and septic shock. He has pseudomembranous colitis. He has been encephalopathic. He has been anemic. The focus of our care has changed to continuing with antibiotics etc. but more focused on comfort. Principal Problem:   Septic shock Active Problems:   SBO (small bowel obstruction)   HTN (hypertension), benign   Acute respiratory failure with hypoxia   Acute encephalopathy   Aspiration pneumonia   Small bowel obstruction   Protein-calorie malnutrition, severe   Thrombocytopenia, unspecified   Pseudomembranous colitis   Hypoglycemia due to insulin    Plan: Continue with current medications and treatments.    LOS: 10 days   Douglas Mckay 11/06/2013, 8:06 AM

## 2013-11-21 NOTE — Progress Notes (Signed)
[place] NOTE  Pharmacy Consult for TPN Indication: SBO, malnutrition, hypoalbuminemia  Allergies  Allergen Reactions  . Solu-Medrol [Methylprednisolone Acetate] Other (See Comments)    Severe encephalopathy/delirium.   Patient Measurements: Height: 5\' 1"  (154.9 cm) Weight: 141 lb 12.1 oz (64.3 kg) IBW/kg (Calculated) : 52.3  Vital Signs: Temp: 97.9 F (36.6 C) (05/29 0800) Temp src: Axillary (05/29 0800) BP: 105/52 mmHg (05/29 0930) Pulse Rate: 95 (05/29 0930) Intake/Output from previous day: 05/28 0701 - 05/29 0700 In: 2300 [I.V.:460; IV Piggyback:650; TPN:1190] Out: 1125 [Urine:1125] Intake/Output from this shift:    Labs:  Recent Labs  11/16/13 0400 11/16/13 1610 11/17/13 0837 10/29/2013 0436  WBC 8.0  --  9.0 11.8*  HGB 7.7* 8.7* 11.0* 12.0*  HCT 23.8* 26.7* 33.4* 41.4  PLT 131*  --  186 236    Recent Labs  11/16/13 0400 11/16/13 0721 11/17/13 0423 10/25/2013 0436 11/09/2013 0745  NA 139  --  140 143  --   K 3.1*  --  3.3* 6.1* 6.0*  CL 102  --  97 103  --   CO2 29  --  31 34*  --   GLUCOSE 147*  --  123* 171*  --   BUN 12  --  14 22  --   CREATININE 0.97  --  1.02 1.10  --   CALCIUM 7.6*  --  8.0* 8.2*  --   MG  --  1.5 1.6  --   --   PHOS  --   --  3.1  --   --   PROT 4.5*  --  5.5*  --   --   ALBUMIN 1.7*  --  2.2*  --   --   AST 19  --  37  --   --   ALT 11  --  22  --   --   ALKPHOS 38*  --  53  --   --   BILITOT 0.3  --  0.6  --   --    Estimated Creatinine Clearance: 44 ml/min (by C-G formula based on Cr of 1.1).    Recent Labs  11/17/13 2332 11/02/2013 0432 10/30/2013 0726  GLUCAP 171* 131* 107*   Medical History: Past Medical History  Diagnosis Date  . Hypertension   . Hypercholesterolemia   . Cancer     prostate   Medications:  Scheduled:  . antiseptic oral rinse  15 mL Mouth Rinse QID  . artificial tears   Both Eyes Q6H  . chlorhexidine  15 mL Mouth Rinse BID  . enoxaparin (LOVENOX) injection  40 mg  Subcutaneous Q24H  . famotidine (PEPCID) IV  20 mg Intravenous Q12H  . furosemide  20 mg Intravenous Daily  . insulin aspart  0-15 Units Subcutaneous 6 times per day  . levofloxacin (LEVAQUIN) IV  500 mg Intravenous Q24H  . metronidazole  500 mg Intravenous Q8H  . sodium bicarbonate  50 mEq Intravenous Once  . sodium chloride  10-40 mL Intracatheter Q12H  . vancomycin  750 mg Intravenous Q12H   Insulin Requirements in the past 24 hours:  5 units  Current Nutrition:  titrating TPN  Assessment: 80yo M admitted with septic shock most likely related to aspiration pneumonia possibly secondary to SBO.  He was extubated 5/28 with no plans to re-intubate if required.  Severe malnutrition, hypoalbuminemia noted.  Prealbumin = 5 Renal function is stable.   Potassium is elevated today.  No hx of DM, but  blood glucose became elevated which required SSI.  A small amount of insulin was added to TPN and pt had 2 episodes of hypoglycemia.  TPN was held for a short time then resumed.  Blood glucose has been fine since.  Dietician note reviewed and appreciated.   Pt is now a DNR but asked to continue TPN until evaluation can be made regarding progress off vent.      Nutritional Goals:  (based on wt of 64Kg in critically ill pt)  Kcal: 1550-1700 daily  Protein: 90-105 grams daily  Fluid: 1.5-1.7 L daily Goal rate for TPN = 75 ml/hr based on tolerance and progress  Plan:   Change to Clinimix 5/15 at 21ml/hr today  (Hold electrolytes today since potassium elevated).  Add regular insulin 10 units to TPN bag   Add MVI, Trace Elements to TPN daily  Lipids daily with TPN  Continue SSI with CBGs q4h     Monitor lytes, renal fxn, CBGs, Prealbumin and fluid status  D/C TPN if pt converts to comfort care only.    Lavonia Drafts Nikitha Mode 11/10/2013,10:35 AM

## 2013-11-21 DEATH — deceased

## 2013-11-22 NOTE — Discharge Summary (Signed)
Death Summary  Germain Koopmann HER:[id number] DOB: 1932-03-25 DOA: 11/23/2013  PCP: Marjo Bicker, MD PCP/Office notified:   Admit date: 11-23-13 Date of Death: 2013/12/07  Final Diagnoses:   1. Septic shock associated with aspiration pneumonia and severe rectosigmoid colitis, possibly pseudomembranous colitis. 2. Acute ventilator dependent respiratory failure, secondary to pneumonia. 3. Small bowel obstruction with right inguinal hernia. 4. Severe protein calorie malnutrition and hypoalbuminemia. 5. Intermittent hyperkalemia and hypokalemia. 6. Anemia of acute disease, status post packed red blood cell transfusions. 7. Anasarca, secondary to volume resuscitation. 2-D echocardiogram revealed a normal left ventricular ejection fraction. 8. Hyperglycemia/hypoglycemia associated with TPN and insulin therapy.    History of present illness:   Patient was a 80 year old black male who presented to the emergency room with worsening abdominal pain, distention, nausea, and vomiting. He was initially admitted by general surgery.  The patient stated that he had a known right inguinal hernia for some time. He was somewhat a poor historian. CT scan in the emergency room revealed a small bowel obstruction secondary to his right inguinal hernia. An NG tube was placed and the patient was admitted to the hospital for further evaluation and treatment.     Hospital Course:   This is a 80 year old gentleman who was admitted to the hospital with abdominal pain, distention, nausea and vomiting and found to have a small bowel obstruction with a right inguinal hernia. He was initially admitted to to a medical bed on Dr. Arnoldo Morale' service. NG tube was placed for decompression. Shortly after admission, the patient became increasingly tachypneic, hypoxic and less responsive. He was placed on BiPAP therapy, but was not appearing to tolerate this. Ultimately, he required transfer to the ICU, with intubation for  airway management and severe hypoxia. The hospitalist service assumed care as his primary attending. Dr. Luan Pulling was consulted to assist with respiratory failure management. The patient's chest x-ray indicated aspiration pneumonia. He was started on empiric antibiotics. He subsequently went into septic shock requiring IV fluids and vasopressors. His blood pressure waxed and waned requiring on again-off again vasopressors. He was continued on antibiotics. Weaning from the ventilator was difficult due to his thick secretions and agitation during the weaning process. Daily weaning trials were attempted. Dr. Arnoldo Morale continued to follow the patient for small bowel obstruction, but potential surgical repair was postponed until he stabilized from a pulmonary standpoint.  In the interim, the patient became hypotensive again and developed a low-grade fever. Followup CT of his abdomen/pelvis on 5/26 revealed severe new onset of thickening of the rectosigmoid colon suggesting pseudomembranous colitis. Due to this finding, Zosyn was discontinued in favor of metronidazole which would also cover aspiration pneumonia. He was continued on vancomycin. He was restarted on Neo-Synephrine and given 2 units of packed red blood cells for blood pressure support on 5/27. His hemoglobin fell to 7.7 without any obvious source of bleeding. The decrease in hemoglobin was felt to be secondary to severe illness. Because of his poor prognosis and clinical decline despite extensive treatment and because he had been mechanically ventilated for more than one week without inprovement, Dr. Luan Pulling and I had a conversation with the patient's family regarding his poor prognosis and potential need for a tracheostomy if aggressive management was still desired. Following our conversation, it was decided that the patient would be (terminally) extubated on 5/28 without reintubation. He was extubated and given post-extubation supportive care. On 12/03/13, he  expired.     Signed:  Rexene Alberts  [facility] 12/07/2013, 6:29 PM

## 2015-08-18 IMAGING — CT CT ABD-PELV W/ CM
3 of 6 series · 15 of 46 positions shown, 17 images · IV contrast (Omnipaque 300)
Comparison: CT of the abdomen and pelvis 07/25/2013.

CLINICAL DATA: Epigastric pain, nausea and vomiting and mid back
pain. History prostate cancer status post prostatectomy.

EXAM:
CT ABDOMEN AND PELVIS WITH CONTRAST
TECHNIQUE: Multidetector CT imaging of the abdomen and pelvis was performed
using the standard protocol following bolus administration of
intravenous contrast.
CONTRAST:  50mL OMNIPAQUE IOHEXOL 300 MG/ML SOLN, 100mL OMNIPAQUE
IOHEXOL 300 MG/ML SOLN

[Series 2: abd_pel_with 5.0 b40f · axial · 0.63mm/px · z∈[-331,-11]mm · 10 of 80 slices shown, 12 images]
[im 8/80  soft-tissue]
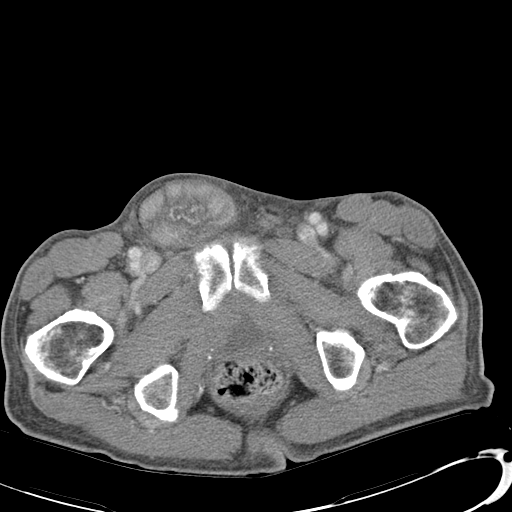
[im 8/80  bone]
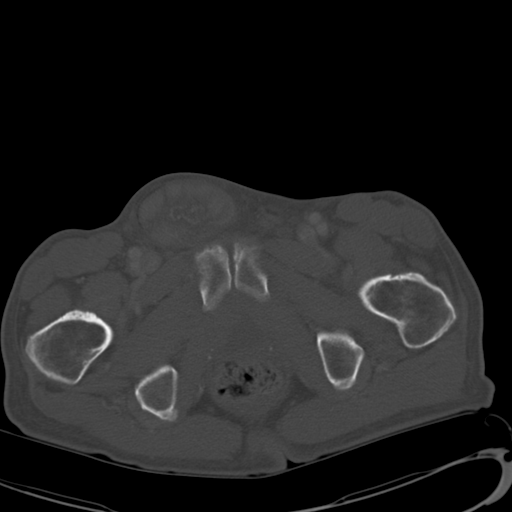
[im 15/80  soft-tissue]
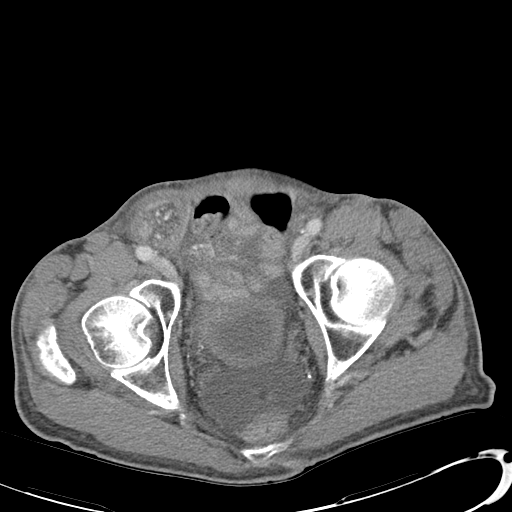
[im 22/80  soft-tissue]
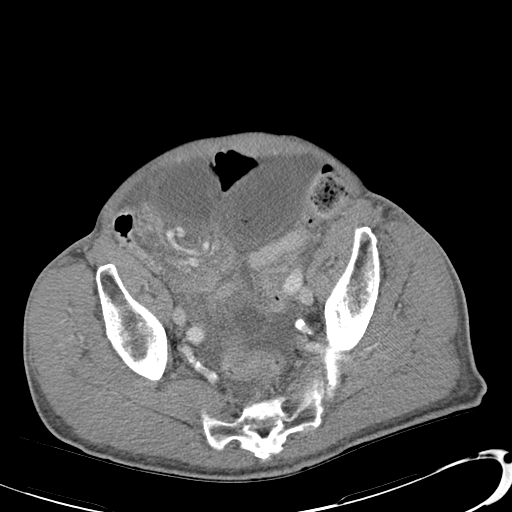
[im 29/80  soft-tissue]
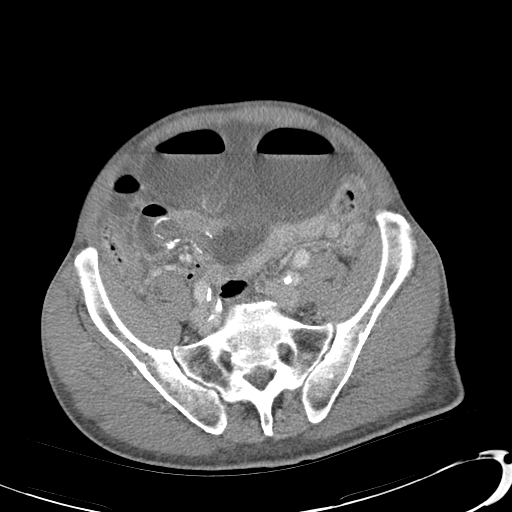
[im 36/80  soft-tissue]
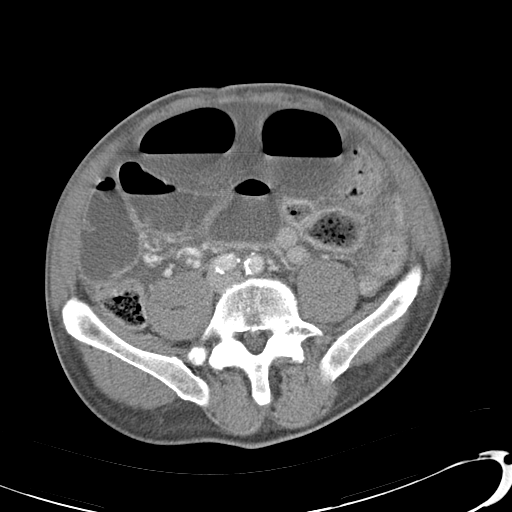
[im 44/80  soft-tissue]
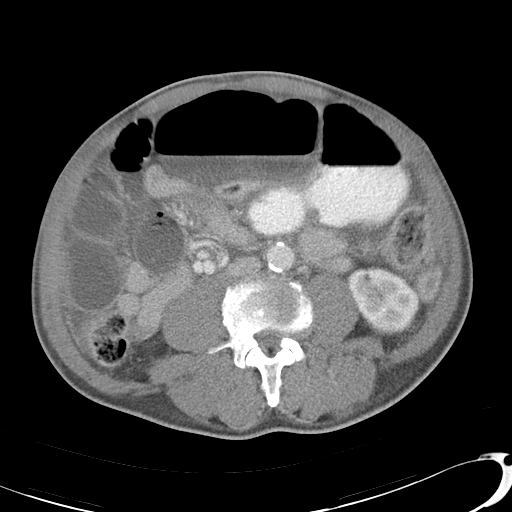
[im 51/80  soft-tissue]
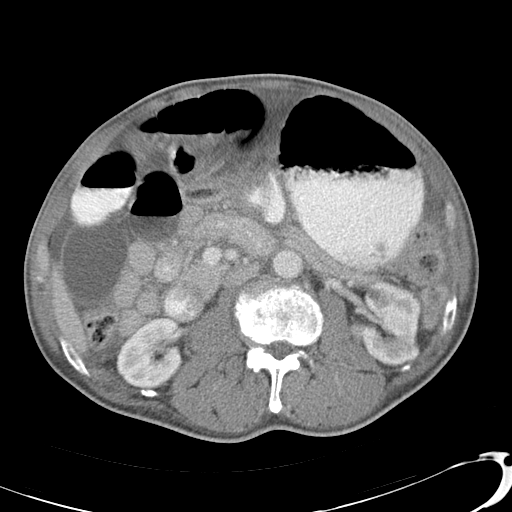
[im 58/80  soft-tissue]
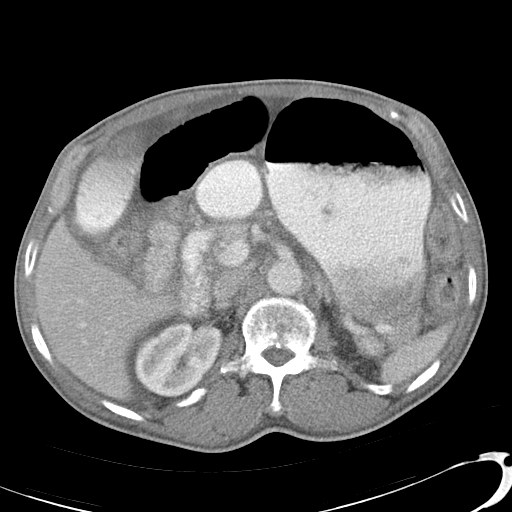
[im 65/80  soft-tissue]
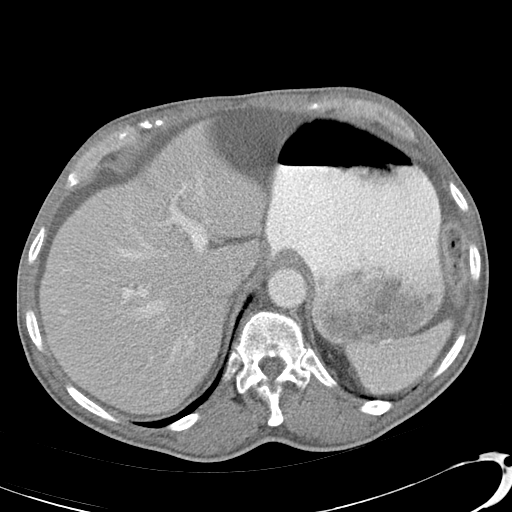
[im 65/80  bone]
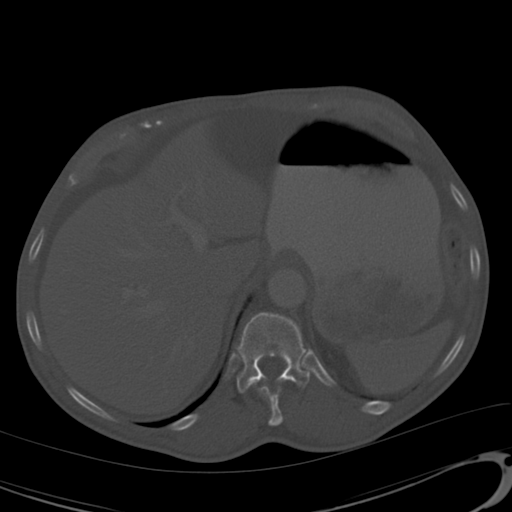
[im 72/80  soft-tissue]
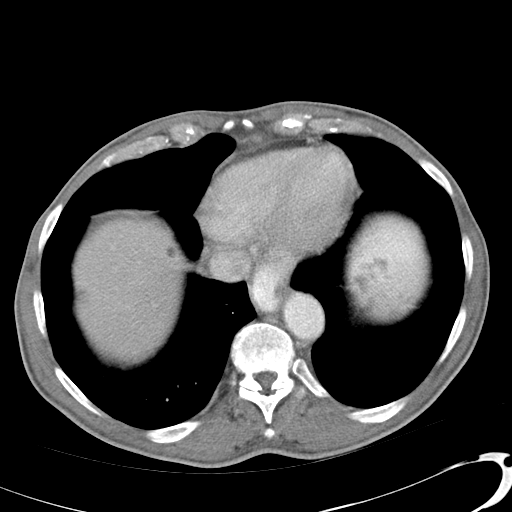

[Series 4: abd_pel_with 3.0 spo · coronal · 0.59mm/px · 3 of 78 slices shown]
[im 26/78  soft-tissue]
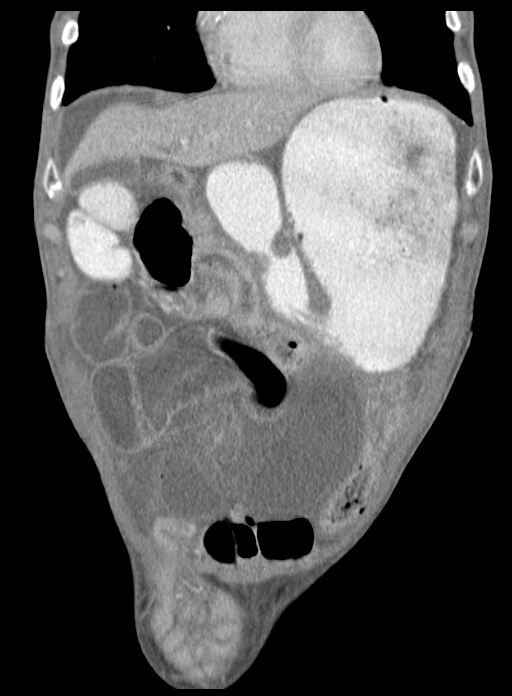
[im 35/78  soft-tissue]
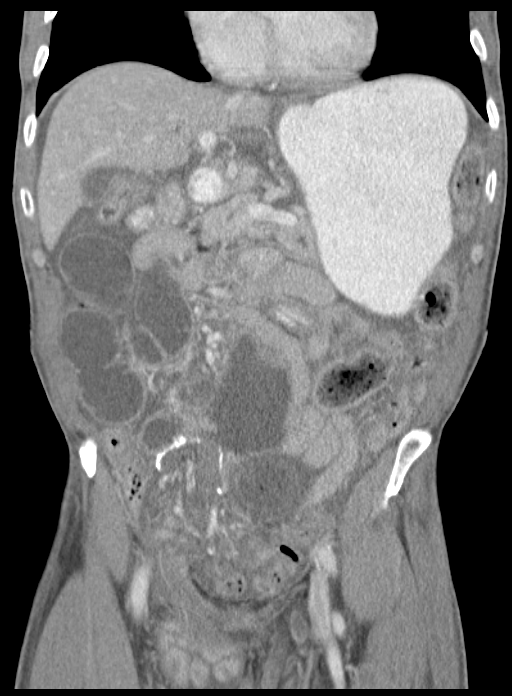
[im 43/78  soft-tissue]
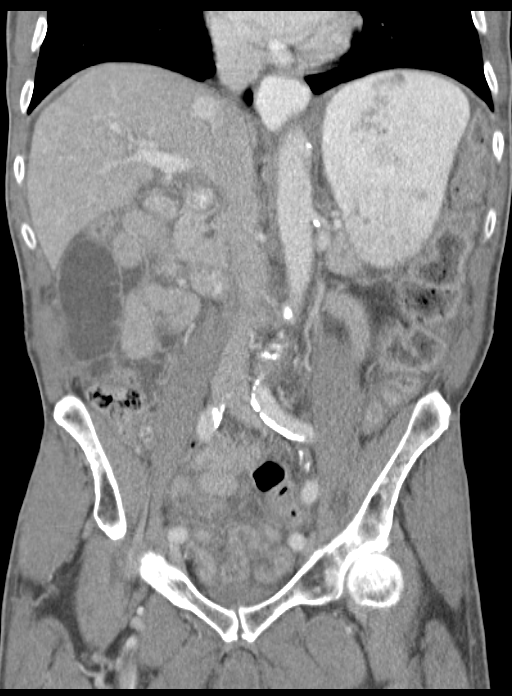

[Series 7: delay kidney · axial · delayed · 0.59mm/px · z∈[-384,-349]mm · 2 of 80 slices shown]
[im 8/80  soft-tissue]
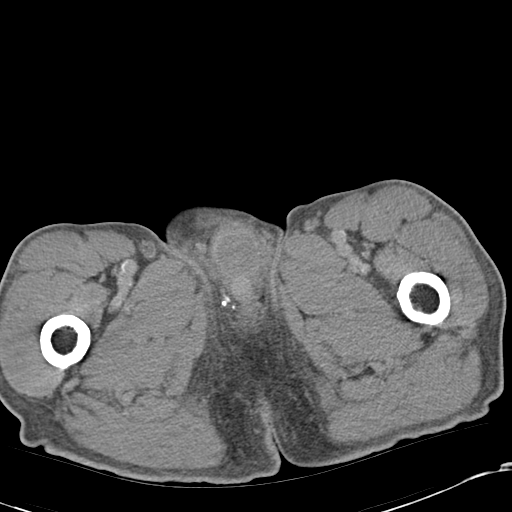
[im 15/80  soft-tissue]
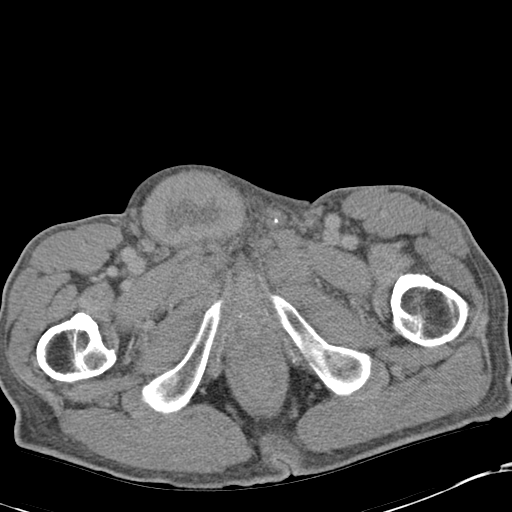

[15 of 46 positions shown; findings below may reference images not displayed]

FINDINGS: Lung Bases: Patulous fluid-filled distal esophagus. Atherosclerotic
calcifications in the right coronary artery.

Abdomen/Pelvis: Large right inguinal hernia containing multiple
loops of small bowel. Proximal to this there are multiple dilated
loops of small bowel which measure up to 5.8 cm in diameter, with
multiple air-fluid levels, compatible with bowel obstruction.
Although the mucosa of some of these bowel loops appears to enhance
normally, the mucosa of some other bowel loops fail to enhance,
which could suggest some bowel ischemia. No definite pneumatosis is
noted at this time. No portal venous gas. Small volume of ascites
and interloop fluid. No pneumoperitoneum. A suture line is noted in
the right side of the abdomen, presumably from prior partial small
bowel resection. Some gas and stool is noted throughout the colon,
indicating that this obstruction is likely new. Stomach is
moderately distended.

There are multiple sub cm low-attenuation lesions scattered
throughout the liver which are too small to characterize, but are
favored to represent tiny cysts. Gallbladder is unremarkable in
appearance. The appearance of the pancreas, spleen and bilateral
adrenal glands is unremarkable. Numerous sub cm low-attenuation
lesions scattered throughout the kidneys bilaterally are too small
to definitively characterize, but are favored to represent tiny
cysts. Atherosclerosis throughout the abdominal and pelvic
vasculature, without evidence of aneurysm or dissection. No definite
lymphadenopathy identified within the abdomen or pelvis. Status post
prostatectomy. Urinary bladder is unremarkable in appearance.

Musculoskeletal: There are no aggressive appearing lytic or blastic
lesions noted in the visualized portions of the skeleton.
IMPRESSION: 1. Right inguinal hernia with associated small bowel obstruction,
and decreased enhancement of the mucosa of several dilated small
bowel loops, which is suggestive of early changes of bowel ischemia.
There is a small volume of ascites and interloop fluid, which is
presumably reactive. No pneumoperitoneum to strongly suggest frank
perforation at this time. Immediate surgical consultation is
strongly recommended.
2. Additional incidental findings, as above.
Critical Value/emergent results were called by telephone at the time
of interpretation on 11/08/2013 at [DATE] to Dr. RISINHA SAULO, who
verbally acknowledged these results.
